# Patient Record
Sex: Male | Born: 1965 | Race: White | Hispanic: No | Marital: Single | Smoking: Never smoker
Health system: Southern US, Community
[De-identification: ages and names within clinical notes are randomized; demographics above are authoritative.]

## PROBLEM LIST (undated history)

## (undated) DIAGNOSIS — I4892 Unspecified atrial flutter: Secondary | ICD-10-CM

## (undated) DIAGNOSIS — I219 Acute myocardial infarction, unspecified: Secondary | ICD-10-CM

## (undated) DIAGNOSIS — M109 Gout, unspecified: Secondary | ICD-10-CM

## (undated) DIAGNOSIS — I10 Essential (primary) hypertension: Secondary | ICD-10-CM

## (undated) DIAGNOSIS — E139 Other specified diabetes mellitus without complications: Secondary | ICD-10-CM

## (undated) HISTORY — DX: Unspecified atrial flutter: I48.92

---

## 2018-12-29 ENCOUNTER — Emergency Department: Payer: BLUE CROSS/BLUE SHIELD

## 2018-12-29 ENCOUNTER — Inpatient Hospital Stay
Admission: EM | Admit: 2018-12-29 | Discharge: 2019-01-01 | DRG: 501 | Disposition: A | Discharge status: Home / Self Care | Payer: BLUE CROSS/BLUE SHIELD | Source: Ambulatory Visit | Attending: Internal Medicine | Admitting: Internal Medicine

## 2018-12-29 ENCOUNTER — Other Ambulatory Visit: Payer: Self-pay

## 2018-12-29 ENCOUNTER — Encounter: Payer: Self-pay | Admitting: Emergency Medicine

## 2018-12-29 ENCOUNTER — Inpatient Hospital Stay: Payer: BLUE CROSS/BLUE SHIELD

## 2018-12-29 DIAGNOSIS — M25422 Effusion, left elbow: Secondary | ICD-10-CM | POA: Diagnosis present

## 2018-12-29 DIAGNOSIS — Z833 Family history of diabetes mellitus: Secondary | ICD-10-CM | POA: Diagnosis not present

## 2018-12-29 DIAGNOSIS — Z888 Allergy status to other drugs, medicaments and biological substances status: Secondary | ICD-10-CM

## 2018-12-29 DIAGNOSIS — L0390 Cellulitis, unspecified: Secondary | ICD-10-CM | POA: Diagnosis present

## 2018-12-29 DIAGNOSIS — M7022 Olecranon bursitis, left elbow: Secondary | ICD-10-CM | POA: Diagnosis not present

## 2018-12-29 DIAGNOSIS — E119 Type 2 diabetes mellitus without complications: Secondary | ICD-10-CM | POA: Diagnosis not present

## 2018-12-29 DIAGNOSIS — M10022 Idiopathic gout, left elbow: Secondary | ICD-10-CM | POA: Diagnosis present

## 2018-12-29 DIAGNOSIS — I10 Essential (primary) hypertension: Secondary | ICD-10-CM | POA: Diagnosis present

## 2018-12-29 DIAGNOSIS — L02414 Cutaneous abscess of left upper limb: Secondary | ICD-10-CM | POA: Diagnosis present

## 2018-12-29 DIAGNOSIS — Z7984 Long term (current) use of oral hypoglycemic drugs: Secondary | ICD-10-CM | POA: Diagnosis not present

## 2018-12-29 DIAGNOSIS — L03114 Cellulitis of left upper limb: Secondary | ICD-10-CM | POA: Diagnosis not present

## 2018-12-29 HISTORY — DX: Other specified diabetes mellitus without complications: E13.9

## 2018-12-29 HISTORY — DX: Gout, unspecified: M10.9

## 2018-12-29 HISTORY — DX: Essential (primary) hypertension: I10

## 2018-12-29 LAB — CBC
HCT: 37.5 % — ABNORMAL LOW (ref 39.0–52.0)
Hemoglobin: 12.7 g/dL — ABNORMAL LOW (ref 13.0–17.0)
MCH: 29 pg (ref 26.0–34.0)
MCHC: 33.9 g/dL (ref 30.0–36.0)
MCV: 85.6 fL (ref 80.0–100.0)
Platelets: 266 10*3/uL (ref 150–400)
RBC: 4.38 MIL/uL (ref 4.22–5.81)
RDW: 12.6 % (ref 11.5–15.5)
WBC: 8.6 10*3/uL (ref 4.0–10.5)
nRBC: 0 % (ref 0.0–0.2)

## 2018-12-29 LAB — BASIC METABOLIC PANEL
Anion gap: 13 (ref 5–15)
BUN: 15 mg/dL (ref 6–20)
CO2: 24 mmol/L (ref 22–32)
Calcium: 8.7 mg/dL — ABNORMAL LOW (ref 8.9–10.3)
Chloride: 96 mmol/L — ABNORMAL LOW (ref 98–111)
Creatinine, Ser: 1.15 mg/dL (ref 0.61–1.24)
GFR calc Af Amer: >60 mL/min (ref >60)
GFR calc non Af Amer: >60 mL/min (ref >60)
Glucose, Bld: 202 mg/dL — ABNORMAL HIGH (ref 70–99)
Potassium: 4.1 mmol/L (ref 3.5–5.1)
Sodium: 133 mmol/L — ABNORMAL LOW (ref 135–145)

## 2018-12-29 LAB — SEDIMENTATION RATE: Sed Rate: 88 mm/hr — ABNORMAL HIGH (ref 0–20)

## 2018-12-29 LAB — PROTIME-INR
INR: 1 (ref 0.8–1.2)
Prothrombin Time: 13.3 seconds (ref 11.4–15.2)

## 2018-12-29 LAB — LACTIC ACID, PLASMA
Lactic Acid, Venous: 0.9 mmol/L (ref 0.5–1.9)
Lactic Acid, Venous: 1 mmol/L (ref 0.5–1.9)

## 2018-12-29 LAB — SYNOVIAL CELL COUNT + DIFF, W/ CRYSTALS
Eosinophils-Synovial: 0 %
Lymphocytes-Synovial Fld: 6 %
Monocyte-Macrophage-Synovial Fluid: 3 %
Neutrophil, Synovial: 91 %
WBC, Synovial: 28819 /mm3 — ABNORMAL HIGH (ref 0–200)

## 2018-12-29 LAB — URIC ACID: Uric Acid, Serum: 6.3 mg/dL (ref 3.7–8.6)

## 2018-12-29 MED ORDER — ALLOPURINOL 100 MG PO TABS
100.0000 mg | ORAL_TABLET | Freq: Every day | ORAL | Status: DC
  Administered 2018-12-29 – 2019-01-01 (×4): 100 mg via ORAL
  Filled 2018-12-29 (×4): qty 1

## 2018-12-29 MED ORDER — ONDANSETRON HCL 4 MG PO TABS: 4.0000 mg | ORAL_TABLET | Freq: Four times a day (QID) | ORAL | Status: DC | PRN

## 2018-12-29 MED ORDER — SODIUM CHLORIDE 0.9% FLUSH: 3.0000 mL | INTRAVENOUS | Status: DC | PRN

## 2018-12-29 MED ORDER — SODIUM CHLORIDE 0.9 % IV BOLUS
1000.0000 mL | Freq: Once | INTRAVENOUS | Status: AC
  Administered 2018-12-29: 1000 mL via INTRAVENOUS

## 2018-12-29 MED ORDER — MORPHINE SULFATE (PF) 4 MG/ML IV SOLN
4.0000 mg | Freq: Once | INTRAVENOUS | Status: AC
  Administered 2018-12-29: 4 mg via INTRAVENOUS
  Filled 2018-12-29: qty 1

## 2018-12-29 MED ORDER — LIDOCAINE HCL (PF) 1 % IJ SOLN
5.0000 mL | Freq: Once | INTRAMUSCULAR | Status: AC
  Administered 2018-12-29: 5 mL
  Filled 2018-12-29: qty 5

## 2018-12-29 MED ORDER — VANCOMYCIN HCL IN DEXTROSE 1-5 GM/200ML-% IV SOLN
1000.0000 mg | Freq: Once | INTRAVENOUS | Status: AC
  Administered 2018-12-29: 1000 mg via INTRAVENOUS
  Filled 2018-12-29: qty 200

## 2018-12-29 MED ORDER — METHYLPREDNISOLONE SODIUM SUCC 125 MG IJ SOLR
60.0000 mg | Freq: Every day | INTRAMUSCULAR | Status: DC
  Administered 2018-12-29 – 2018-12-30 (×2): 60 mg via INTRAVENOUS
  Filled 2018-12-29 (×2): qty 2

## 2018-12-29 MED ORDER — ACETAMINOPHEN 650 MG RE SUPP: 650.0000 mg | Freq: Four times a day (QID) | RECTAL | Status: DC | PRN

## 2018-12-29 MED ORDER — GADOBUTROL 1 MMOL/ML IV SOLN
10.0000 mL | Freq: Once | INTRAVENOUS | Status: AC | PRN
  Administered 2018-12-29: 10 mL via INTRAVENOUS
  Filled 2018-12-29: qty 10

## 2018-12-29 MED ORDER — SODIUM CHLORIDE 0.9 % IV SOLN
250.0000 mL | INTRAVENOUS | Status: DC | PRN
  Administered 2018-12-30: 05:00:00 250 mL via INTRAVENOUS
  Administered 2018-12-30: 13:00:00 via INTRAVENOUS

## 2018-12-29 MED ORDER — ACETAMINOPHEN 325 MG PO TABS: 650.0000 mg | ORAL_TABLET | Freq: Four times a day (QID) | ORAL | Status: DC | PRN

## 2018-12-29 MED ORDER — HYDROCODONE-ACETAMINOPHEN 5-325 MG PO TABS
1.0000 | ORAL_TABLET | ORAL | Status: DC | PRN
  Administered 2018-12-29: 1 via ORAL
  Administered 2018-12-30 – 2019-01-01 (×6): 2 via ORAL
  Filled 2018-12-29 (×4): qty 2
  Filled 2018-12-29: qty 1
  Filled 2018-12-29 (×2): qty 2

## 2018-12-29 MED ORDER — ONDANSETRON HCL 4 MG/2ML IJ SOLN: 4.0000 mg | Freq: Four times a day (QID) | INTRAMUSCULAR | Status: DC | PRN

## 2018-12-29 MED ORDER — ONDANSETRON HCL 4 MG/2ML IJ SOLN
4.0000 mg | Freq: Once | INTRAMUSCULAR | Status: AC
  Administered 2018-12-29: 4 mg via INTRAVENOUS
  Filled 2018-12-29: qty 2

## 2018-12-29 MED ORDER — SENNOSIDES-DOCUSATE SODIUM 8.6-50 MG PO TABS: 1.0000 | ORAL_TABLET | Freq: Every evening | ORAL | Status: DC | PRN

## 2018-12-29 MED ORDER — VANCOMYCIN HCL IN DEXTROSE 1-5 GM/200ML-% IV SOLN
1000.0000 mg | Freq: Two times a day (BID) | INTRAVENOUS | Status: DC
  Administered 2018-12-29: 1000 mg via INTRAVENOUS
  Filled 2018-12-29 (×2): qty 200

## 2018-12-29 MED ORDER — ENOXAPARIN SODIUM 40 MG/0.4ML ~~LOC~~ SOLN
40.0000 mg | SUBCUTANEOUS | Status: DC
  Administered 2018-12-29 – 2018-12-31 (×3): 40 mg via SUBCUTANEOUS
  Filled 2018-12-29 (×3): qty 0.4

## 2018-12-29 MED ORDER — SODIUM CHLORIDE 0.9% FLUSH
3.0000 mL | Freq: Two times a day (BID) | INTRAVENOUS | Status: DC
  Administered 2018-12-29 – 2018-12-31 (×3): 3 mL via INTRAVENOUS

## 2018-12-29 MED ORDER — VANCOMYCIN HCL IN DEXTROSE 1-5 GM/200ML-% IV SOLN: 1000.0000 mg | Freq: Two times a day (BID) | INTRAVENOUS | Status: DC

## 2018-12-29 MED ORDER — MORPHINE SULFATE (PF) 2 MG/ML IV SOLN
2.0000 mg | INTRAVENOUS | Status: DC | PRN
  Administered 2018-12-29: 2 mg via INTRAVENOUS
  Filled 2018-12-29: qty 1

## 2018-12-29 NOTE — Consult Note (Addendum)
Pharmacy Antibiotic Note  Joshua Holder is a 53 y.o. male admitted on 12/29/2018 with cellulitis (elbow infection)    Pharmacy has been consulted for Vancomycin dosing.  Patient will receive a total of 2g loading dose, followed by  Plan: Vancomycin 1000 mg IV Q 12 hrs. Goal AUC 400-550. Expected AUC: 456 SCr used: 1.15   Height: 5\' 11"  (180.3 cm) Weight: 270 lb (122.5 kg) IBW/kg (Calculated) : 75.3  Temp (24hrs), Avg:98.8 F (37.1 C), Min:98.8 F (37.1 C), Max:98.8 F (37.1 C)  Recent Labs  Lab 12/29/18 1648  WBC 8.6  CREATININE 1.15  LATICACIDVEN 0.9    Estimated Creatinine Clearance: 101.3 mL/min (by C-G formula based on SCr of 1.15 mg/dL).    Allergies  Allergen Reactions  . Lisinopril Swelling and Cough    Antimicrobials this admission: Vancomycin 4/20 >>  Dose adjustments this admission: None  Microbiology results: 4/20 BCx: pending  Thank you for allowing pharmacy to be a part of this patient's care.  Albina Billet, PharmD, BCPS Clinical Pharmacist 12/29/2018 6:08 PM

## 2018-12-29 NOTE — Progress Notes (Signed)
Admission from ED. Alert, oriented. Explained admission process. Oriented to room and controls. Understands NPO after 63mn.

## 2018-12-29 NOTE — Progress Notes (Signed)
Advanced care plan. Purpose of the Encounter: CODE STATUS Parties in Attendance: Patient Patient's Decision Capacity: Good Subjective/Patient's story:  Joshua Holder  is a 53 y.o. male with a known history of gout, hypertension presented to the emergency room with pain in the left elbow going on for couple of days.  Patient has some attacks of gout in the past Objective/Medical story Patient is red and swollen left elbow.  Needs evaluation for septic arthritis versus gout flare up.  Needs orthopedic surgery evaluation.  Needs IV antibiotics Goals of care determination:  Advance care directives goals of care and treatment plan discussed Patient wants everything done which includes CPR, intubation ventilator if the need arises CODE STATUS: Full code Time spent discussing advanced care planning: 16 minutes

## 2018-12-29 NOTE — H&P (Addendum)
[facility] [place] [facility]   PATIENT NAME: Joshua Holder    MR#:  [id number]  DATE OF BIRTH:  [birth year]  DATE OF ADMISSION:  12/29/2018  PRIMARY CARE PHYSICIAN: System, Provider Not In   REQUESTING/REFERRING PHYSICIAN:   CHIEF COMPLAINT:   Chief Complaint  Patient presents with  . Arm Pain    HISTORY OF PRESENT ILLNESS: Joshua Holder  is a 53 y.o. male with a known history of gout, hypertension presented to the emergency room with pain in the left elbow going on for couple of days.  Patient has some attacks of gout in the past.  He has swelling and redness and erythema in the left elbow.  Left elbow is aching in nature 8 out of 10 on a scale of 1-10.  He is currently on oral steroids and allopurinol for treatment of gout.  He was also started on oral Septra for possible olecranon bursitis.  He was referred to ER by orthopedic attending from the emergency room.  Patient had arthrocentesis of the left elbow joint by ER physician. No complains of any fever, shortness of breath, cough, sore throat.  No recent travel.  PAST MEDICAL HISTORY:   Past Medical History:  Diagnosis Date  . Gout   . Hypertension     PAST SURGICAL HISTORY: Leg surgery  SOCIAL HISTORY:  No history of smoking Drinks beer occasionally No history of illicit drug use  FAMILY HISTORY: Father deceased Mother has neuropathy and diabetes mellitus  DRUG ALLERGIES:  Allergies  Allergen Reactions  . Lisinopril Swelling and Cough    REVIEW OF SYSTEMS:   CONSTITUTIONAL: No fever, fatigue or weakness.  EYES: No blurred or double vision.  EARS, NOSE, AND THROAT: No tinnitus or ear pain.  RESPIRATORY: No cough, shortness of breath, wheezing or hemoptysis.  CARDIOVASCULAR: No chest pain, orthopnea, edema.  GASTROINTESTINAL: No nausea, vomiting, diarrhea or abdominal pain.  GENITOURINARY: No dysuria, hematuria.  ENDOCRINE: No polyuria, nocturia,  HEMATOLOGY: No anemia, easy  bruising or bleeding SKIN: No rash or lesion. MUSCULOSKELETAL: Left elbow pain noted NEUROLOGIC: No tingling, numbness, weakness.  PSYCHIATRY: No anxiety or depression.   MEDICATIONS AT HOME:  Prior to Admission medications   Not on File      PHYSICAL EXAMINATION:   VITAL SIGNS: Blood pressure (!) 135/91, pulse 86, temperature 98.8 F (37.1 C), temperature source Oral, resp. rate 20, height 5\' 11"  (1.803 m), weight 122.5 kg, SpO2 97 %.  GENERAL:  53 y.o.-year-old patient lying in the bed with no acute distress.  EYES: Pupils equal, round, reactive to light and accommodation. No scleral icterus. Extraocular muscles intact.  HEENT: Head atraumatic, normocephalic. Oropharynx and nasopharynx clear.  NECK:  Supple, no jugular venous distention. No thyroid enlargement, no tenderness.  LUNGS: Normal breath sounds bilaterally, no wheezing, rales,rhonchi or crepitation. No use of accessory muscles of respiration.  CARDIOVASCULAR: S1, S2 normal. No murmurs, rubs, or gallops.  ABDOMEN: Soft, nontender, nondistended. Bowel sounds present. No organomegaly or mass.  EXTREMITIES: No pedal edema, cyanosis, or clubbing.  Left elbow redness noted Swelling and tenderness left elbow noted NEUROLOGIC: Cranial nerves II through XII are intact. Muscle strength 5/5 in all extremities. Sensation intact. Gait not checked.  PSYCHIATRIC: The patient is alert and oriented x 3.  SKIN: redness of skin left elbow noted.     LABORATORY PANEL:   CBC Recent Labs  Lab 12/29/18 1648  WBC 8.6  HGB 12.7*  HCT 37.5*  PLT 266  MCV 85.6  MCH 29.0  MCHC 33.9  RDW 12.6   ------------------------------------------------------------------------------------------------------------------  Chemistries  Recent Labs  Lab 12/29/18 1648  NA 133*  K 4.1  CL 96*  CO2 24  GLUCOSE 202*  BUN 15  CREATININE 1.15  CALCIUM 8.7*    ------------------------------------------------------------------------------------------------------------------ estimated creatinine clearance is 101.3 mL/min (by C-G formula based on SCr of 1.15 mg/dL). ------------------------------------------------------------------------------------------------------------------ No results for input(s): TSH, T4TOTAL, T3FREE, THYROIDAB in the last 72 hours.  Invalid input(s): FREET3   Coagulation profile Recent Labs  Lab 12/29/18 1720  INR 1.0   ------------------------------------------------------------------------------------------------------------------- No results for input(s): DDIMER in the last 72 hours. -------------------------------------------------------------------------------------------------------------------  Cardiac Enzymes No results for input(s): CKMB, TROPONINI, MYOGLOBIN in the last 168 hours.  Invalid input(s): CK ------------------------------------------------------------------------------------------------------------------ Invalid input(s): POCBNP  ---------------------------------------------------------------------------------------------------------------  Urinalysis No results found for: COLORURINE, APPEARANCEUR, LABSPEC, PHURINE, GLUCOSEU, HGBUR, BILIRUBINUR, KETONESUR, PROTEINUR, UROBILINOGEN, NITRITE, LEUKOCYTESUR   RADIOLOGY: Dg Elbow Complete Left  Result Date: 12/29/2018 CLINICAL DATA:  Right arm pain for 5 days. Patient diagnosed with gout 5 days ago. EXAM: LEFT ELBOW - COMPLETE 3+ VIEW COMPARISON:  None. FINDINGS: Soft tissue edema identified in the elbow. A joint effusion is noted with displacement of the anterior posterior fat pads. No fractures are seen. IMPRESSION: Joint effusion in the elbow, likely secondary to the patient's reported gout. Soft tissue edema. No fractures or bony erosion noted. Electronically Signed   By: Gerome Sam III M.D   On: 12/29/2018 17:10    EKG: No orders found  for this or any previous visit.  IMPRESSION AND PLAN: 53 year old male patient with a known history of gout, hypertension presented to the emergency room with pain in the left elbow going on for couple of days.  Patient has history of gout.  -Left elbow cellulitis Versus gout flareup versus septic arthritis Patient had a tap of the left elbow joint in the emergency room Follow-up arthrocentesis fluid results Start patient on IV vancomycin antibiotic Orthopedic surgery evaluation Follow-up cultures Follow-up MRI of the elbow  -Gout Resume allopurinol IV Solu-Medrol 60 mg daily for possible flare up  -Left elbow pain Control pain with oral narcotics and PRN IV morphine  -DVT prophylaxis subcu Lovenox daily  All the records are reviewed and case discussed with ED provider. Management plans discussed with the patient, family and they are in agreement.  CODE STATUS:Full code  TOTAL TIME TAKING CARE OF THIS PATIENT:52 minutes.    [name] M.D on 12/29/2018 at 5:57 PM  Between 7am to 6pm - Pager - [phone]  After 6pm go to [email or web address] - password [email or web address]  [facility]  Office  [phone]  CC: Primary care physician; System, Provider Not In

## 2018-12-29 NOTE — ED Notes (Signed)
Patient transported to MRI 

## 2018-12-29 NOTE — ED Triage Notes (Signed)
R arm pain x 5 days. States diagnosed with gout 5 days ago, on prednisone but not improving.

## 2018-12-29 NOTE — ED Provider Notes (Signed)
[facility] Emergency Department Provider Note  ____________________________________________  Time seen: Approximately 4:19 PM  I have reviewed the triage vital signs and the nursing notes.   HISTORY  Chief Complaint Arm Pain    HPI Joshua Holder is a 53 y.o. male who presents the emergency department at referral of orthopedics for evaluation of left elbow pain, edema, erythema.  Patient reports that he has a longstanding history of gout, was seen by orthopedics approximately a week ago and placed on prednisone taper as well as antibiotics as there was some suspicion for a beginning septic joint.  Patient reports that the prednisone seems to have improved his range of motion slightly but the erythema, edema, pain has been increasing even with both medications.  Patient has been referred to the emergency department for evaluation of likely septic joint.  Conversation with orthopedic provider, Dr. Martha Clan with my colleague Dr. Lenard Lance and hospitalist Dr. Tobi Bastos occurred while I was in the room evaluating the patient.  Patient will be admitted for IV antibiotics with work-up initiated in the emergency department.  Patient denies any headache, neck pain or stiffness, chest pain, shortness of breath, fevers or chills, nausea or vomiting.  No other complaint other than worsening left elbow pain, erythema and edema.         Past Medical History:  Diagnosis Date  . Gout   . Hypertension     Patient Active Problem List   Diagnosis Date Noted  . Cellulitis 12/29/2018    History reviewed. No pertinent surgical history.  Prior to Admission medications   Not on File    Allergies Lisinopril  No family history on file.  Social History Social History   Tobacco Use  . Smoking status: Not on file  Substance Use Topics  . Alcohol use: Not on file  . Drug use: Not on file     Review of Systems  Constitutional: No fever/chills Eyes: No visual  changes. No discharge ENT: No upper respiratory complaints. Cardiovascular: no chest pain. Respiratory: no cough. No SOB. Gastrointestinal: No abdominal pain.  No nausea, no vomiting.   Musculoskeletal: Positive for left elbow pain, edema. Skin: Negative for rash, abrasions, lacerations, ecchymosis. Neurological: Negative for headaches, focal weakness or numbness. 10-point ROS otherwise negative.  ____________________________________________   PHYSICAL EXAM:  VITAL SIGNS: ED Triage Vitals  Enc Vitals Group     BP 12/29/18 1556 (!) 135/91     Pulse Rate 12/29/18 1554 86     Resp 12/29/18 1554 20     Temp 12/29/18 1554 98.8 F (37.1 C)     Temp Source 12/29/18 1554 Oral     SpO2 12/29/18 1554 97 %     Weight 12/29/18 1554 270 lb (122.5 kg)     Height 12/29/18 1554 5\' 11"  (1.803 m)     Head Circumference --      Peak Flow --      Pain Score 12/29/18 1554 8     Pain Loc --      Pain Edu? --      Excl. in GC? --      Constitutional: Alert and oriented. Well appearing and in no acute distress. Eyes: Conjunctivae are normal. PERRL. EOMI. Head: Atraumatic. Neck: No stridor.    Cardiovascular: Normal rate, regular rhythm. Normal S1 and S2.  Good peripheral circulation. Respiratory: Normal respiratory effort without tachypnea or retractions. Lungs CTAB. Good air entry to the bases with no decreased or absent breath sounds. Musculoskeletal: Full range  of motion to all extremities. No gross deformities appreciated.  Visualization of the left elbow reveals gross erythema, edema.  Patient has limited range of motion of the elbow at this time.  He has very minimal extension and flexion of the elbow.  Area is very tender to palpation, warm to palpation.  Positive for ballottement around the elbow joint.  Erythema extends from just proximal to the elbow joint to mid forearm.  Area is very tender to palpation.  Radial pulse intact distally.  Sensation intact distally. Neurologic:  Normal  speech and language. No gross focal neurologic deficits are appreciated.  Skin:  Skin is warm, dry and intact. No rash noted. Psychiatric: Mood and affect are normal. Speech and behavior are normal. Patient exhibits appropriate insight and judgement.   ____________________________________________   LABS (all labs ordered are listed, but only abnormal results are displayed)  Labs Reviewed  CBC - Abnormal; Notable for the following components:      Result Value   Hemoglobin 12.7 (*)    HCT 37.5 (*)    All other components within normal limits  BASIC METABOLIC PANEL - Abnormal; Notable for the following components:   Sodium 133 (*)    Chloride 96 (*)    Glucose, Bld 202 (*)    Calcium 8.7 (*)    All other components within normal limits  SEDIMENTATION RATE - Abnormal; Notable for the following components:   Sed Rate 88 (*)    All other components within normal limits  CULTURE, BLOOD (SINGLE)  BODY FLUID CULTURE  URIC ACID  LACTIC ACID, PLASMA  PROTIME-INR  SYNOVIAL CELL COUNT + DIFF, W/ CRYSTALS  LACTIC ACID, PLASMA  C-REACTIVE PROTEIN   ____________________________________________  EKG   ____________________________________________  RADIOLOGY I personally viewed and evaluated these images as part of my medical decision making, as well as reviewing the written report by the radiologist.  Dg Elbow Complete Left  Result Date: 12/29/2018 CLINICAL DATA:  Right arm pain for 5 days. Patient diagnosed with gout 5 days ago. EXAM: LEFT ELBOW - COMPLETE 3+ VIEW COMPARISON:  None. FINDINGS: Soft tissue edema identified in the elbow. A joint effusion is noted with displacement of the anterior posterior fat pads. No fractures are seen. IMPRESSION: Joint effusion in the elbow, likely secondary to the patient's reported gout. Soft tissue edema. No fractures or bony erosion noted. Electronically Signed   By: Gerome Sam III M.D   On: 12/29/2018 17:10     ____________________________________________    PROCEDURES  Procedure(s) performed:    .Joint Aspiration/Arthrocentesis Date/Time: 12/29/2018 5:34 PM Performed by: Racheal Patches, PA-C Authorized by: Racheal Patches, PA-C   Consent:    Consent obtained:  Verbal   Consent given by:  Patient   Risks discussed:  Incomplete drainage and pain Location:    Location:  Elbow   Elbow:  L elbow Anesthesia (see MAR for exact dosages):    Anesthesia method:  Local infiltration   Local anesthetic:  Lidocaine 1% w/o epi Procedure details:    Preparation: Patient was prepped and draped in usual sterile fashion     Needle gauge:  18 G   Ultrasound guidance: no     Approach:  Lateral   Aspirate amount:  5 ml   Aspirate characteristics:  Purulent and cloudy   Steroid injected: no     Specimen collected: yes   Post-procedure details:    Dressing:  Adhesive bandage   Patient tolerance of procedure:  Tolerated well, no  immediate complications      Medications  vancomycin (VANCOCIN) IVPB 1000 mg/200 mL premix (1,000 mg Intravenous New Bag/Given 12/29/18 1707)  lidocaine (PF) (XYLOCAINE) 1 % injection 5 mL (has no administration in time range)  allopurinol (ZYLOPRIM) tablet 100 mg (has no administration in time range)  methylPREDNISolone sodium succinate (SOLU-MEDROL) 125 mg/2 mL injection 60 mg (has no administration in time range)  morphine 2 MG/ML injection 2 mg (has no administration in time range)  sodium chloride 0.9 % bolus 1,000 mL (1,000 mLs Intravenous New Bag/Given 12/29/18 1706)  ondansetron (ZOFRAN) injection 4 mg (4 mg Intravenous Given 12/29/18 1659)  morphine 4 MG/ML injection 4 mg (4 mg Intravenous Given 12/29/18 1659)     ____________________________________________   INITIAL IMPRESSION / ASSESSMENT AND PLAN / ED COURSE  Pertinent labs & imaging results that were available during my care of the patient were reviewed by me and considered in my medical  decision making (see chart for details).  Review of the [place] CSRS was performed in accordance of the [facility] prior to dispensing any controlled drugs.  Clinical Course as of Dec 28 1804  Mon Dec 29, 2018  1641 Patient has been referred to the emergency department by orthopedics for likely septic joint.  Patient has been on prednisone as well as Bactrim for left elbow erythema and edema.  Elbow is grossly erythematous, edematous with limited range of motion.  Differential includes osteomyelitis, septic joint, gout.  Basic labs will be ordered at this time.  Given significant nature of erythema and edema, I will aspirate the joint for synovial fluid.  Conversations with orthopedics and hospitalist have been undertaken.  Patient will be admitted pending work-up started by emergency department.   [JC]    Clinical Course User Index [JC] Cuthriell, Delorise Royals, PA-C          Patient's diagnosis is consistent with left elbow effusion.  Patient presented to the emergency department with complaint of ongoing left elbow pain, erythema and edema.  Patient had originally seen orthopedics and was started on both steroid as well as antibiotic for gout versus developing septic joint.  His patient has not improved on these medications he was referred to the emergency department.  Patient has significant erythematous and edematous left elbow.  Orthopedics had already arranged to have patient admitted after initial work-up was began by the emergency department.  Labs, x-ray, MRI, joint aspiration will be performed.  Patient will be admitted to hospitalist service for further management.  At this time, differential includes gout, septic joint.  As there was a strong suspicion for septic joint, patient was empirically started on vancomycin and fluids.  During aspiration, no medications were injected.  Patient care will be transferred to hospitalist service for further management.        ____________________________________________  FINAL CLINICAL IMPRESSION(S) / ED DIAGNOSES  Final diagnoses:  Elbow effusion, left      NEW MEDICATIONS STARTED DURING THIS VISIT:  ED Discharge Orders    None          This chart was dictated using voice recognition software/Dragon. Despite best efforts to proofread, errors can occur which can change the meaning. Any change was purely unintentional.    Racheal Patches, PA-C 12/29/18 1806    Minna Antis, MD 12/29/18 2144

## 2018-12-29 NOTE — ED Notes (Signed)
ED TO INPATIENT HANDOFF REPORT  ED Nurse Name and Phone #: Lenord Fellers [phone]  S Name/Age/Gender Joshua Holder 53 y.o. male Room/Bed: ED54A/ED54A  Code Status   Code Status: Not on file  Home/SNF/Other Home } A/Ox4 Is this baseline? YES  Triage Complete: Triage complete  Chief Complaint Arm Infection  Triage Note R arm pain x 5 days. States diagnosed with gout 5 days ago, on prednisone but not improving.    Allergies Allergies  Allergen Reactions  . Lisinopril Swelling and Cough    Level of Care/Admitting Diagnosis ED Disposition    ED Disposition Condition Comment   Admit  Hospital Area: [facility] [[id number]]  Level of Care: Med-Surg [16]  Covid Evaluation: N/A  Diagnosis: Cellulitis [960454]  Admitting Physician: [name] [[id number]]  Attending Physician: [name] [[id number]]  Estimated length of stay: past midnight tomorrow  Certification:: I certify this patient will need inpatient services for at least 2 midnights  PT Class (Do Not Modify): Inpatient [101]  PT Acc Code (Do Not Modify): Private [1]       B Medical/Surgery History Past Medical History:  Diagnosis Date  . Gout   . Hypertension    History reviewed. No pertinent surgical history.   A IV Location/Drains/Wounds Patient Lines/Drains/Airways Status   Active Line/Drains/Airways    Name:   Placement date:   Placement time:   Site:   Days:   Peripheral IV 12/29/18 Right Antecubital   12/29/18    1653    Antecubital   less than 1          Intake/Output Last 24 hours No intake or output data in the 24 hours ending 12/29/18 1834  Labs/Imaging Results for orders placed or performed during the hospital encounter of 12/29/18 (from the past 48 hour(s))  CBC     Status: Abnormal   Collection Time: 12/29/18  4:48 PM  Result Value Ref Range   WBC 8.6 4.0 - 10.5 K/uL   RBC 4.38 4.22 - 5.81 MIL/uL   Hemoglobin 12.7 (L) 13.0 - 17.0 g/dL   HCT 56.2 (L) 13.0 - 86.5 %    MCV 85.6 80.0 - 100.0 fL   MCH 29.0 26.0 - 34.0 pg   MCHC 33.9 30.0 - 36.0 g/dL   RDW 78.4 69.6 - 29.5 %   Platelets 266 150 - 400 K/uL   nRBC 0.0 0.0 - 0.2 %    Comment: Performed at [facility], [address] [place], [place] [address]  Basic metabolic panel     Status: Abnormal   Collection Time: 12/29/18  4:48 PM  Result Value Ref Range   Sodium 133 (L) 135 - 145 mmol/L   Potassium 4.1 3.5 - 5.1 mmol/L   Chloride 96 (L) 98 - 111 mmol/L   CO2 24 22 - 32 mmol/L   Glucose, Bld 202 (H) 70 - 99 mg/dL   BUN 15 6 - 20 mg/dL   Creatinine, Ser 2.44 0.61 - 1.24 mg/dL   Calcium 8.7 (L) 8.9 - 10.3 mg/dL   GFR calc non Af Amer >60 >60 mL/min   GFR calc Af Amer >60 >60 mL/min   Anion gap 13 5 - 15    Comment: Performed at [facility], [address] [place], [place] [address]  Uric acid     Status: None   Collection Time: 12/29/18  4:48 PM  Result Value Ref Range   Uric Acid, Serum 6.3 3.7 - 8.6 mg/dL  Comment: Performed at [facility], [address] [place], [place] [address]  Lactic acid, plasma     Status: None   Collection Time: 12/29/18  4:48 PM  Result Value Ref Range   Lactic Acid, Venous 0.9 0.5 - 1.9 mmol/L    Comment: Performed at [facility], [address] [place], [place] [address]  Sedimentation rate     Status: Abnormal   Collection Time: 12/29/18  4:48 PM  Result Value Ref Range   Sed Rate 88 (H) 0 - 20 mm/hr    Comment: Performed at [facility], [address] [place], [place] [address]  Protime-INR     Status: None   Collection Time: 12/29/18  5:20 PM  Result Value Ref Range   Prothrombin Time 13.3 11.4 - 15.2 seconds   INR 1.0 0.8 - 1.2    Comment: (NOTE) INR goal varies based on device and disease states. Performed at [facility], [address] [place], [place] [address]    Dg Elbow Complete Left  Result Date: 12/29/2018 CLINICAL DATA:  Right arm pain for 5 days. Patient diagnosed  with gout 5 days ago. EXAM: LEFT ELBOW - COMPLETE 3+ VIEW COMPARISON:  None. FINDINGS: Soft tissue edema identified in the elbow. A joint effusion is noted with displacement of the anterior posterior fat pads. No fractures are seen. IMPRESSION: Joint effusion in the elbow, likely secondary to the patient's reported gout. Soft tissue edema. No fractures or bony erosion noted. Electronically Signed   By: Gerome Sam III M.D   On: 12/29/2018 17:10    Pending Labs Unresulted Labs (From admission, onward)    Start     Ordered   12/29/18 1742  Body fluid culture  Once,   STAT    Comments:  On bactrim   Question:  Patient immune status  Answer:  Normal   12/29/18 1742   12/29/18 1652  C-reactive protein  ONCE - STAT,   STAT     12/29/18 1651   12/29/18 1634  Lactic acid, plasma  Now then every 2 hours,   STAT     12/29/18 1633   12/29/18 1633  Blood culture (single)  ONCE - STAT,   STAT    Comments:  Patient with probable septic joint on opposing side.  Only one culture ordered 1 unaffected extremity at this time.    12/29/18 1633   12/29/18 1633  Synovial cell count + diff, w/ crystals  Once,   STAT     12/29/18 1633   Signed and Held  HIV antibody (Routine Testing)  Once,   R     Signed and Held   Signed and Held  CBC  (enoxaparin (LOVENOX)    CrCl >/= 30 ml/min)  Once,   R    Comments:  Baseline for enoxaparin therapy IF NOT ALREADY DRAWN.  Notify MD if PLT < 100 K.    Signed and Held   Signed and Held  Creatinine, serum  (enoxaparin (LOVENOX)    CrCl >/= 30 ml/min)  Once,   R    Comments:  Baseline for enoxaparin therapy IF NOT ALREADY DRAWN.    Signed and Held   Signed and Held  Creatinine, serum  (enoxaparin (LOVENOX)    CrCl >/= 30 ml/min)  Weekly,   R    Comments:  while on enoxaparin therapy    Signed and Held   Signed and Held  CBC  Tomorrow morning,  R     Signed and Held   Signed and Held  Basic metabolic panel  Tomorrow morning,   R     Signed and Held           Vitals/Pain Today's Vitals   12/29/18 1554 12/29/18 1556 12/29/18 1615  BP:  (!) 135/91   Pulse: 86    Resp: 20    Temp: 98.8 F (37.1 C)    TempSrc: Oral    SpO2: 97%    Weight: 122.5 kg    Height: 5\' 11"  (1.803 m)    PainSc: 8   8     Isolation Precautions No active isolations  Medications Medications  lidocaine (PF) (XYLOCAINE) 1 % injection 5 mL (has no administration in time range)  allopurinol (ZYLOPRIM) tablet 100 mg (has no administration in time range)  methylPREDNISolone sodium succinate (SOLU-MEDROL) 125 mg/2 mL injection 60 mg (has no administration in time range)  morphine 2 MG/ML injection 2 mg (has no administration in time range)  gadobutrol (GADAVIST) 1 MMOL/ML injection 10 mL (has no administration in time range)  vancomycin (VANCOCIN) IVPB 1000 mg/200 mL premix (has no administration in time range)  sodium chloride 0.9 % bolus 1,000 mL (1,000 mLs Intravenous New Bag/Given 12/29/18 1706)  vancomycin (VANCOCIN) IVPB 1000 mg/200 mL premix (1,000 mg Intravenous New Bag/Given 12/29/18 1707)  ondansetron (ZOFRAN) injection 4 mg (4 mg Intravenous Given 12/29/18 1659)  morphine 4 MG/ML injection 4 mg (4 mg Intravenous Given 12/29/18 1659)    Mobility walks Low fall risk   Focused Assessments    R Recommendations: See Admitting Provider Note  Report given to:   Additional Notes:

## 2018-12-29 NOTE — ED Notes (Signed)
Pt denies fevers at this time

## 2018-12-29 NOTE — Consult Note (Signed)
ORTHOPAEDIC CONSULTATION  REQUESTING PHYSICIAN: Minna Antis, MD  Chief Complaint: Left elbow and forearm erythema, swelling.  HPI: Joshua Holder is a 53 y.o. male was seen in the office last Wednesday with pain, erythema and swelling of the left elbow.  Patient denies recent injury.  Patient has a significant history of gout, and reports several flare-ups a year.  He stated in the office that only oral steroids help to successfully treat flare-ups of gout.   Patient had no significant pain with active motion of the elbow and no fluctuance.  Patient was prescribed an oral steroid taper with a medrol dose pack.   He was also started on Septra for possible olecranon bursitis.  Patient didn't show for his office appointment 2 days later but left a message with the front desk by phone that he was improving.     He presented to office today with increasing redness and swelling of his left elbow and forearm.  He denies fevers or chills.   He worked over the weekend and wonders whether the work causes the increased swelling.  His first work shift after being seen in the office was Friday night, which is when he states the swelling started.    Past Medical History:  Diagnosis Date  . Gout   . Hypertension    History reviewed. No pertinent surgical history. Social History   Socioeconomic History  . Marital status: Single    Spouse name: Not on file  . Number of children: Not on file  . Years of education: Not on file  . Highest education level: Not on file  Occupational History  . Not on file  Social Needs  . Financial resource strain: Not on file  . Food insecurity:    Worry: Not on file    Inability: Not on file  . Transportation needs:    Medical: Not on file    Non-medical: Not on file  Tobacco Use  . Smoking status: Not on file  Substance and Sexual Activity  . Alcohol use: Not on file  . Drug use: Not on file  . Sexual activity: Not on file  Lifestyle  . Physical  activity:    Days per week: Not on file    Minutes per session: Not on file  . Stress: Not on file  Relationships  . Social connections:    Talks on phone: Not on file    Gets together: Not on file    Attends religious service: Not on file    Active member of club or organization: Not on file    Attends meetings of clubs or organizations: Not on file    Relationship status: Not on file  Other Topics Concern  . Not on file  Social History Narrative  . Not on file   No family history on file. Allergies  Allergen Reactions  . Lisinopril Swelling   Prior to Admission medications   Not on File   No results found.  Positive ROS: All other systems have been reviewed and were otherwise negative with the exception of those mentioned in the HPI and as above.  Physical Exam: General: Alert, no acute distress  MUSCULOSKELETAL: Left upper extremity:  Patient has significant swelling and redness of the left elbow, extending into the forearm and swelling extends to the dorsal hand.  He can flex to 120 degrees and extend -45 degrees without significant pain.  He can pronate his forearm to 90 degrees and supinate to 60 degrees without  significant pain.  He has intact sensation to light touch throughout the left upper extremity.  His fingers are well perfused.  He has full digital and wrist ROM.  His forearm compartments are soft and compressible.     Assessment: Left elbow and forearm redness and swelling due to cellulitis  Plan: I sent the patient to the ER for admission for IV antibiotics.  I have discussed the case with Dr. Lenard Lance, the ER physician and Dr. Tobi Bastos, the hospitalist, who has agreed to admit the patient.   I have ordered lab work and  An MRI with and without contrast to assess for cellulitis vs. Abscess.  I will continue to follow the patient throughout his hospitalization.   Joshua Fairly, MD    12/29/2018 4:31 PM

## 2018-12-30 ENCOUNTER — Encounter
Admission: EM | Disposition: A | Discharge status: Home / Self Care | Payer: Self-pay | Source: Ambulatory Visit | Attending: Internal Medicine

## 2018-12-30 ENCOUNTER — Inpatient Hospital Stay: Payer: BLUE CROSS/BLUE SHIELD | Admitting: Anesthesiology

## 2018-12-30 ENCOUNTER — Encounter: Payer: Self-pay | Admitting: Anesthesiology

## 2018-12-30 HISTORY — PX: INCISION AND DRAINAGE: SHX5863

## 2018-12-30 LAB — GLUCOSE, CAPILLARY
Glucose-Capillary: 281 mg/dL — ABNORMAL HIGH (ref 70–99)
Glucose-Capillary: 312 mg/dL — ABNORMAL HIGH (ref 70–99)
Glucose-Capillary: 240 mg/dL — ABNORMAL HIGH (ref 70–99)
Glucose-Capillary: 209 mg/dL — ABNORMAL HIGH (ref 70–99)
Glucose-Capillary: 256 mg/dL — ABNORMAL HIGH (ref 70–99)
Glucose-Capillary: 262 mg/dL — ABNORMAL HIGH (ref 70–99)
Glucose-Capillary: 283 mg/dL — ABNORMAL HIGH (ref 70–99)
Glucose-Capillary: 277 mg/dL — ABNORMAL HIGH (ref 70–99)

## 2018-12-30 LAB — BASIC METABOLIC PANEL
Anion gap: 12 (ref 5–15)
BUN: 16 mg/dL (ref 6–20)
CO2: 23 mmol/L (ref 22–32)
Calcium: 8.7 mg/dL — ABNORMAL LOW (ref 8.9–10.3)
Chloride: 96 mmol/L — ABNORMAL LOW (ref 98–111)
Creatinine, Ser: 1.04 mg/dL (ref 0.61–1.24)
GFR calc Af Amer: >60 mL/min (ref >60)
GFR calc non Af Amer: >60 mL/min (ref >60)
Glucose, Bld: 455 mg/dL — ABNORMAL HIGH (ref 70–99)
Potassium: 4.8 mmol/L (ref 3.5–5.1)
Sodium: 131 mmol/L — ABNORMAL LOW (ref 135–145)

## 2018-12-30 LAB — CBC
HCT: 36.8 % — ABNORMAL LOW (ref 39.0–52.0)
Hemoglobin: 12.3 g/dL — ABNORMAL LOW (ref 13.0–17.0)
MCH: 29.1 pg (ref 26.0–34.0)
MCHC: 33.4 g/dL (ref 30.0–36.0)
MCV: 87.2 fL (ref 80.0–100.0)
Platelets: 265 10*3/uL (ref 150–400)
RBC: 4.22 MIL/uL (ref 4.22–5.81)
RDW: 12.4 % (ref 11.5–15.5)
WBC: 7.7 10*3/uL (ref 4.0–10.5)
nRBC: 0 % (ref 0.0–0.2)

## 2018-12-30 LAB — C-REACTIVE PROTEIN: CRP: 20.1 mg/dL — ABNORMAL HIGH (ref <1.0)

## 2018-12-30 SURGERY — INCISION AND DRAINAGE
Anesthesia: General | Site: Elbow | Laterality: Left

## 2018-12-30 MED ORDER — FENTANYL CITRATE (PF) 100 MCG/2ML IJ SOLN
INTRAMUSCULAR | Status: AC
  Filled 2018-12-30: qty 2

## 2018-12-30 MED ORDER — DEXAMETHASONE SODIUM PHOSPHATE 4 MG/ML IJ SOLN
INTRAMUSCULAR | Status: AC
  Filled 2018-12-30: qty 1

## 2018-12-30 MED ORDER — SODIUM CHLORIDE 0.9 % IV SOLN
INTRAVENOUS | Status: DC
  Administered 2018-12-30 – 2019-01-01 (×5): via INTRAVENOUS

## 2018-12-30 MED ORDER — ROCURONIUM BROMIDE 100 MG/10ML IV SOLN
INTRAVENOUS | Status: DC | PRN
  Administered 2018-12-30: 30 mg via INTRAVENOUS

## 2018-12-30 MED ORDER — GLYCOPYRROLATE 0.2 MG/ML IJ SOLN
INTRAMUSCULAR | Status: AC
  Filled 2018-12-30: qty 1

## 2018-12-30 MED ORDER — SUGAMMADEX SODIUM 200 MG/2ML IV SOLN
INTRAVENOUS | Status: DC | PRN
  Administered 2018-12-30: 250 mg via INTRAVENOUS

## 2018-12-30 MED ORDER — SUGAMMADEX SODIUM 500 MG/5ML IV SOLN
INTRAVENOUS | Status: AC
  Filled 2018-12-30: qty 5

## 2018-12-30 MED ORDER — DEXMEDETOMIDINE HCL IN NACL 200 MCG/50ML IV SOLN
INTRAVENOUS | Status: DC | PRN
  Administered 2018-12-30 (×2): 20 ug via INTRAVENOUS

## 2018-12-30 MED ORDER — CEFAZOLIN SODIUM-DEXTROSE 2-4 GM/100ML-% IV SOLN
2.0000 g | INTRAVENOUS | Status: AC
  Administered 2018-12-30: 2 g via INTRAVENOUS
  Filled 2018-12-30: qty 100

## 2018-12-30 MED ORDER — HYDRALAZINE HCL 20 MG/ML IJ SOLN: 10.0000 mg | Freq: Four times a day (QID) | INTRAMUSCULAR | Status: DC | PRN

## 2018-12-30 MED ORDER — PROPOFOL 10 MG/ML IV BOLUS
INTRAVENOUS | Status: AC
  Filled 2018-12-30: qty 20

## 2018-12-30 MED ORDER — ONDANSETRON HCL 4 MG/2ML IJ SOLN
INTRAMUSCULAR | Status: DC | PRN
  Administered 2018-12-30: 4 mg via INTRAVENOUS

## 2018-12-30 MED ORDER — SUCCINYLCHOLINE CHLORIDE 20 MG/ML IJ SOLN
INTRAMUSCULAR | Status: AC
  Filled 2018-12-30: qty 1

## 2018-12-30 MED ORDER — INSULIN ASPART 100 UNIT/ML ~~LOC~~ SOLN
4.0000 [IU] | Freq: Once | SUBCUTANEOUS | Status: AC
  Administered 2018-12-30: 4 [IU] via SUBCUTANEOUS

## 2018-12-30 MED ORDER — DEXAMETHASONE SODIUM PHOSPHATE 10 MG/ML IJ SOLN
INTRAMUSCULAR | Status: DC | PRN
  Administered 2018-12-30: 8 mg via INTRAVENOUS
  Administered 2018-12-30: 4 mg via INTRAVENOUS

## 2018-12-30 MED ORDER — GENTAMICIN SULFATE 40 MG/ML IJ SOLN
INTRAMUSCULAR | Status: AC
  Filled 2018-12-30: qty 2

## 2018-12-30 MED ORDER — ROCURONIUM BROMIDE 50 MG/5ML IV SOLN
INTRAVENOUS | Status: AC
  Filled 2018-12-30: qty 1

## 2018-12-30 MED ORDER — FENTANYL CITRATE (PF) 100 MCG/2ML IJ SOLN
INTRAMUSCULAR | Status: DC | PRN
  Administered 2018-12-30 (×3): 50 ug via INTRAVENOUS
  Administered 2018-12-30: 100 ug via INTRAVENOUS
  Administered 2018-12-30: 50 ug via INTRAVENOUS

## 2018-12-30 MED ORDER — OXYCODONE HCL 5 MG PO TABS
5.0000 mg | ORAL_TABLET | Freq: Once | ORAL | Status: AC
  Administered 2018-12-30: 5 mg via ORAL

## 2018-12-30 MED ORDER — GLYCOPYRROLATE 0.2 MG/ML IJ SOLN
INTRAMUSCULAR | Status: DC | PRN
  Administered 2018-12-30: 0.2 mg via INTRAVENOUS

## 2018-12-30 MED ORDER — INSULIN ASPART 100 UNIT/ML ~~LOC~~ SOLN
0.0000 [IU] | Freq: Every day | SUBCUTANEOUS | Status: DC
  Administered 2018-12-30: 3 [IU] via SUBCUTANEOUS
  Administered 2018-12-31: 22:00:00 2 [IU] via SUBCUTANEOUS
  Filled 2018-12-30 (×2): qty 1

## 2018-12-30 MED ORDER — INSULIN ASPART 100 UNIT/ML ~~LOC~~ SOLN
0.0000 [IU] | Freq: Three times a day (TID) | SUBCUTANEOUS | Status: DC
  Administered 2018-12-30: 11 [IU] via SUBCUTANEOUS
  Administered 2018-12-30: 15 [IU] via SUBCUTANEOUS
  Administered 2018-12-31: 11 [IU] via SUBCUTANEOUS
  Administered 2018-12-31: 15 [IU] via SUBCUTANEOUS
  Administered 2018-12-31: 4 [IU] via SUBCUTANEOUS
  Administered 2019-01-01: 08:00:00 3 [IU] via SUBCUTANEOUS
  Filled 2018-12-30 (×6): qty 1

## 2018-12-30 MED ORDER — OXYCODONE HCL 5 MG PO TABS
ORAL_TABLET | ORAL | Status: AC
  Filled 2018-12-30: qty 1

## 2018-12-30 MED ORDER — INSULIN REGULAR HUMAN 100 UNIT/ML IJ SOLN
10.0000 [IU] | Freq: Once | INTRAMUSCULAR | Status: AC
  Administered 2018-12-30: 10 [IU] via INTRAVENOUS
  Filled 2018-12-30: qty 10

## 2018-12-30 MED ORDER — MIDAZOLAM HCL 2 MG/2ML IJ SOLN
INTRAMUSCULAR | Status: DC | PRN
  Administered 2018-12-30: 2 mg via INTRAVENOUS

## 2018-12-30 MED ORDER — FENTANYL CITRATE (PF) 100 MCG/2ML IJ SOLN
INTRAMUSCULAR | Status: AC
  Administered 2018-12-30: 25 ug via INTRAVENOUS
  Filled 2018-12-30: qty 2

## 2018-12-30 MED ORDER — SUCCINYLCHOLINE CHLORIDE 20 MG/ML IJ SOLN
INTRAMUSCULAR | Status: DC | PRN
  Administered 2018-12-30: 100 mg via INTRAVENOUS

## 2018-12-30 MED ORDER — LIDOCAINE HCL (PF) 2 % IJ SOLN
INTRAMUSCULAR | Status: AC
  Filled 2018-12-30: qty 10

## 2018-12-30 MED ORDER — SODIUM CHLORIDE 0.9 % IV SOLN
INTRAVENOUS | Status: DC | PRN
  Administered 2018-12-30 (×3): 500 mL

## 2018-12-30 MED ORDER — FENTANYL CITRATE (PF) 100 MCG/2ML IJ SOLN
25.0000 ug | INTRAMUSCULAR | Status: AC | PRN
  Administered 2018-12-30 (×6): 25 ug via INTRAVENOUS

## 2018-12-30 MED ORDER — ONDANSETRON HCL 4 MG/2ML IJ SOLN
INTRAMUSCULAR | Status: AC
  Filled 2018-12-30: qty 2

## 2018-12-30 MED ORDER — LIDOCAINE HCL (CARDIAC) PF 100 MG/5ML IV SOSY
PREFILLED_SYRINGE | INTRAVENOUS | Status: DC | PRN
  Administered 2018-12-30: 50 mg via INTRAVENOUS

## 2018-12-30 MED ORDER — CEFAZOLIN SODIUM-DEXTROSE 2-4 GM/100ML-% IV SOLN
INTRAVENOUS | Status: AC
  Filled 2018-12-30: qty 100

## 2018-12-30 MED ORDER — DEXAMETHASONE SODIUM PHOSPHATE 4 MG/ML IJ SOLN
INTRAMUSCULAR | Status: AC
  Filled 2018-12-30: qty 2

## 2018-12-30 MED ORDER — VANCOMYCIN HCL 10 G IV SOLR
1250.0000 mg | Freq: Two times a day (BID) | INTRAVENOUS | Status: DC
  Administered 2018-12-30 – 2018-12-31 (×4): 1250 mg via INTRAVENOUS
  Filled 2018-12-30 (×6): qty 1250

## 2018-12-30 MED ORDER — MIDAZOLAM HCL 2 MG/2ML IJ SOLN
INTRAMUSCULAR | Status: AC
  Filled 2018-12-30: qty 2

## 2018-12-30 MED ORDER — OXYCODONE HCL 5 MG PO TABS
ORAL_TABLET | ORAL | Status: AC
  Administered 2018-12-30: 5 mg via ORAL
  Filled 2018-12-30: qty 1

## 2018-12-30 MED ORDER — PROPOFOL 10 MG/ML IV BOLUS
INTRAVENOUS | Status: DC | PRN
  Administered 2018-12-30: 200 mg via INTRAVENOUS

## 2018-12-30 MED ORDER — DEXMEDETOMIDINE HCL IN NACL 200 MCG/50ML IV SOLN
INTRAVENOUS | Status: AC
  Filled 2018-12-30: qty 50

## 2018-12-30 MED ORDER — OXYCODONE HCL 5 MG PO TABS
5.0000 mg | ORAL_TABLET | Freq: Once | ORAL | Status: AC
  Administered 2018-12-30: 16:00:00 5 mg via ORAL

## 2018-12-30 MED ORDER — INSULIN ASPART 100 UNIT/ML ~~LOC~~ SOLN
SUBCUTANEOUS | Status: AC
  Administered 2018-12-30: 4 [IU] via SUBCUTANEOUS
  Filled 2018-12-30: qty 1

## 2018-12-30 MED ORDER — ONDANSETRON HCL 4 MG/2ML IJ SOLN: 4.0000 mg | Freq: Once | INTRAMUSCULAR | Status: DC | PRN

## 2018-12-30 MED ORDER — INSULIN GLARGINE 100 UNIT/ML ~~LOC~~ SOLN
20.0000 [IU] | Freq: Once | SUBCUTANEOUS | Status: DC
  Filled 2018-12-30: qty 0.2

## 2018-12-30 SURGICAL SUPPLY — 55 items
BLADE SURG 15 STRL LF DISP TIS (BLADE) ×2 IMPLANT
BLADE SURG 15 STRL SS (BLADE) ×4
BNDG COHESIVE 6X5 TAN STRL LF (GAUZE/BANDAGES/DRESSINGS) ×3 IMPLANT
CANISTER SUCT 1200ML W/VALVE (MISCELLANEOUS) ×3 IMPLANT
CNTNR SPEC 2.5X3XGRAD LEK (MISCELLANEOUS) ×1
CONT SPEC 4OZ STER OR WHT (MISCELLANEOUS) ×2
CONTAINER SPEC 2.5X3XGRAD LEK (MISCELLANEOUS) ×1 IMPLANT
COVER WAND RF STERILE (DRAPES) ×3 IMPLANT
CUFF TOURN SGL QUICK 18X4 (TOURNIQUET CUFF) IMPLANT
CUFF TOURN SGL QUICK 24 (TOURNIQUET CUFF) ×2
CUFF TRNQT CYL 24X4X16.5-23 (TOURNIQUET CUFF) ×1 IMPLANT
DRAPE INCISE IOBAN 66X60 STRL (DRAPES) ×3 IMPLANT
DRAPE SHEET LG 3/4 BI-LAMINATE (DRAPES) ×3 IMPLANT
DRAPE SURG 17X11 SM STRL (DRAPES) ×3 IMPLANT
DRAPE U-SHAPE 47X51 STRL (DRAPES) ×3 IMPLANT
DURAPREP 26ML APPLICATOR (WOUND CARE) ×9 IMPLANT
ELECT CAUTERY BLADE 6.4 (BLADE) ×3 IMPLANT
ELECT REM PT RETURN 9FT ADLT (ELECTROSURGICAL) ×3
ELECTRODE REM PT RTRN 9FT ADLT (ELECTROSURGICAL) ×1 IMPLANT
GAUZE 4X4 16PLY RFD (DISPOSABLE) ×6 IMPLANT
GAUZE SPONGE 4X4 12PLY STRL (GAUZE/BANDAGES/DRESSINGS) ×3 IMPLANT
GAUZE XEROFORM 1X8 LF (GAUZE/BANDAGES/DRESSINGS) ×3 IMPLANT
GLOVE BIOGEL PI IND STRL 9 (GLOVE) ×3 IMPLANT
GLOVE BIOGEL PI INDICATOR 9 (GLOVE) ×6
GLOVE SURG 9.0 ORTHO LTXF (GLOVE) ×9 IMPLANT
GOWN STRL REUS TWL 2XL XL LVL4 (GOWN DISPOSABLE) ×3 IMPLANT
GOWN STRL REUS W/ TWL LRG LVL3 (GOWN DISPOSABLE) ×1 IMPLANT
GOWN STRL REUS W/TWL LRG LVL3 (GOWN DISPOSABLE) ×2
HANDLE YANKAUER SUCT BULB TIP (MISCELLANEOUS) ×3 IMPLANT
HEMOVAC 400ML (MISCELLANEOUS)
KIT DRAIN HEMOVAC JP 7FR 400ML (MISCELLANEOUS) IMPLANT
KIT TURNOVER KIT A (KITS) ×3 IMPLANT
NDL SAFETY ECLIPSE 18X1.5 (NEEDLE) ×2 IMPLANT
NEEDLE FILTER BLUNT 18X 1/2SAF (NEEDLE) ×2
NEEDLE FILTER BLUNT 18X1 1/2 (NEEDLE) ×1 IMPLANT
NEEDLE HYPO 18GX1.5 SHARP (NEEDLE) ×4
NS IRRIG 500ML POUR BTL (IV SOLUTION) ×3 IMPLANT
PACK EXTREMITY ARMC (MISCELLANEOUS) ×3 IMPLANT
PAD ABD DERMACEA PRESS 5X9 (GAUZE/BANDAGES/DRESSINGS) ×6 IMPLANT
PAD CAST CTTN 4X4 STRL (SOFTGOODS) ×2 IMPLANT
PADDING CAST COTTON 4X4 STRL (SOFTGOODS) ×4
SLING ARM LRG DEEP (SOFTGOODS) ×3 IMPLANT
SPONGE LAP 18X18 RF (DISPOSABLE) ×6 IMPLANT
STAPLER SKIN PROX 35W (STAPLE) ×3 IMPLANT
SUT ETHIBOND #5 BRAIDED 30INL (SUTURE) ×3 IMPLANT
SUT TICRON 2-0 30IN 311381 (SUTURE) ×3 IMPLANT
SUT VIC AB 0 CT1 27 (SUTURE) ×2
SUT VIC AB 0 CT1 27XCR 8 STRN (SUTURE) ×1 IMPLANT
SUT VIC AB 0 CT1 36 (SUTURE) ×3 IMPLANT
SUT VIC AB 1 CTX 27 (SUTURE) ×3 IMPLANT
SUT VICRYL+ 3-0 36IN CT-1 (SUTURE) ×3 IMPLANT
SWAB DUAL CULTURE TRANS RED ST (MISCELLANEOUS) ×6 IMPLANT
SYR 10ML LL (SYRINGE) ×3 IMPLANT
SYR 3ML LL SCALE MARK (SYRINGE) ×6 IMPLANT
TAPE MICROFOAM 4IN (TAPE) ×3 IMPLANT

## 2018-12-30 NOTE — Progress Notes (Signed)
Subjective:  POST OP CHECK: Patient reports left elbow pain as marked.  Patient's RN is in his room and gave him oxycodone.    Objective:   VITALS:   Vitals:   12/30/18 1603 12/30/18 1607 12/30/18 1615 12/30/18 1631  BP:  127/89  (!) 140/103  Pulse: 77 74 (!) 128 78  Resp: 16 15 19 20   Temp:  98.1 F (36.7 C)  98.1 F (36.7 C)  TempSrc:    Oral  SpO2: 98% 97% 97% 96%  Weight:      Height:        PHYSICAL EXAM: Left upper extremity: Dressing/splint is clean and dry.  Patient can flex and extend all digits of the left hand.  His fingers are well perfused and he has a palpable radial pulse.  Patient has paresthesias in all fingers, but most evident in his thumb, index and middle fingers.  Small and ring fingers have better sensation to light touch.   LABS  Results for orders placed or performed during the hospital encounter of 12/29/18 (from the past 24 hour(s))  Protime-INR     Status: None   Collection Time: 12/29/18  5:20 PM  Result Value Ref Range   Prothrombin Time 13.3 11.4 - 15.2 seconds   INR 1.0 0.8 - 1.2  Body fluid culture     Status: None (Preliminary result)   Collection Time: 12/29/18  5:42 PM  Result Value Ref Range   Specimen Description SYNOVIAL    Special Requests LEFT ELBOW    Gram Stain      ABUNDANT WBC PRESENT,BOTH PMN AND MONONUCLEAR NO ORGANISMS SEEN Performed at [facility], [address], [place], [place] [address]    Culture PENDING    Report Status PENDING   Lactic acid, plasma     Status: None   Collection Time: 12/29/18  9:39 PM  Result Value Ref Range   Lactic Acid, Venous 1.0 0.5 - 1.9 mmol/L  C-reactive protein     Status: Abnormal   Collection Time: 12/29/18  9:39 PM  Result Value Ref Range   CRP 20.1 (H) <1.0 mg/dL  CBC     Status: Abnormal   Collection Time: 12/30/18  2:53 AM  Result Value Ref Range   WBC 7.7 4.0 - 10.5 K/uL   RBC 4.22 4.22 - 5.81 MIL/uL   Hemoglobin 12.3 (L) 13.0 - 17.0 g/dL   HCT 35.3 (L) 29.9 -  52.0 %   MCV 87.2 80.0 - 100.0 fL   MCH 29.1 26.0 - 34.0 pg   MCHC 33.4 30.0 - 36.0 g/dL   RDW 24.2 68.3 - 41.9 %   Platelets 265 150 - 400 K/uL   nRBC 0.0 0.0 - 0.2 %  Basic metabolic panel     Status: Abnormal   Collection Time: 12/30/18  2:53 AM  Result Value Ref Range   Sodium 131 (L) 135 - 145 mmol/L   Potassium 4.8 3.5 - 5.1 mmol/L   Chloride 96 (L) 98 - 111 mmol/L   CO2 23 22 - 32 mmol/L   Glucose, Bld 455 (H) 70 - 99 mg/dL   BUN 16 6 - 20 mg/dL   Creatinine, Ser 6.22 0.61 - 1.24 mg/dL   Calcium 8.7 (L) 8.9 - 10.3 mg/dL   GFR calc non Af Amer >60 >60 mL/min   GFR calc Af Amer >60 >60 mL/min   Anion gap 12 5 - 15  Glucose, capillary     Status: Abnormal  Collection Time: 12/30/18  6:18 AM  Result Value Ref Range   Glucose-Capillary 281 (H) 70 - 99 mg/dL  Glucose, capillary     Status: Abnormal   Collection Time: 12/30/18  8:12 AM  Result Value Ref Range   Glucose-Capillary 312 (H) 70 - 99 mg/dL  Glucose, capillary     Status: Abnormal   Collection Time: 12/30/18  9:51 AM  Result Value Ref Range   Glucose-Capillary 240 (H) 70 - 99 mg/dL  Glucose, capillary     Status: Abnormal   Collection Time: 12/30/18 12:36 PM  Result Value Ref Range   Glucose-Capillary 209 (H) 70 - 99 mg/dL  Glucose, capillary     Status: Abnormal   Collection Time: 12/30/18  3:08 PM  Result Value Ref Range   Glucose-Capillary 256 (H) 70 - 99 mg/dL  Glucose, capillary     Status: Abnormal   Collection Time: 12/30/18  4:08 PM  Result Value Ref Range   Glucose-Capillary 262 (H) 70 - 99 mg/dL  Glucose, capillary     Status: Abnormal   Collection Time: 12/30/18  4:28 PM  Result Value Ref Range   Glucose-Capillary 283 (H) 70 - 99 mg/dL    Dg Elbow Complete Left  Result Date: 12/29/2018 CLINICAL DATA:  Right arm pain for 5 days. Patient diagnosed with gout 5 days ago. EXAM: LEFT ELBOW - COMPLETE 3+ VIEW COMPARISON:  None. FINDINGS: Soft tissue edema identified in the elbow. A joint effusion is  noted with displacement of the anterior posterior fat pads. No fractures are seen. IMPRESSION: Joint effusion in the elbow, likely secondary to the patient's reported gout. Soft tissue edema. No fractures or bony erosion noted. Electronically Signed   By: Gerome Sam III M.D   On: 12/29/2018 17:10   Mr Elbow Left W Wo Contrast  Result Date: 12/29/2018 CLINICAL DATA:  Worsening elbow pain, redness, and swelling. History of gout. EXAM: MRI OF THE LEFT ELBOW WITHOUT AND WITH CONTRAST TECHNIQUE: Multiplanar, multisequence MR imaging of the elbow was performed before and after the administration of intravenous contrast. CONTRAST:  10 mL Gadavist intravenous contrast. COMPARISON:  Left elbow x-rays from same day. FINDINGS: TENDONS Common forearm flexor origin: Intact with normal signal. Common forearm extensor origin: Small partial tear. Biceps: Intact. Triceps: Intact with normal signal. LIGAMENTS Medial stabilizers: Grossly intact, although evaluation is limited due to patient's inability to extend the elbow. Lateral stabilizers: Grossly intact, although evaluation is limited due to patient's inability to extend the elbow. Cartilage: Preserved.  No focal chondral defect demonstrated. Joint: Large joint effusion with thick, enhancing synovitis. No intra-articular body. Cubital tunnel: Unremarkable.  The ulnar nerve appears normal. Bones: No acute or significant extra-articular osseous findings. Other: Diffuse soft tissue edema and enhancement about the elbow. Moderate amount of fluid in the olecranon bursa. Ill-defined 2.2 x 1.3 x 1.1 cm rim enhancing fluid collection in the proximal posterior forearm. Additional ill-defined 2.0 x 2.5 x 3.1 cm rim enhancing fluid collection in the posterior elbow soft tissues just inferior to the medial epicondyle. IMPRESSION: 1. Extensive cellulitis about the left elbow with small soft tissue abscesses in the proximal posterior forearm and posteromedial elbow. 2. Large elbow  joint effusion with thick, enhancing synovitis. Moderate fluid in the olecranon bursa. While these findings could be related to gout, they are more concerning for septic arthritis and olecranon bursitis until proven otherwise given the extensive soft tissue infection surrounding the elbow. 3. Small partial tear of the common forearm extensor tendon  origin. Electronically Signed   By: Obie Dredge M.D.   On: 12/29/2018 19:52    Assessment/Plan: Day of Surgery   Active Problems:   Cellulitis  Patient is stable post-op.   Paresthesias in his hand may be related to tourniquet time of 96 minutes.  Will continue to monitor.  Continue IV vancomycin.  Awaiting cultures from OR and pathology results on olecranon bursa.    Juanell Fairly , MD 12/30/2018, 4:54 PM

## 2018-12-30 NOTE — Consult Note (Signed)
Pharmacy Antibiotic Note  Joshua Holder is a 53 y.o. male admitted on 12/29/2018 with cellulitis (elbow infection). He does have a h/o gout and synovial fluid has been aspirated.  He failed outpatient Septra. Pharmacy has been consulted for Vancomycin dosing. He received a total of 2g loading dose in the evening of 12/30/18  Plan: Vancomycin 1250 mg IV Q 12 hrs. Goal AUC 400-550. Expected AUC: 525.2, T1/2 8.8h, BMI 37.2 SCr used: 1.04  Height: 5\' 11"  (180.3 cm) Weight: 267 lb (121.1 kg) IBW/kg (Calculated) : 75.3  Temp (24hrs), Avg:98.4 F (36.9 C), Min:98.2 F (36.8 C), Max:98.8 F (37.1 C)  Recent Labs  Lab 12/29/18 1648 12/29/18 2139 12/30/18 0253  WBC 8.6  --  7.7  CREATININE 1.15  --  1.04  LATICACIDVEN 0.9 1.0  --     Estimated Creatinine Clearance: 111.3 mL/min (by C-G formula based on SCr of 1.04 mg/dL).    Antimicrobials this admission: Vancomycin 4/20 >>  Microbiology results: 4/20 BCx: pending 4/20 WCx pending  Thank you for allowing pharmacy to be a part of this patient's care.  Lowella Bandy, PharmD Clinical Pharmacist 12/30/2018 7:31 AM

## 2018-12-30 NOTE — Progress Notes (Signed)
Dr. Sheryle Hail notified of 455 blood sugar. Patient said he failed to mention diabetes to the admitting MD.

## 2018-12-30 NOTE — Progress Notes (Signed)
Inpatient Diabetes Program Recommendations  AACE/ADA: New Consensus Statement on Inpatient Glycemic Control  Target Ranges:  Prepandial:   less than 140 mg/dL      Peak postprandial:   less than 180 mg/dL (1-2 hours)      Critically ill patients:  140 - 180 mg/dL  Results for KWESI, SLICER (MRN [id number]) as of 12/30/2018 08:47  Ref. Range 12/30/2018 06:18 12/30/2018 08:12  Glucose-Capillary Latest Ref Range: 70 - 99 mg/dL 110 (H) 315 (H)   Results for DENYM, MIDDAUGH (MRN [id number]) as of 12/30/2018 08:47  Ref. Range 12/29/2018 16:48 12/30/2018 02:53  Glucose Latest Ref Range: 70 - 99 mg/dL 446 (H) 286 (H)   Review of Glycemic Control  Diabetes history:  DM2 Outpatient Diabetes medications: Metformin XR 1000 mg daily Current orders for Inpatient glycemic control: Novolog 0-20 units TID with meals, Novolog 0-5 units QHS; Solumedrol 60 mg daily  Inpatient Diabetes Program Recommendations:    Insulin-Basal: If steroids are continued, please consider ordering Lantus 10 units Q24H.  HbgA1C: Please consider ordering an A1C to evaluate glycemic control over the past 2-3 months. Patient reports glucose is usually in 200's mg/dl at home (prior to steroids). Patient likely needs additional DM medications for outpatient DM control.  NOTE: In reviewing chart, noted in Care Everywhere that patient had ED visit with [facility] on 10/24/18 it was noted patient has DM2 and prescribed Metformin XR 500 mg QAM. Noted patient did not have any DM medications on home medication list but does have Prednisone 20 mg (no noted frequency) for recent gout flare.  Called patient over the phone and patient confirms that he has DM2 hx and he takes Metformin XR 1000 mg daily (added to home medication list in chart) for DM control. Patient notes that glucose is usually in the 200's mg/dl (without steroids) and higher since starting on Prednisone.  Discussed importance of checking CBGs and maintaining good CBG control to  prevent long-term and short-term complications. Explained how hyperglycemia leads to damage within blood vessels which lead to the common complications seen with uncontrolled diabetes. Stressed to the patient the importance of improving glycemic control to prevent further complications from uncontrolled diabetes. Discussed impact of nutrition, exercise, stress, sickness, and medications on diabetes control. Patient states that he does not eat sweets but notes he eats bread and rice which increase his glucose.  Encouraged patient to talk with his PCP about improving DM control as he likely needs additional DM medications to get DM controlled.Discussed hyperglycemia noted while inpatient with steroids and explained that insulin will be used while inpatient for DM control.  Patient verbalized understanding of information discussed and he states that he has no further questions at this time related to diabetes.  Thanks, Orlando Penner, RN, MSN, CDE Diabetes Coordinator Inpatient Diabetes Program [phone] (Team Pager)

## 2018-12-30 NOTE — Progress Notes (Signed)
MD notified: BP 140/103, would you like to order PRN BP med.

## 2018-12-30 NOTE — Transfer of Care (Signed)
Immediate Anesthesia Transfer of Care Note  Patient: Joshua Holder  Procedure(s) Performed: Procedure(s): INCISION AND DRAINAGE LEFT ELBOW (Left)  Patient Location: PACU  Anesthesia Type:General  Level of Consciousness: sedated  Airway & Oxygen Therapy: Patient Spontanous Breathing and Patient connected to face mask oxygen  Post-op Assessment: Report given to RN and Post -op Vital signs reviewed and stable  Post vital signs: Reviewed and stable  Last Vitals:  Vitals:   12/30/18 0945 12/30/18 1507  BP: (!) 136/99 110/90  Pulse: 68 89  Resp: 16 14  Temp: (!) 36.2 C (!) 35.8 C  SpO2: 99% 97%    Complications: No apparent anesthesia complications

## 2018-12-30 NOTE — Progress Notes (Signed)
[facility] - [place] [facility]   PATIENT NAME: Joshua Holder    MR#:  [id number]  DATE OF BIRTH:  20-Nov-1965  SUBJECTIVE: Status post left elbow debridement, intraoperative findings concerning for still septic versus inflammatory arthritis like gout.  Patient feels hisleft hand fingers are numb and also has significant pain in the left elbow.  CHIEF COMPLAINT:   Chief Complaint  Patient presents with  . Arm Pain    REVIEW OF SYSTEMS:   ROS CONSTITUTIONAL: No fever, fatigue or weakness.  EYES: No blurred or double vision.  EARS, NOSE, AND THROAT: No tinnitus or ear pain.  RESPIRATORY: No cough, shortness of breath, wheezing or hemoptysis.  CARDIOVASCULAR: No chest pain, orthopnea, edema.  GASTROINTESTINAL: No nausea, vomiting, diarrhea or abdominal pain.  GENITOURINARY: No dysuria, hematuria.  ENDOCRINE: No polyuria, nocturia,  HEMATOLOGY: No anemia, easy bruising or bleeding SKIN: No rash or lesion. MUSCULOSKELETAL feels very uncomfortable because of left elbow pain. NEUROLOGIC: No tingling, numbness, weakness.  PSYCHIATRY: No anxiety or depression.   DRUG ALLERGIES:   Allergies  Allergen Reactions  . Lisinopril Swelling and Cough    VITALS:  Blood pressure (!) 134/99, pulse 77, temperature 97.6 F (36.4 C), temperature source Oral, resp. rate 16, height 5\' 11"  (1.803 m), weight 121.1 kg, SpO2 96 %.  PHYSICAL EXAMINATION:  GENERAL:  53 y.o.-year-old patient lying in the bed with no acute distress.  EYES: Pupils equal, round, reactive to light and accommodation. No scleral icterus. Extraocular muscles intact.  HEENT: Head atraumatic, normocephalic. Oropharynx and nasopharynx clear.  NECK:  Supple, no jugular venous distention. No thyroid enlargement, no tenderness.  LUNGS: Normal breath sounds bilaterally, no wheezing, rales,rhonchi or crepitation. No use of accessory muscles of respiration.  CARDIOVASCULAR: S1, S2 normal. No murmurs,  rubs, or gallops.  ABDOMEN: Soft, nontender, nondistended. Bowel sounds present. No organomegaly or mass.  EXTREMITIES: No pedal edema, cyanosis, or clubbing.  NEUROLOGIC: Cranial nerves II through XII are intact. Muscle strength 5/5 in all extremities. Sensation intact. Gait not checked.  PSYCHIATRIC: The patient is alert and oriented x 3.  SKIN: No obvious rash, lesion, or ulcer.    LABORATORY PANEL:   CBC Recent Labs  Lab 12/30/18 0253  WBC 7.7  HGB 12.3*  HCT 36.8*  PLT 265   ------------------------------------------------------------------------------------------------------------------  Chemistries  Recent Labs  Lab 12/30/18 0253  NA 131*  K 4.8  CL 96*  CO2 23  GLUCOSE 455*  BUN 16  CREATININE 1.04  CALCIUM 8.7*   ------------------------------------------------------------------------------------------------------------------  Cardiac Enzymes No results for input(s): TROPONINI in the last 168 hours. ------------------------------------------------------------------------------------------------------------------  RADIOLOGY:  Dg Elbow Complete Left  Result Date: 12/29/2018 CLINICAL DATA:  Right arm pain for 5 days. Patient diagnosed with gout 5 days ago. EXAM: LEFT ELBOW - COMPLETE 3+ VIEW COMPARISON:  None. FINDINGS: Soft tissue edema identified in the elbow. A joint effusion is noted with displacement of the anterior posterior fat pads. No fractures are seen. IMPRESSION: Joint effusion in the elbow, likely secondary to the patient's reported gout. Soft tissue edema. No fractures or bony erosion noted. Electronically Signed   By: Gerome Sam III M.D   On: 12/29/2018 17:10   Mr Elbow Left W Wo Contrast  Result Date: 12/29/2018 CLINICAL DATA:  Worsening elbow pain, redness, and swelling. History of gout. EXAM: MRI OF THE LEFT ELBOW WITHOUT AND WITH CONTRAST TECHNIQUE: Multiplanar, multisequence MR imaging of the elbow was performed before and after the  administration of intravenous contrast.  CONTRAST:  10 mL Gadavist intravenous contrast. COMPARISON:  Left elbow x-rays from same day. FINDINGS: TENDONS Common forearm flexor origin: Intact with normal signal. Common forearm extensor origin: Small partial tear. Biceps: Intact. Triceps: Intact with normal signal. LIGAMENTS Medial stabilizers: Grossly intact, although evaluation is limited due to patient's inability to extend the elbow. Lateral stabilizers: Grossly intact, although evaluation is limited due to patient's inability to extend the elbow. Cartilage: Preserved.  No focal chondral defect demonstrated. Joint: Large joint effusion with thick, enhancing synovitis. No intra-articular body. Cubital tunnel: Unremarkable.  The ulnar nerve appears normal. Bones: No acute or significant extra-articular osseous findings. Other: Diffuse soft tissue edema and enhancement about the elbow. Moderate amount of fluid in the olecranon bursa. Ill-defined 2.2 x 1.3 x 1.1 cm rim enhancing fluid collection in the proximal posterior forearm. Additional ill-defined 2.0 x 2.5 x 3.1 cm rim enhancing fluid collection in the posterior elbow soft tissues just inferior to the medial epicondyle. IMPRESSION: 1. Extensive cellulitis about the left elbow with small soft tissue abscesses in the proximal posterior forearm and posteromedial elbow. 2. Large elbow joint effusion with thick, enhancing synovitis. Moderate fluid in the olecranon bursa. While these findings could be related to gout, they are more concerning for septic arthritis and olecranon bursitis until proven otherwise given the extensive soft tissue infection surrounding the elbow. 3. Small partial tear of the common forearm extensor tendon origin. Electronically Signed   By: Obie Dredge M.D.   On: 12/29/2018 19:52    EKG:  No orders found for this or any previous visit.  ASSESSMENT AND PLAN:   53 year old male patient with a one-week history of left left elbow pain,  swelling, erythema #1 acute septic joint versus left elbow gouty arthritis, status post incision and drainage by orthopedic this afternoon, continue IV antibiotics, patient still has paresthesias of the left hand may be related to the tourniquet that was applied as per orthopedic note, continue vancomycin, follow intraoperative cultures Left elbow arthritis. Diabetes mellitus type 2; uncontrolled secondary to sepsis, continue Lantus 20 units, insulin sliding scale coverage. 3.  Chronic gout affects his wrist, great toe usually, continue allopurinol 100 mg daily  #4 essential hypertension, uncontrolled secondary to left elbow pain also.  Continue pain control with morphine, oxycodone, use PRN hydralazine for now for BP. Wean off oxygen to room air.  All the records are reviewed and case discussed with Care Management/Social Workerr. Management plans discussed with the patient, family and they are in agreement.  CODE STATUS: Full code  TOTAL TIME TAKING CARE OF THIS PATIENT: 35 minutes.   POSSIBLE D/C IN 2-3DAYS, DEPENDING ON CLINICAL CONDITION.   Katha Hamming M.D on 12/30/2018 at 5:26 PM  Between 7am to 6pm - Pager - [phone]  After 6pm go to [email or web address] - password [email or web address]  [facility]  Office  [phone]  CC: Primary care physician; System, Provider Not In   Note: This dictation was prepared with Dragon dictation along with smaller phrase technology. Any transcriptional errors that result from this process are unintentional.

## 2018-12-30 NOTE — Anesthesia Preprocedure Evaluation (Signed)
Anesthesia Evaluation  Patient identified by MRN, date of birth, ID band Patient awake    Reviewed: Allergy & Precautions, NPO status , Patient's Chart, lab work & pertinent test results, reviewed documented beta blocker date and time   Airway Mallampati: III  TM Distance: >3 FB     Dental  (+) Chipped   Pulmonary           Cardiovascular hypertension, Pt. on medications      Neuro/Psych    GI/Hepatic   Endo/Other  diabetes, Type 2  Renal/GU      Musculoskeletal   Abdominal   Peds  Hematology   Anesthesia Other Findings Obese. Gout.  Reproductive/Obstetrics                             Anesthesia Physical Anesthesia Plan  ASA: III  Anesthesia Plan: General   Post-op Pain Management:    Induction: Intravenous  PONV Risk Score and Plan:   Airway Management Planned: Oral ETT and LMA  Additional Equipment:   Intra-op Plan:   Post-operative Plan:   Informed Consent: I have reviewed the patients History and Physical, chart, labs and discussed the procedure including the risks, benefits and alternatives for the proposed anesthesia with the patient or authorized representative who has indicated his/her understanding and acceptance.       Plan Discussed with: CRNA  Anesthesia Plan Comments:         Anesthesia Quick Evaluation

## 2018-12-30 NOTE — Progress Notes (Signed)
Subjective:  Patient reports 2-day that his left elbow pain as mild to moderate.  Patient denies any fevers or chills.  He feels that the swelling and redness in the forearm have improved overnight on IV antibiotics.  Objective:   VITALS:   Vitals:   12/29/18 1954 12/30/18 0541 12/30/18 0945 12/30/18 0950  BP: 120/82 129/81 (!) 136/99   Pulse: 75 89 68   Resp: 18 18 16    Temp: 98.3 F (36.8 C) 98.2 F (36.8 C) (!) 97.2 F (36.2 C)   TempSrc: Oral Oral Tympanic   SpO2: 99% 99% 99%   Weight:    121.1 kg  Height:    5\' 11"  (1.803 m)    PHYSICAL EXAM: Left upper extremity: Patient still demonstrates swelling in the left elbow.  Forearm swelling and erythema have improved.  Patient has full digital range of motion, intact sensation to touch and a palpable radial pulse.  His range of motion is from -40 to approximate 120 degrees of flexion.   LABS  Results for orders placed or performed during the hospital encounter of 12/29/18 (from the past 24 hour(s))  CBC     Status: Abnormal   Collection Time: 12/29/18  4:48 PM  Result Value Ref Range   WBC 8.6 4.0 - 10.5 K/uL   RBC 4.38 4.22 - 5.81 MIL/uL   Hemoglobin 12.7 (L) 13.0 - 17.0 g/dL   HCT 96.2 (L) 22.9 - 79.8 %   MCV 85.6 80.0 - 100.0 fL   MCH 29.0 26.0 - 34.0 pg   MCHC 33.9 30.0 - 36.0 g/dL   RDW 92.1 19.4 - 17.4 %   Platelets 266 150 - 400 K/uL   nRBC 0.0 0.0 - 0.2 %  Basic metabolic panel     Status: Abnormal   Collection Time: 12/29/18  4:48 PM  Result Value Ref Range   Sodium 133 (L) 135 - 145 mmol/L   Potassium 4.1 3.5 - 5.1 mmol/L   Chloride 96 (L) 98 - 111 mmol/L   CO2 24 22 - 32 mmol/L   Glucose, Bld 202 (H) 70 - 99 mg/dL   BUN 15 6 - 20 mg/dL   Creatinine, Ser 0.81 0.61 - 1.24 mg/dL   Calcium 8.7 (L) 8.9 - 10.3 mg/dL   GFR calc non Af Amer >60 >60 mL/min   GFR calc Af Amer >60 >60 mL/min   Anion gap 13 5 - 15  Uric acid     Status: None   Collection Time: 12/29/18  4:48 PM  Result Value Ref Range    Uric Acid, Serum 6.3 3.7 - 8.6 mg/dL  Synovial cell count + diff, w/ crystals     Status: Abnormal   Collection Time: 12/29/18  4:48 PM  Result Value Ref Range   Color, Synovial YELLOW (A) YELLOW   Appearance-Synovial TURBID (A) CLEAR   Crystals, Fluid EXTRACELLULAR MONOSODIUM URATE CRYSTALS    WBC, Synovial 28,819 (H) 0 - 200 /cu mm   Neutrophil, Synovial 91 %   Lymphocytes-Synovial Fld 6 %   Monocyte-Macrophage-Synovial Fluid 3 %   Eosinophils-Synovial 0 %  Lactic acid, plasma     Status: None   Collection Time: 12/29/18  4:48 PM  Result Value Ref Range   Lactic Acid, Venous 0.9 0.5 - 1.9 mmol/L  Sedimentation rate     Status: Abnormal   Collection Time: 12/29/18  4:48 PM  Result Value Ref Range   Sed Rate 88 (H) 0 - 20 mm/hr  Blood  culture (single)     Status: None (Preliminary result)   Collection Time: 12/29/18  4:50 PM  Result Value Ref Range   Specimen Description BLOOD RIGHT ANTECUBITAL    Special Requests      BOTTLES DRAWN AEROBIC AND ANAEROBIC Blood Culture adequate volume   Culture      NO GROWTH < 12 HOURS Performed at [facility], [address], [place], [place] [address]    Report Status PENDING   Protime-INR     Status: None   Collection Time: 12/29/18  5:20 PM  Result Value Ref Range   Prothrombin Time 13.3 11.4 - 15.2 seconds   INR 1.0 0.8 - 1.2  Body fluid culture     Status: None (Preliminary result)   Collection Time: 12/29/18  5:42 PM  Result Value Ref Range   Specimen Description SYNOVIAL    Special Requests LEFT ELBOW    Gram Stain      ABUNDANT WBC PRESENT,BOTH PMN AND MONONUCLEAR NO ORGANISMS SEEN Performed at [facility], [address] [place], [place] [address]    Culture PENDING    Report Status PENDING   Lactic acid, plasma     Status: None   Collection Time: 12/29/18  9:39 PM  Result Value Ref Range   Lactic Acid, Venous 1.0 0.5 - 1.9 mmol/L  C-reactive protein     Status: Abnormal   Collection Time: 12/29/18   9:39 PM  Result Value Ref Range   CRP 20.1 (H) <1.0 mg/dL  CBC     Status: Abnormal   Collection Time: 12/30/18  2:53 AM  Result Value Ref Range   WBC 7.7 4.0 - 10.5 K/uL   RBC 4.22 4.22 - 5.81 MIL/uL   Hemoglobin 12.3 (L) 13.0 - 17.0 g/dL   HCT 75.1 (L) 70.0 - 17.4 %   MCV 87.2 80.0 - 100.0 fL   MCH 29.1 26.0 - 34.0 pg   MCHC 33.4 30.0 - 36.0 g/dL   RDW 94.4 96.7 - 59.1 %   Platelets 265 150 - 400 K/uL   nRBC 0.0 0.0 - 0.2 %  Basic metabolic panel     Status: Abnormal   Collection Time: 12/30/18  2:53 AM  Result Value Ref Range   Sodium 131 (L) 135 - 145 mmol/L   Potassium 4.8 3.5 - 5.1 mmol/L   Chloride 96 (L) 98 - 111 mmol/L   CO2 23 22 - 32 mmol/L   Glucose, Bld 455 (H) 70 - 99 mg/dL   BUN 16 6 - 20 mg/dL   Creatinine, Ser 6.38 0.61 - 1.24 mg/dL   Calcium 8.7 (L) 8.9 - 10.3 mg/dL   GFR calc non Af Amer >60 >60 mL/min   GFR calc Af Amer >60 >60 mL/min   Anion gap 12 5 - 15  Glucose, capillary     Status: Abnormal   Collection Time: 12/30/18  6:18 AM  Result Value Ref Range   Glucose-Capillary 281 (H) 70 - 99 mg/dL  Glucose, capillary     Status: Abnormal   Collection Time: 12/30/18  8:12 AM  Result Value Ref Range   Glucose-Capillary 312 (H) 70 - 99 mg/dL  Glucose, capillary     Status: Abnormal   Collection Time: 12/30/18  9:51 AM  Result Value Ref Range   Glucose-Capillary 240 (H) 70 - 99 mg/dL  Glucose, capillary     Status: Abnormal   Collection Time: 12/30/18 12:36 PM  Result Value Ref Range  Glucose-Capillary 209 (H) 70 - 99 mg/dL    Dg Elbow Complete Left  Result Date: 12/29/2018 CLINICAL DATA:  Right arm pain for 5 days. Patient diagnosed with gout 5 days ago. EXAM: LEFT ELBOW - COMPLETE 3+ VIEW COMPARISON:  None. FINDINGS: Soft tissue edema identified in the elbow. A joint effusion is noted with displacement of the anterior posterior fat pads. No fractures are seen. IMPRESSION: Joint effusion in the elbow, likely secondary to the patient's reported gout.  Soft tissue edema. No fractures or bony erosion noted. Electronically Signed   By: Gerome Sam III M.D   On: 12/29/2018 17:10   Mr Elbow Left W Wo Contrast  Result Date: 12/29/2018 CLINICAL DATA:  Worsening elbow pain, redness, and swelling. History of gout. EXAM: MRI OF THE LEFT ELBOW WITHOUT AND WITH CONTRAST TECHNIQUE: Multiplanar, multisequence MR imaging of the elbow was performed before and after the administration of intravenous contrast. CONTRAST:  10 mL Gadavist intravenous contrast. COMPARISON:  Left elbow x-rays from same day. FINDINGS: TENDONS Common forearm flexor origin: Intact with normal signal. Common forearm extensor origin: Small partial tear. Biceps: Intact. Triceps: Intact with normal signal. LIGAMENTS Medial stabilizers: Grossly intact, although evaluation is limited due to patient's inability to extend the elbow. Lateral stabilizers: Grossly intact, although evaluation is limited due to patient's inability to extend the elbow. Cartilage: Preserved.  No focal chondral defect demonstrated. Joint: Large joint effusion with thick, enhancing synovitis. No intra-articular body. Cubital tunnel: Unremarkable.  The ulnar nerve appears normal. Bones: No acute or significant extra-articular osseous findings. Other: Diffuse soft tissue edema and enhancement about the elbow. Moderate amount of fluid in the olecranon bursa. Ill-defined 2.2 x 1.3 x 1.1 cm rim enhancing fluid collection in the proximal posterior forearm. Additional ill-defined 2.0 x 2.5 x 3.1 cm rim enhancing fluid collection in the posterior elbow soft tissues just inferior to the medial epicondyle. IMPRESSION: 1. Extensive cellulitis about the left elbow with small soft tissue abscesses in the proximal posterior forearm and posteromedial elbow. 2. Large elbow joint effusion with thick, enhancing synovitis. Moderate fluid in the olecranon bursa. While these findings could be related to gout, they are more concerning for septic  arthritis and olecranon bursitis until proven otherwise given the extensive soft tissue infection surrounding the elbow. 3. Small partial tear of the common forearm extensor tendon origin. Electronically Signed   By: Obie Dredge M.D.   On: 12/29/2018 19:52    Assessment/Plan: Day of Surgery   Active Problems:   Cellulitis  Reviewed the patient's MRI of the left elbow showing abscesses in the olecranon bursa and in the medial elbow.  There is question of possible septic joint.  I am recommending I&D of the left elbow.  I explained the details of the operation as well as the postoperative course.  I also explained the risks and benefits of the procedure to the patient.  Patient was in agreement with the surgical plan.    Juanell Fairly , MD 12/30/2018, 1:02 PM

## 2018-12-30 NOTE — Op Note (Signed)
12/30/2018  3:11 PM  PATIENT:  Joshua Holder    PRE-OPERATIVE DIAGNOSIS:  POSTERIOR LEFT ELBOW ABSCESSES  POST-OPERATIVE DIAGNOSIS:  Left elbow extensive crystalline deposits throughout the bursa.  Focal collections of caseous white material x 3.  Possible septic joint vs. Inflammatory arthritis, likely gout.  PROCEDURE:  EXTENSIVE EXCISION OF LEFT ELBOW BURSA, DRAINAGE OF MULTIPLE COLLECTIONS OF CASEOUS MATERIAL, LIKELY GOUT CRYSTALS,  DRAINAGE AND IRRIGATION OF LEFT ELBOW JOINT  SURGEON:  Juanell Fairly, MD  ANESTHESIA:   General  PATHOLOGY: Samples of olecranon bursa sent to the lab for evaluation for gout.  3 swabs were sent for culture, 1 from the posterior olecranon bursa, 1 from a medial caseous collection and one from the left elbow joint.  All were sent for culture.  PREOPERATIVE INDICATIONS:  Joshua Holder is a  53 y.o. male with 1 week of left elbow pain, swelling and erythema.  Patient has a history of numerous previous general flareups involving numerous joints.  He was seen in the office last week.  Patient was presumed to have a gout attack in the left elbow.  Patient had erythema over the tip of the olecranon as well as focal swelling of the left posterior elbow without fluctuance.  Patient was started on Septra and given a Medrol dose pack as the patient states this is the only medication that helps acute flares of his gout.  Patient did not respond to outpatient treatment and return to the office yesterday.  Patient had increasing redness and swelling which extended into the proximal forearm.  Patient was admitted for IV antibiotics.  He was placed on vancomycin IV overnight.  An MRI was performed of the left elbow which demonstrated focal fluid collections in the olecranon bursa and medial aspect of the left elbow.  These were presumed to be abscesses.  Therefore the decision was made to perform an incision and drainage of the left olecranon bursa and medial abscesses.  The  MRI also brought into question a septic left elbow joint and the decision was made to irrigate the joint as well.  I discussed the risks and benefits of surgery. The risks include but are not limited to persistent infection, bleeding, nerve or blood vessel injury, joint stiffness or loss of motion, persistent pain, weakness or instability, and the need for further surgery including repeat I&D. Medical risks include but are not limited to DVT and pulmonary embolism, myocardial infarction, stroke, pneumonia, respiratory failure and death. Patient understood these risks and wished to proceed.   OPERATIVE FINDINGS: Patient had extensive sloughing deposits throughout the olecranon bursa with severe thickening of the bursa.  Patient had 3 medial and one posterior collections of caseous white material presumed to be gout.  There was cloudy fluid drained from the elbow joint with crystals in the fluid.  OPERATIVE PROCEDURE: Patient had the left elbow signed with initials and the word yes according the hospitals correct site of surgery protocol.  He was brought to the operating room and underwent general anesthesia.  He was placed in a soft lateral position using a beanbag.  All bony prominences were adequately padded.  A timeout was performed to verify the patient's name, date of birth, medical record number, correct site of surgery and correct procedure to be performed.  Timeout was also used to verify patient received antibiotics and all appropriate instruments, implants and radiographic studies were available in the room.  Patient received 2 g of Ancef.  Patient had already received IV vancomycin over  night.  Once all limit attendance were in agreement the case began.  A tourniquet was applied to the left upper extremity.  The tourniquet was inflated without.  An Esmarch was not used to prevent spread of infection.  Tourniquet was inflated to 250 mmHg.  A curvilinear incision was made over the left elbow.  The  incision was curved to the lateral side of the tip of the olecranon.  All bleeding was cauterized with electrocautery.  The bursa was found to be significantly thickened with extensive of crystalline deposition.  Extensive debridement of the bursa was performed.  The ulnar nerve was identified and care was taken to avoid injury to the ulnar nerve during the case.  A posterior collection of fluid was encountered and a culture swab sent of this fluid for culture.  3 medial focal collections of white caseous material was encountered.  1 of these fluid pockets was also swabbed and sent for culture.  Following debridement the elbow was copiously irrigated.  The elbow joint was entered posteriorly through a longitudinal 1 cm incision in the center of the triceps tendon.  Cloudy fluid was evacuated and cultured with a swab.  This was copiously irrigated.  Anterior portion of the elbow joint was then entered via a limited Kocher approach.  Again, cloudy fluid with crystals was observed.  The joint was copiously irrigated with gentamicin infused saline.  After copious irrigation of the left elbow joint and bursa, the stab incision of the triceps was closed with a 0 Vicryl.  The limited Kocher approach was also closed with a 0 Vicryl.  The subcutaneous tissue was closed with 2-0 Vicryl.  The skin was approximated with staples.  A dry sterile dressing was applied.  Patient was placed in a left elbow sling.  Patient was awoken and brought to the PACU in stable condition.  I scrubbed and present for the entire case.  All sharp instruments and sponge counts were correct at conclusion the case.

## 2018-12-30 NOTE — Care Management (Signed)
No CM consult- low risk for readmission. No potential discharge needs identified by members of the care team.  There is no anticipated need for home IV antibiotic therapy at present. I/D of left elbow today

## 2018-12-30 NOTE — Anesthesia Procedure Notes (Signed)
Procedure Name: Intubation Performed by: Ednah Hammock, CRNA Pre-anesthesia Checklist: Patient identified, Patient being monitored, Timeout performed, Emergency Drugs available and Suction available Patient Re-evaluated:Patient Re-evaluated prior to induction Oxygen Delivery Method: Circle system utilized Preoxygenation: Pre-oxygenation with 100% oxygen Induction Type: IV induction Ventilation: Mask ventilation without difficulty Laryngoscope Size: 4 and McGraph Grade View: Grade I Tube type: Oral Tube size: 7.5 mm Number of attempts: 1 Airway Equipment and Method: Stylet and Video-laryngoscopy Placement Confirmation: ETT inserted through vocal cords under direct vision,  positive ETCO2 and breath sounds checked- equal and bilateral Secured at: 22 cm Tube secured with: Tape Dental Injury: Teeth and Oropharynx as per pre-operative assessment        

## 2018-12-30 NOTE — Anesthesia Post-op Follow-up Note (Signed)
Anesthesia QCDR form completed.        

## 2018-12-31 ENCOUNTER — Encounter: Payer: Self-pay | Admitting: Orthopedic Surgery

## 2018-12-31 ENCOUNTER — Other Ambulatory Visit: Payer: Self-pay | Admitting: Infectious Diseases

## 2018-12-31 DIAGNOSIS — E119 Type 2 diabetes mellitus without complications: Secondary | ICD-10-CM

## 2018-12-31 DIAGNOSIS — L03114 Cellulitis of left upper limb: Secondary | ICD-10-CM

## 2018-12-31 DIAGNOSIS — L02414 Cutaneous abscess of left upper limb: Secondary | ICD-10-CM

## 2018-12-31 DIAGNOSIS — Z7984 Long term (current) use of oral hypoglycemic drugs: Secondary | ICD-10-CM

## 2018-12-31 DIAGNOSIS — M25422 Effusion, left elbow: Secondary | ICD-10-CM

## 2018-12-31 DIAGNOSIS — M7022 Olecranon bursitis, left elbow: Secondary | ICD-10-CM

## 2018-12-31 DIAGNOSIS — M10022 Idiopathic gout, left elbow: Principal | ICD-10-CM

## 2018-12-31 DIAGNOSIS — I10 Essential (primary) hypertension: Secondary | ICD-10-CM

## 2018-12-31 DIAGNOSIS — Z888 Allergy status to other drugs, medicaments and biological substances status: Secondary | ICD-10-CM

## 2018-12-31 LAB — GLUCOSE, CAPILLARY
Glucose-Capillary: 268 mg/dL — ABNORMAL HIGH (ref 70–99)
Glucose-Capillary: 309 mg/dL — ABNORMAL HIGH (ref 70–99)
Glucose-Capillary: 188 mg/dL — ABNORMAL HIGH (ref 70–99)
Glucose-Capillary: 237 mg/dL — ABNORMAL HIGH (ref 70–99)

## 2018-12-31 LAB — SURGICAL PATHOLOGY

## 2018-12-31 LAB — HEMOGLOBIN A1C
Hgb A1c MFr Bld: 7.7 % — ABNORMAL HIGH (ref 4.8–5.6)
Mean Plasma Glucose: 174 mg/dL

## 2018-12-31 LAB — HIV ANTIBODY (ROUTINE TESTING W REFLEX): HIV Screen 4th Generation wRfx: NONREACTIVE

## 2018-12-31 MED ORDER — LINEZOLID 600 MG PO TABS: 600.0000 mg | ORAL_TABLET | Freq: Two times a day (BID) | ORAL | 0 refills | Status: DC

## 2018-12-31 MED ORDER — INSULIN GLARGINE 100 UNIT/ML ~~LOC~~ SOLN
10.0000 [IU] | Freq: Every day | SUBCUTANEOUS | Status: DC
  Administered 2018-12-31 – 2019-01-01 (×2): 10 [IU] via SUBCUTANEOUS
  Filled 2018-12-31 (×2): qty 0.1

## 2018-12-31 NOTE — Progress Notes (Signed)
[facility] - [place] at [facility]   PATIENT NAME: Joshua Holder    MR#:  [id number]  DATE OF BIRTH:  07/30/1966    CHIEF COMPLAINT:   Chief Complaint  Patient presents with  . Arm Pain  Patient seen today With decreased pain in the left elbow No fever Status post left elbow swelling drained yesterday intraoperatively debridement  REVIEW OF SYSTEMS:   ROS CONSTITUTIONAL: No fever, fatigue or weakness.  EYES: No blurred or double vision.  EARS, NOSE, AND THROAT: No tinnitus or ear pain.  RESPIRATORY: No cough, shortness of breath, wheezing or hemoptysis.  CARDIOVASCULAR: No chest pain, orthopnea, edema.  GASTROINTESTINAL: No nausea, vomiting, diarrhea or abdominal pain.  GENITOURINARY: No dysuria, hematuria.  ENDOCRINE: No polyuria, nocturia,  HEMATOLOGY: No anemia, easy bruising or bleeding SKIN: No rash or lesion. MUSCULOSKELETAL feels very uncomfortable because of left elbow pain. NEUROLOGIC: No tingling, numbness, weakness.  PSYCHIATRY: No anxiety or depression.   DRUG ALLERGIES:   Allergies  Allergen Reactions  . Lisinopril Swelling and Cough    VITALS:  Blood pressure (!) 118/92, pulse 68, temperature 98.2 F (36.8 C), temperature source Oral, resp. rate 18, height 5\' 11"  (1.803 m), weight 121.1 kg, SpO2 96 %.  PHYSICAL EXAMINATION:  GENERAL:  53 y.o.-year-old patient lying in the bed with no acute distress.  EYES: Pupils equal, round, reactive to light and accommodation. No scleral icterus. Extraocular muscles intact.  HEENT: Head atraumatic, normocephalic. Oropharynx and nasopharynx clear.  NECK:  Supple, no jugular venous distention. No thyroid enlargement, no tenderness.  LUNGS: Normal breath sounds bilaterally, no wheezing, rales,rhonchi or crepitation. No use of accessory muscles of respiration.  CARDIOVASCULAR: S1, S2 normal. No murmurs, rubs, or gallops.  ABDOMEN: Soft, nontender, nondistended. Bowel sounds present. No  organomegaly or mass.  EXTREMITIES: No pedal edema, cyanosis, or clubbing.  NEUROLOGIC: Cranial nerves II through XII are intact. Muscle strength 5/5 in all extremities. Sensation intact. Gait not checked.  PSYCHIATRIC: The patient is alert and oriented x 3.  SKIN: No obvious rash, lesion, or ulcer.    LABORATORY PANEL:   CBC Recent Labs  Lab 12/30/18 0253  WBC 7.7  HGB 12.3*  HCT 36.8*  PLT 265   ------------------------------------------------------------------------------------------------------------------  Chemistries  Recent Labs  Lab 12/30/18 0253  NA 131*  K 4.8  CL 96*  CO2 23  GLUCOSE 455*  BUN 16  CREATININE 1.04  CALCIUM 8.7*   ------------------------------------------------------------------------------------------------------------------  Cardiac Enzymes No results for input(s): TROPONINI in the last 168 hours. ------------------------------------------------------------------------------------------------------------------  RADIOLOGY:  Dg Elbow Complete Left  Result Date: 12/29/2018 CLINICAL DATA:  Right arm pain for 5 days. Patient diagnosed with gout 5 days ago. EXAM: LEFT ELBOW - COMPLETE 3+ VIEW COMPARISON:  None. FINDINGS: Soft tissue edema identified in the elbow. A joint effusion is noted with displacement of the anterior posterior fat pads. No fractures are seen. IMPRESSION: Joint effusion in the elbow, likely secondary to the patient's reported gout. Soft tissue edema. No fractures or bony erosion noted. Electronically Signed   By: Gerome Sam III M.D   On: 12/29/2018 17:10   Mr Elbow Left W Wo Contrast  Result Date: 12/29/2018 CLINICAL DATA:  Worsening elbow pain, redness, and swelling. History of gout. EXAM: MRI OF THE LEFT ELBOW WITHOUT AND WITH CONTRAST TECHNIQUE: Multiplanar, multisequence MR imaging of the elbow was performed before and after the administration of intravenous contrast. CONTRAST:  10 mL Gadavist intravenous contrast.  COMPARISON:  Left elbow x-rays from  same day. FINDINGS: TENDONS Common forearm flexor origin: Intact with normal signal. Common forearm extensor origin: Small partial tear. Biceps: Intact. Triceps: Intact with normal signal. LIGAMENTS Medial stabilizers: Grossly intact, although evaluation is limited due to patient's inability to extend the elbow. Lateral stabilizers: Grossly intact, although evaluation is limited due to patient's inability to extend the elbow. Cartilage: Preserved.  No focal chondral defect demonstrated. Joint: Large joint effusion with thick, enhancing synovitis. No intra-articular body. Cubital tunnel: Unremarkable.  The ulnar nerve appears normal. Bones: No acute or significant extra-articular osseous findings. Other: Diffuse soft tissue edema and enhancement about the elbow. Moderate amount of fluid in the olecranon bursa. Ill-defined 2.2 x 1.3 x 1.1 cm rim enhancing fluid collection in the proximal posterior forearm. Additional ill-defined 2.0 x 2.5 x 3.1 cm rim enhancing fluid collection in the posterior elbow soft tissues just inferior to the medial epicondyle. IMPRESSION: 1. Extensive cellulitis about the left elbow with small soft tissue abscesses in the proximal posterior forearm and posteromedial elbow. 2. Large elbow joint effusion with thick, enhancing synovitis. Moderate fluid in the olecranon bursa. While these findings could be related to gout, they are more concerning for septic arthritis and olecranon bursitis until proven otherwise given the extensive soft tissue infection surrounding the elbow. 3. Small partial tear of the common forearm extensor tendon origin. Electronically Signed   By: Obie Dredge M.D.   On: 12/29/2018 19:52    EKG:  No orders found for this or any previous visit.  ASSESSMENT AND PLAN:   53 year old male patient with a one-week history of left left elbow pain, swelling, erythema  #1. acute septic joint versus left elbow gouty arthritis, status  post incision and drainage by orthopedic yesterday, continue IV antibiotics, follow-up cultures.  Orthopedic follow-up.  Infectious disease consult for guidance on antibiotics.  #2 .Diabetes mellitus type 2; uncontrolled secondary to sepsis, continue Lantus 20 units, insulin sliding scale coverage.   #3.  Chronic gout affects his wrist, great toe usually, continue allopurinol 100 mg daily  #4. essential hypertension, uncontrolled secondary to left elbow pain also.  Continue pain control with morphine, oxycodone, use PRN   All the records are reviewed and case discussed with Care Management/Social Workerr. Management plans discussed with the patient, family and they are in agreement.  CODE STATUS: Full code  TOTAL TIME TAKING CARE OF THIS PATIENT: 35 minutes.   POSSIBLE D/C IN 2-3DAYS, DEPENDING ON CLINICAL CONDITION.   [name] M.D on 12/31/2018 at 11:37 AM  Between 7am to 6pm - Pager - [phone]  After 6pm go to [email or web address] - password [email or web address]  [facility]  Office  [phone]  CC: Primary care physician; System, Provider Not In   Note: This dictation was prepared with Dragon dictation along with smaller phrase technology. Any transcriptional errors that result from this process are unintentional.

## 2018-12-31 NOTE — Progress Notes (Signed)
Subjective:  POD #1 s/p I&D of left elbow joint and olecranon bursa.   Patient reports left elbow pain as mild.  Patient states that his left elbow pain has improved.  He has been on Vancomycin IV.  Objective:   VITALS:   Vitals:   12/30/18 1811 12/30/18 2026 12/31/18 0512 12/31/18 1257  BP: 120/78 (!) 132/92 (!) 118/92 117/89  Pulse: (!) 101 82 68 80  Resp: 20 20 18 18   Temp: 98.4 F (36.9 C) 97.6 F (36.4 C) 98.2 F (36.8 C) (!) 97.5 F (36.4 C)  TempSrc: Oral Oral Oral Oral  SpO2: 93% 97% 96% 97%  Weight:      Height:        PHYSICAL EXAM: Left upper extremity:  I personally changed the patient's dressing today.  Erythema and swelling over left elbow and proximal forearm has dramatically improved.   Dorsal hand swelling has resolved.  Incision is C/D/I.  He can actively flex and extend left elbow with minimal pain.   ROM is still limited due to swelling.  ROM -30 to 120.  Patient has intact sensation to light touch in all digits of the left hand.   Patient can flex and extend all digits.   LABS  Results for orders placed or performed during the hospital encounter of 12/29/18 (from the past 24 hour(s))  Aerobic/Anaerobic Culture (surgical/deep wound)     Status: None (Preliminary result)   Collection Time: 12/30/18  2:01 PM  Result Value Ref Range   Specimen Description      ABSCESS Performed at [facility], [address] [place], [place] [address]    Special Requests      LEFT Virgil BenedictLECRANON BURSA Performed at [facility], [address] [place], [place] [address]    Gram Stain NO WBC SEEN NO ORGANISMS SEEN     Culture      NO GROWTH < 24 HOURS Performed at [facility], [address] [place], [place] [address]    Report Status PENDING   Aerobic/Anaerobic Culture (surgical/deep wound)     Status: None (Preliminary result)   Collection Time: 12/30/18  2:01 PM  Result Value Ref Range   Specimen Description      ABSCESS Performed at  [facility], [address] [place], [place] [address]    Special Requests      LEFT ELBOW Performed at [facility], [address] [place], [place] [address]    Gram Stain NO WBC SEEN NO ORGANISMS SEEN     Culture      NO GROWTH < 24 HOURS Performed at [facility], [address] [place], [place] [address]    Report Status PENDING   Aerobic/Anaerobic Culture (surgical/deep wound)     Status: None (Preliminary result)   Collection Time: 12/30/18  2:18 PM  Result Value Ref Range   Specimen Description SYNOVIAL    Special Requests SWAB    Gram Stain      FEW WBC PRESENT, PREDOMINANTLY PMN NO ORGANISMS SEEN    Culture      NO GROWTH < 12 HOURS Performed at [facility], [address] [place], [place] [address]    Report Status PENDING   Surgical pathology     Status: None   Collection Time: 12/30/18  2:31 PM  Result Value Ref Range   SURGICAL PATHOLOGY      Surgical Pathology CASE: [id number] PATIENT: Joshua Holder Surgical Pathology Report  SPECIMEN SUBMITTED: A. Elbow abscess, left  CLINICAL HISTORY: None provided  PRE-OPERATIVE DIAGNOSIS: Left elbow infection  POST-OPERATIVE DIAGNOSIS: Same as pre-op     DIAGNOSIS: A. SOFT TISSUE, LEFT OLECRANON BURSA; DEBRIDEMENT: - TOPHACEOUS GOUT WITH ACUTE INFLAMMATION.  Comment: The tissue contains abundant deposits accompanied by both acute inflammation and foreign body granulomatous reaction. Scrape preparation slides from the fresh tissue display abundant needle-shaped crystals with negative birefringence observed under polarized light microscopy using the red compensator. The features indicate chronic tophaceous gout with features resembling an acute flare. However, correlation with pending bacterial cultures is also advised.  GROSS DESCRIPTION: A. Labeled: Left elbow abscess Received: Fresh for gout evaluation Tissue fragment(s): Multiple Size:   Aggregate, 4.3 x 3.5 x 0.7 cm Description: Received are irregular fragments of tan-pink, rubbery soft tissue.  Sectioning displays multifocal areas of pale, friable material (suspicious for gout).  Two unstained scrape touch preps of the pale, friable material is performed on fresh specimen to be evaluated for gout crystals.  No additional abnormalities are grossly identified. Representative sections are submitted for routine histology in cassettes 1-2.   Final Diagnosis performed by Ronald Lobo, MD.   Electronically signed 12/31/2018 11:50:43AM The electronic signature indicates that the named Attending Pathologist has evaluated the specimen  Technical component performed at [facility], [address], [place], [place] [address] Lab: [phone] Dir: Jolene Schimke, MD, MMM  Professional component performed at [facility], [address] [place], [place], [place] [address] Lab: [phone] Dir: Georgiann Cocker. Rubinas, MD   Glucose, capillary     Status: Abnormal   Collection Time: 12/30/18  3:08 PM  Result Value Ref Range   Glucose-Capillary 256 (H) 70 - 99 mg/dL  Glucose, capillary     Status: Abnormal   Collection Time: 12/30/18  4:08 PM  Result Value Ref Range   Glucose-Capillary 262 (H) 70 - 99 mg/dL  Glucose, capillary     Status: Abnormal   Collection Time: 12/30/18  4:28 PM  Result Value Ref Range   Glucose-Capillary 283 (H) 70 - 99 mg/dL  Glucose, capillary     Status: Abnormal   Collection Time: 12/30/18  8:47 PM  Result Value Ref Range   Glucose-Capillary 277 (H) 70 - 99 mg/dL   Comment 1 Notify RN   Glucose, capillary     Status: Abnormal   Collection Time: 12/31/18  7:37 AM  Result Value Ref Range   Glucose-Capillary 268 (H) 70 - 99 mg/dL   Comment 1 Notify RN   Glucose, capillary     Status: Abnormal   Collection Time: 12/31/18 11:32 AM  Result Value Ref Range   Glucose-Capillary 309 (H) 70 - 99 mg/dL   Comment 1 Notify RN     Dg Elbow Complete  Left  Result Date: 12/29/2018 CLINICAL DATA:  Right arm pain for 5 days. Patient diagnosed with gout 5 days ago. EXAM: LEFT ELBOW - COMPLETE 3+ VIEW COMPARISON:  None. FINDINGS: Soft tissue edema identified in the elbow. A joint effusion is noted with displacement of the anterior posterior fat pads. No fractures are seen. IMPRESSION: Joint effusion in the elbow, likely secondary to the patient's reported gout. Soft tissue edema. No fractures or bony erosion noted. Electronically Signed   By: Gerome Sam III M.D   On: 12/29/2018 17:10   Mr Elbow Left W Wo Contrast  Result Date: 12/29/2018 CLINICAL DATA:  Worsening elbow pain, redness, and swelling. History of gout. EXAM: MRI OF THE LEFT ELBOW WITHOUT AND WITH  CONTRAST TECHNIQUE: Multiplanar, multisequence MR imaging of the elbow was performed before and after the administration of intravenous contrast. CONTRAST:  10 mL Gadavist intravenous contrast. COMPARISON:  Left elbow x-rays from same day. FINDINGS: TENDONS Common forearm flexor origin: Intact with normal signal. Common forearm extensor origin: Small partial tear. Biceps: Intact. Triceps: Intact with normal signal. LIGAMENTS Medial stabilizers: Grossly intact, although evaluation is limited due to patient's inability to extend the elbow. Lateral stabilizers: Grossly intact, although evaluation is limited due to patient's inability to extend the elbow. Cartilage: Preserved.  No focal chondral defect demonstrated. Joint: Large joint effusion with thick, enhancing synovitis. No intra-articular body. Cubital tunnel: Unremarkable.  The ulnar nerve appears normal. Bones: No acute or significant extra-articular osseous findings. Other: Diffuse soft tissue edema and enhancement about the elbow. Moderate amount of fluid in the olecranon bursa. Ill-defined 2.2 x 1.3 x 1.1 cm rim enhancing fluid collection in the proximal posterior forearm. Additional ill-defined 2.0 x 2.5 x 3.1 cm rim enhancing fluid collection  in the posterior elbow soft tissues just inferior to the medial epicondyle. IMPRESSION: 1. Extensive cellulitis about the left elbow with small soft tissue abscesses in the proximal posterior forearm and posteromedial elbow. 2. Large elbow joint effusion with thick, enhancing synovitis. Moderate fluid in the olecranon bursa. While these findings could be related to gout, they are more concerning for septic arthritis and olecranon bursitis until proven otherwise given the extensive soft tissue infection surrounding the elbow. 3. Small partial tear of the common forearm extensor tendon origin. Electronically Signed   By: Obie Dredge M.D.   On: 12/29/2018 19:52    Assessment/Plan: 1 Day Post-Op   Active Problems:   Cellulitis  Patient has improved clinically.  He is afebrile with VSS. Patient's gram stain and cultures from OR show no organisms to date.  Continue IV vancomycin.  ID consult pending to determine length of time required for IV antibiotics and recommendations for oral antibiotics when patient is ready for discharge.     Juanell Fairly , MD 12/31/2018, 1:44 PM

## 2018-12-31 NOTE — Consult Note (Signed)
NAME: Joshua Holder  DOB: 05-22-1966  MRN: [id number]  Date/Time: 12/31/2018 1:01 PM  REQUESTING PROVIDER pyreddy Subjective:  REASON FOR CONSULT: cellulitis ? Joshua Holder is a 53 y.o. male with a history of Gout, DM, HTN presents with left elbow pain and swelling and redness of 1 week duration. Pt says he has had on /off left elbow pain which he attributed it to gout for the past many years. In Jan 2020 it got worse and he also moved from [place] to [facility] and went [facility]. 2 weeks ago he had a follow up and as the elbow was swollen he was placed on prednisone for gout. He went back a week ago and was started on bactrim for possible olecranon bursitis. On Wednesday he went to see his ortho and was sent to the ED for worsening swelling and redness. He did not have any fever or chills. He works for [employer]. He has no trauma On admission his vitals were 98.8, BP 135/91, pulse 86. MRI of the elbow showed Extensive cellulitis about the left elbow with small soft tissue abscesses in the proximal posterior forearm and posteromedial elbow.2. Large elbow joint effusion with thick, enhancing synovitis.Moderate fluid in the olecranon bursa.  He had aspiration of the left elbow from ED and fluid showed Monosodium urate crystals, WBC of 28K ( 91% N) neg gram stain . He was started on vanco- Culture was negative- He was taken for surgery on 4/21 by Dr.Krasinski and underwent excision of left elbow bursa, drainage of multiple collection of caseous material and drainage and irrigation of the left elbow joint PMH DM HTN GOUT   Past Surgical History:  Procedure Laterality Date  . INCISION AND DRAINAGE Left 12/30/2018   Procedure: INCISION AND DRAINAGE LEFT ELBOW;  Surgeon: Juanell Fairly, MD;  Location: [facility] ORS;  Service: Orthopedics;  Laterality: Left;    Social History   Socioeconomic History  . Marital status: Single    Spouse name: Not on file  . Number  of children: Not on file  . Years of education: Not on file  . Highest education level: Not on file  Occupational History  . Not on file  Social Needs  . Financial resource strain: Not on file  . Food insecurity:    Worry: Never true    Inability: Never true  . Transportation needs:    Medical: No    Non-medical: No  Tobacco Use  . Smoking status: Never Smoker  . Smokeless tobacco: Never Used  Substance and Sexual Activity  . Alcohol use: Yes    Comment: social  . Drug use: Not Currently  . Sexual activity: Yes  Lifestyle  . Physical activity:    Days per week: 3 days    Minutes per session: Not on file  . Stress: To some extent  Relationships  . Social connections:    Talks on phone: More than three times a week    Gets together: Three times a week    Attends religious service: Not on file    Active member of club or organization: Not on file    Attends meetings of clubs or organizations: Not on file    Relationship status: Not on file  . Intimate partner violence:    Fear of current or ex partner: Not on file    Emotionally abused: Not on file    Physically abused: Not on file    Forced sexual activity: Not on file  Other Topics Concern  . Not on file  Social History Narrative  . Not on file    History reviewed. No pertinent family history. Allergies  Allergen Reactions  . Lisinopril Swelling and Cough   ? Current Facility-Administered Medications  Medication Dose Route Frequency Provider Last Rate Last Dose  . 0.9 %  sodium chloride infusion  250 mL Intravenous PRN Juanell Fairly, MD 0 mL/hr at 12/30/18 0506    . 0.9 %  sodium chloride infusion   Intravenous Continuous Juanell Fairly, MD 75 mL/hr at 12/31/18 0557    . acetaminophen (TYLENOL) tablet 650 mg  650 mg Oral Q6H PRN Juanell Fairly, MD       Or  . acetaminophen (TYLENOL) suppository 650 mg  650 mg Rectal Q6H PRN Juanell Fairly, MD      . allopurinol (ZYLOPRIM) tablet 100 mg  100 mg Oral  Daily Juanell Fairly, MD   100 mg at 12/31/18 0858  . enoxaparin (LOVENOX) injection 40 mg  40 mg Subcutaneous Q24H Juanell Fairly, MD   40 mg at 12/30/18 2123  . hydrALAZINE (APRESOLINE) injection 10 mg  10 mg Intravenous Q6H PRN Milagros Loll, MD      . HYDROcodone-acetaminophen (NORCO/VICODIN) 5-325 MG per tablet 1-2 tablet  1-2 tablet Oral Q4H PRN Juanell Fairly, MD   2 tablet at 12/31/18 [phone]  . insulin aspart (novoLOG) injection 0-20 Units  0-20 Units Subcutaneous TID WC Juanell Fairly, MD   15 Units at 12/31/18 1235  . insulin aspart (novoLOG) injection 0-5 Units  0-5 Units Subcutaneous QHS Juanell Fairly, MD   3 Units at 12/30/18 2124  . insulin glargine (LANTUS) injection 10 Units  10 Units Subcutaneous Daily [name], MD   10 Units at 12/31/18 1235  . morphine 2 MG/ML injection 2 mg  2 mg Intravenous Q4H PRN Juanell Fairly, MD   2 mg at 12/29/18 1933  . ondansetron (ZOFRAN) tablet 4 mg  4 mg Oral Q6H PRN Juanell Fairly, MD       Or  . ondansetron [facility]) injection 4 mg  4 mg Intravenous Q6H PRN Juanell Fairly, MD      . senna-docusate (Senokot-S) tablet 1 tablet  1 tablet Oral QHS PRN Juanell Fairly, MD      . sodium chloride flush (NS) 0.9 % injection 3 mL  3 mL Intravenous Q12H Juanell Fairly, MD   3 mL at 12/31/18 0900  . sodium chloride flush (NS) 0.9 % injection 3 mL  3 mL Intravenous PRN Juanell Fairly, MD      . vancomycin (VANCOCIN) 1,250 mg in sodium chloride 0.9 % 250 mL IVPB  1,250 mg Intravenous Q12H Juanell Fairly, MD 166.7 mL/hr at 12/31/18 0916 1,250 mg at 12/31/18 0916     Abtx:  Anti-infectives (From admission, onward)   Start     Dose/Rate Route Frequency Ordered Stop   12/30/18 1342  gentamicin (GARAMYCIN) 80 mg in sodium chloride 0.9 % 500 mL irrigation  Status:  Discontinued       As needed 12/30/18 1342 12/30/18 1458   12/30/18 1300  ceFAZolin (ANCEF) IVPB 2g/100 mL premix     2 g 200 mL/hr over 30 Minutes Intravenous 30 min  pre-op 12/30/18 1254 12/30/18 1343   12/30/18 1239  ceFAZolin (ANCEF) 2-4 GM/100ML-% IVPB    Note to Pharmacy:  Dellis Filbert   : cabinet override      12/30/18 1239 12/30/18 1313   12/30/18 1000  vancomycin (VANCOCIN) 1,250 mg in sodium chloride  0.9 % 250 mL IVPB     1,250 mg 166.7 mL/hr over 90 Minutes Intravenous Every 12 hours 12/30/18 0739     12/29/18 2200  vancomycin (VANCOCIN) IVPB 1000 mg/200 mL premix  Status:  Discontinued     1,000 mg 200 mL/hr over 60 Minutes Intravenous Every 12 hours 12/29/18 1812 12/29/18 1813   12/29/18 2000  vancomycin (VANCOCIN) IVPB 1000 mg/200 mL premix  Status:  Discontinued     1,000 mg 200 mL/hr over 60 Minutes Intravenous Every 12 hours 12/29/18 1813 12/30/18 0739   12/29/18 1645  vancomycin (VANCOCIN) IVPB 1000 mg/200 mL premix     1,000 mg 200 mL/hr over 60 Minutes Intravenous  Once 12/29/18 1633 12/29/18 1810      REVIEW OF SYSTEMS:  Const: negative fever, negative chills, negative weight loss Eyes: negative diplopia or visual changes, negative eye pain ENT: negative coryza, negative sore throat Resp: negative cough, hemoptysis, dyspnea Cards: negative for chest pain, palpitations, lower extremity edema GU: negative for frequency, dysuria and hematuria GI: Negative for abdominal pain, diarrhea, bleeding, constipation Skin: negative for rash and pruritus Heme: negative for easy bruising and gum/nose bleeding MS: left el;bow swelling and pain Also has intermittent pain and swelling hands Neurolo:negative for headaches, dizziness, vertigo, memory problems  Psych: negative for feelings of anxiety, depression  Endocrine: negative for thyroid, diabetes Allergy/Immunology- negative for any medication or food allergies  Objective:  VITALS:  BP 117/89 (BP Location: Right Arm)   Pulse 80   Temp (!) 97.5 F (36.4 C) (Oral)   Resp 18   Ht 5\' 11"  (1.803 m)   Wt 121.1 kg   SpO2 97%   BMI 37.24 kg/m  PHYSICAL EXAM:  General: Alert,  cooperative, no distress, appears stated age.  Head: Normocephalic, without obvious abnormality, atraumatic. Eyes: Conjunctivae clear, anicteric sclerae. Pupils are equal ENT Nares normal. No drainage or sinus tenderness. Lips, mucosa, and tongue normal. No Thrush Neck: Supple, symmetrical, no adenopathy, thyroid: non tender no carotid bruit and no JVD. Back: No CVA tenderness. Lungs: Clear to auscultation bilaterally. No Wheezing or Rhonchi. No rales. Heart: Regular rate and rhythm, no murmur, rub or gallop. Abdomen: Soft, non-tender,not distended. Bowel sounds normal. No masses Extremities:left elbow- surgical incision intact , no erythema or discharge, some swelling around, able to extend the elbow but limited- edema of the hand  12/29/18   12/31/18     Skin: No rashes or lesions. Or bruising Lymph: Cervical, supraclavicular normal. Neurologic: Grossly non-focal Pertinent Labs Lab Results CBC    Component Value Date/Time   WBC 7.7 12/30/2018 0253   RBC 4.22 12/30/2018 0253   HGB 12.3 (L) 12/30/2018 0253   HCT 36.8 (L) 12/30/2018 0253   PLT 265 12/30/2018 0253   MCV 87.2 12/30/2018 0253   MCH 29.1 12/30/2018 0253   MCHC 33.4 12/30/2018 0253   RDW 12.4 12/30/2018 0253    CMP Latest Ref Rng & Units 12/30/2018 12/29/2018  Glucose 70 - 99 mg/dL 329(J) 188(C)  BUN 6 - 20 mg/dL 16 15  Creatinine 1.66 - 1.24 mg/dL 0.63 0.16  Sodium 010 - 145 mmol/L 131(L) 133(L)  Potassium 3.5 - 5.1 mmol/L 4.8 4.1  Chloride 98 - 111 mmol/L 96(L) 96(L)  CO2 22 - 32 mmol/L 23 24  Calcium 8.9 - 10.3 mg/dL 9.3(A) 3.5(T)      Microbiology: Recent Results (from the past 240 hour(s))  Blood culture (single)     Status: None (Preliminary result)   Collection Time: 12/29/18  4:50 PM  Result Value Ref Range Status   Specimen Description BLOOD RIGHT ANTECUBITAL  Final   Special Requests   Final    BOTTLES DRAWN AEROBIC AND ANAEROBIC Blood Culture adequate volume   Culture   Final    NO  GROWTH 2 DAYS Performed at [facility], [address], [place], [place] [address]    Report Status PENDING  Incomplete  Body fluid culture     Status: None (Preliminary result)   Collection Time: 12/29/18  5:42 PM  Result Value Ref Range Status   Specimen Description SYNOVIAL  Final   Special Requests LEFT ELBOW  Final   Gram Stain   Final    ABUNDANT WBC PRESENT,BOTH PMN AND MONONUCLEAR NO ORGANISMS SEEN    Culture   Final    NO GROWTH 2 DAYS Performed at [facility], [address] [place], [place] [address]    Report Status PENDING  Incomplete  Aerobic/Anaerobic Culture (surgical/deep wound)     Status: None (Preliminary result)   Collection Time: 12/30/18  2:01 PM  Result Value Ref Range Status   Specimen Description   Final    ABSCESS Performed at [facility], [address], [place], [place] [address]    Special Requests   Final    LEFT OLECRANON BURSA Performed at [facility], [address] [place], [place] [address]    Gram Stain NO WBC SEEN NO ORGANISMS SEEN   Final   Culture   Final    NO GROWTH < 24 HOURS Performed at [facility], [address], [place], [place] [address]    Report Status PENDING  Incomplete  Aerobic/Anaerobic Culture (surgical/deep wound)     Status: None (Preliminary result)   Collection Time: 12/30/18  2:01 PM  Result Value Ref Range Status   Specimen Description   Final    ABSCESS Performed at [facility], [address], [place], [place] [address]    Special Requests   Final    LEFT ELBOW Performed at [facility], [address] [place], [place] [address]    Gram Stain NO WBC SEEN NO ORGANISMS SEEN   Final   Culture   Final    NO GROWTH < 24 HOURS Performed at [facility], [address], [place], [place] [address]    Report Status PENDING  Incomplete  Aerobic/Anaerobic Culture (surgical/deep wound)     Status: None (Preliminary result)    Collection Time: 12/30/18  2:18 PM  Result Value Ref Range Status   Specimen Description SYNOVIAL  Final   Special Requests SWAB  Final   Gram Stain   Final    FEW WBC PRESENT, PREDOMINANTLY PMN NO ORGANISMS SEEN    Culture   Final    NO GROWTH < 12 HOURS Performed at [facility], [address] [place], [place] [address]    Report Status PENDING  Incomplete    IMAGING RESULTS: MRI  I have personally reviewed the films ? Impression/Recommendation ?53 y.o. male with a history of Gout, DM, HTN presents with left elbow pain and swelling and redness of 1 week duration.  ?Olecranon bursitis  With cellulitis and  acute gouty tophi  of left elbow Cultures are negative but he was on bactrim for 5 days before the aspiration was done on 12/29/18. Afetr that he was started on vanco-I doubt he has septic arthritis but will treat the cellulitis and olecranon  bursitis with antibiotic- linezolid is a good option as it will cover MSSA, MRSA , strep. Gram neg infection is unlikely. The other antibiotic option is bactrim+ augmentin- Pt prefers linezolid and I discussed side effects including serotonin syndrome and foods to avoid- gave him material on linezold Pharmacist will arrange for him to get linezolid thru [facility] coupon. Will give for 2 weeks  Dm on metformin- watch for increased lactate with linezolid  He will need labs in 1 week- when he goes to see Dr.krasinski (CBC with diff, CMP, lactate level)  ___________________________________________________ Discussed with patient, and care team

## 2019-01-01 LAB — GLUCOSE, CAPILLARY: Glucose-Capillary: 146 mg/dL — ABNORMAL HIGH (ref 70–99)

## 2019-01-01 MED ORDER — LINEZOLID 600 MG PO TABS: 600.0000 mg | ORAL_TABLET | Freq: Two times a day (BID) | ORAL | 0 refills | Status: AC

## 2019-01-01 MED ORDER — LINEZOLID 600 MG PO TABS
600.0000 mg | ORAL_TABLET | Freq: Two times a day (BID) | ORAL | Status: DC
  Administered 2019-01-01: 600 mg via ORAL
  Filled 2019-01-01 (×2): qty 1

## 2019-01-01 MED ORDER — HYDROCODONE-ACETAMINOPHEN 5-325 MG PO TABS: 1.0000 | ORAL_TABLET | Freq: Four times a day (QID) | ORAL | 0 refills | Status: AC | PRN

## 2019-01-01 NOTE — Progress Notes (Signed)
  Subjective:  POD #2 s/p I&D left elbow.   Patient reports left elbow pain as mild.  No fevers or chills.  Objective:   VITALS:   Vitals:   12/31/18 0512 12/31/18 1257 12/31/18 1943 01/01/19 0453  BP: (!) 118/92 117/89 116/83 116/86  Pulse: 68 80 68 65  Resp: 18 18 20 20   Temp: 98.2 F (36.8 C) (!) 97.5 F (36.4 C) 98.1 F (36.7 C) 97.8 F (36.6 C)  TempSrc: Oral Oral Oral Oral  SpO2: 96% 97% 97% 97%  Weight:      Height:        PHYSICAL EXAM: Left extremity: I personally change the patient's dressing today.  I put a honeycomb dressing over the incision and wrapped ABDs and web roll around the elbow with an overlying Ace wrap.  Patient can flex and extend all 5 digits of the left hand fully.  He has full wrist range of motion.  Patient can flex and extend his elbow without significant pain.  Range of motion is still limited due to postsurgical pain. Neurovascular intact Sensation intact distally Intact pulses distally No cellulitis present Compartment soft  LABS  Results for orders placed or performed during the hospital encounter of 12/29/18 (from the past 24 hour(s))  Glucose, capillary     Status: Abnormal   Collection Time: 12/31/18 11:32 AM  Result Value Ref Range   Glucose-Capillary 309 (H) 70 - 99 mg/dL   Comment 1 Notify RN   Glucose, capillary     Status: Abnormal   Collection Time: 12/31/18  4:38 PM  Result Value Ref Range   Glucose-Capillary 188 (H) 70 - 99 mg/dL   Comment 1 Notify RN   Glucose, capillary     Status: Abnormal   Collection Time: 12/31/18  9:49 PM  Result Value Ref Range   Glucose-Capillary 237 (H) 70 - 99 mg/dL  Glucose, capillary     Status: Abnormal   Collection Time: 01/01/19  7:40 AM  Result Value Ref Range   Glucose-Capillary 146 (H) 70 - 99 mg/dL   Comment 1 Notify RN     No results found.  Assessment/Plan: 2 Days Post-Op   Active Problems:   Cellulitis  Is doing well postop.  He will be discharged on Linezolid per  infectious disease.  Patient will follow up with me next Monday and will have labs drawn at [facility] on Monday as well.  Case has been discussed with Dr. Tobi Bastos and Dr. Rivka Safer.    Juanell Fairly , MD 01/01/2019, 10:41 AM

## 2019-01-01 NOTE — Progress Notes (Addendum)
Pharmacy - Antimicrobial Stewardship  Asked to investigate linezolid for discharge.  14-day script sent to [facility] and received denial (prior auth required) per pharmacist.  Thus, investigated [facility] - found coupon for $86.70 for 14 days at [facility].  Patient was agreeable to this plan as has FSA card to pay for it.    Coupon placed and chart and discharging physician aware to print linezolid script.  RN aware to give coupon and scrip to patient at discharge.   I also reviewed dosing, possible side effects and monitoring with patient while in room.  Juliette Alcide, PharmD, BCPS.   Work Cell: [phone] 01/01/2019 9:27 AM

## 2019-01-01 NOTE — Progress Notes (Signed)
[employer] [facility] [place] at [facility] Joshua Holder was admitted to the Hospital on 12/29/2018 and Discharged  01/01/2019 and should be excused from work/school for  days starting 12/29/2018 , may return to work/school without any restrictions from 01/08/2019.  Call [name] MD with questions.  [name] M.D on 01/01/2019,at 12:09 PM  [employer] [facility] [place] at [facility]  [phone]

## 2019-01-01 NOTE — Discharge Summary (Signed)
[facility] [place] [facility]   PATIENT NAME: Joshua Holder    MR#:  [id number]  DATE OF BIRTH:  [birth year]  DATE OF ADMISSION:  12/29/2018 ADMITTING PHYSICIAN: Ihor AustinPavan Shauna Bodkins, MD  DATE OF DISCHARGE: 01/01/2019  PRIMARY CARE PHYSICIAN: System, Provider Not In   ADMISSION DIAGNOSIS:  Elbow effusion, left [M25.422]  DISCHARGE DIAGNOSIS:  Left elbow cellulitis Olecranon bursitis with cellulitis and acute gouty tophi left elbow Gout Type 2 diabetes mellitus Hypertension  SECONDARY DIAGNOSIS:   Past Medical History:  Diagnosis Date  . 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 Gout   . Hypertension      ADMITTING HISTORY  Joshua Holder  is a 53 y.o. male with a known history of gout, hypertension presented to the emergency room with pain in the left elbow going on for couple of days.  Patient has some attacks of gout in the past.  He has swelling and redness and erythema in the left elbow.  Left elbow is aching in nature 8 out of 10 on a scale of 1-10.  He is currently on oral steroids and allopurinol for treatment of gout.  He was also started on oral Septra for possible olecranon bursitis.  He was referred to ER by orthopedic attending from the emergency room.  Patient had arthrocentesis of the left elbow joint by ER physician. No complains of any fever, shortness of breath, cough, sore throat.  No recent travel  HOSPITAL COURSE:  Patient was admitted to medical floor and started on broad-spectrum antibiotics that is vancomycin.  He had elbow joint arthrocentesis in the ER for evaluation of septic arthritis.  Cultures did not reveal any growth.  Patient was evaluated by orthopedic surgeon during the hospitalization.  Infectious disease consultation was also done for guidance on antibiotics.  Patient tolerated IV antibiotics well swelling in the elbow improved pain also improved.  Plan was to discharge patient on oral linezolid for 14 days.  Antibiotics were  procured with the help of pharmacy.  Patient will be discharged with outpatient follow-up with orthopedics and infectious disease.  Patient will continue oral allopurinol and oral steroids for gout and follow-up with primary care physician.  Elbow cellulitis improved.Body fluid cultures and blood cultures did not reveal any growth. CONSULTS OBTAINED:    DRUG ALLERGIES:   Allergies  Allergen Reactions  . Lisinopril Swelling and Cough    DISCHARGE MEDICATIONS:   Allergies as of 01/01/2019      Reactions   Lisinopril Swelling, Cough      Medication List    TAKE these medications   allopurinol 100 MG tablet Commonly known as:  ZYLOPRIM Take 100 mg by mouth daily.   HYDROcodone-acetaminophen 5-325 MG tablet Commonly known as:  NORCO/VICODIN Take 1 tablet by mouth every 6 (six) hours as needed for up to 5 days for moderate pain.   linezolid 600 MG tablet Commonly known as:  ZYVOX Take 1 tablet (600 mg total) by mouth every 12 (twelve) hours for 14 days.   metFORMIN 500 MG 24 hr tablet Commonly known as:  GLUCOPHAGE-XR Take 1,000 mg by mouth daily with breakfast.   predniSONE 20 MG tablet Commonly known as:  DELTASONE prednisone 20 mg tablet       Today  Patient seen today No fever No shortness of breath Pain in the left elbow decreased Swelling left elbow improved Hemodynamically stable  VITAL SIGNS:  Blood pressure 116/86, pulse 65, temperature 97.8 F (36.6 C), temperature  source Oral, resp. rate 20, height 5\' 11"  (1.803 m), weight 121.1 kg, SpO2 97 %.  I/O:    Intake/Output Summary (Last 24 hours) at 01/01/2019 1046 Last data filed at 01/01/2019 1040 Gross per 24 hour  Intake 2156.79 ml  Output 3950 ml  Net -1793.21 ml    PHYSICAL EXAMINATION:  Physical Exam  GENERAL:  53 y.o.-year-old patient lying in the bed with no acute distress.  LUNGS: Normal breath sounds bilaterally, no wheezing, rales,rhonchi or crepitation. No use of accessory muscles of  respiration.  CARDIOVASCULAR: S1, S2 normal. No murmurs, rubs, or gallops.  ABDOMEN: Soft, non-tender, non-distended. Bowel sounds present. No organomegaly or mass.  NEUROLOGIC: Moves all 4 extremities. PSYCHIATRIC: The patient is alert and oriented x 3.  SKIN: No obvious rash, lesion, or ulcer.   DATA REVIEW:   CBC Recent Labs  Lab 12/30/18 0253  WBC 7.7  HGB 12.3*  HCT 36.8*  PLT 265    Chemistries  Recent Labs  Lab 12/30/18 0253  NA 131*  K 4.8  CL 96*  CO2 23  GLUCOSE 455*  BUN 16  CREATININE 1.04  CALCIUM 8.7*    Cardiac Enzymes No results for input(s): TROPONINI in the last 168 hours.  Microbiology Results  Results for orders placed or performed during the hospital encounter of 12/29/18  Blood culture (single)     Status: None (Preliminary result)   Collection Time: 12/29/18  4:50 PM  Result Value Ref Range Status   Specimen Description BLOOD RIGHT ANTECUBITAL  Final   Special Requests   Final    BOTTLES DRAWN AEROBIC AND ANAEROBIC Blood Culture adequate volume   Culture   Final    NO GROWTH 3 DAYS Performed at [facility], [address], [place], [place] [address]    Report Status PENDING  Incomplete  Body fluid culture     Status: None (Preliminary result)   Collection Time: 12/29/18  5:42 PM  Result Value Ref Range Status   Specimen Description SYNOVIAL  Final   Special Requests LEFT ELBOW  Final   Gram Stain   Final    ABUNDANT WBC PRESENT,BOTH PMN AND MONONUCLEAR NO ORGANISMS SEEN    Culture   Final    NO GROWTH 3 DAYS Performed at [facility], [address] [place], [place] [address]    Report Status PENDING  Incomplete  Aerobic/Anaerobic Culture (surgical/deep wound)     Status: None (Preliminary result)   Collection Time: 12/30/18  2:01 PM  Result Value Ref Range Status   Specimen Description   Final    ABSCESS Performed at [facility], [address] [place], [place] [address]    Special Requests    Final    LEFT OLECRANON BURSA Performed at [facility], [address] [place], [place] [address]    Gram Stain NO WBC SEEN NO ORGANISMS SEEN   Final   Culture   Final    NO GROWTH < 24 HOURS Performed at [facility], [address] [place], [place] [address]    Report Status PENDING  Incomplete  Aerobic/Anaerobic Culture (surgical/deep wound)     Status: None (Preliminary result)   Collection Time: 12/30/18  2:01 PM  Result Value Ref Range Status   Specimen Description   Final    ABSCESS Performed at [facility], [address] [place], [place] [address]    Special Requests   Final    LEFT ELBOW Performed  at [facility], [address] [place], [place] [address]    Gram Stain NO WBC SEEN NO ORGANISMS SEEN   Final   Culture   Final    NO GROWTH < 24 HOURS Performed at [facility], [address], [place], [place] [address]    Report Status PENDING  Incomplete  Aerobic/Anaerobic Culture (surgical/deep wound)     Status: None (Preliminary result)   Collection Time: 12/30/18  2:18 PM  Result Value Ref Range Status   Specimen Description SYNOVIAL  Final   Special Requests SWAB  Final   Gram Stain   Final    FEW WBC PRESENT, PREDOMINANTLY PMN NO ORGANISMS SEEN    Culture   Final    NO GROWTH 2 DAYS NO ANAEROBES ISOLATED; CULTURE IN PROGRESS FOR 5 DAYS Performed at [facility], [address], [place], [place] [address]    Report Status PENDING  Incomplete    RADIOLOGY:  No results found.  Follow up with PCP in 1 week.  Management plans discussed with the patient, family and they are in agreement.  CODE STATUS: Full code    Code Status Orders  (From admission, onward)         Start     Ordered   12/29/18 2008  Full code  Continuous     12/29/18 2007        Code Status History    This patient has a current code status but no historical code status.      TOTAL TIME TAKING CARE OF THIS PATIENT ON  DAY OF DISCHARGE: more than 35 minutes.   [name] M.D on 01/01/2019 at 10:46 AM  Between 7am to 6pm - Pager - [phone]  After 6pm go to [email or web address] - password [email or web address]  [facility] [place] [facility]  Office  [phone]  CC: Primary care physician; System, Provider Not In  Note: This dictation was prepared with Dragon dictation along with smaller phrase technology. Any transcriptional errors that result from this process are unintentional.

## 2019-01-01 NOTE — Progress Notes (Signed)
Discharge teaching done with patient, AVS reviewed, including Rx pickup.  Patient expressed understanding.  L elbow dressing changed by Dr. Dario Ave, f/u appt scheduled, pt aware.  PIV removed, no issues.  Pt d/c'ed by [facility] by nursing to family at hospital entrance.  No complaints.

## 2019-01-02 LAB — BODY FLUID CULTURE: Culture: NO GROWTH

## 2019-01-03 LAB — CULTURE, BLOOD (SINGLE)
Culture: NO GROWTH
Special Requests: ADEQUATE

## 2019-01-03 NOTE — Anesthesia Postprocedure Evaluation (Signed)
Anesthesia Post Note  Patient: Joshua Holder  Procedure(s) Performed: INCISION AND DRAINAGE LEFT ELBOW (Left Elbow)  Patient location during evaluation: PACU Anesthesia Type: General Level of consciousness: awake and alert and oriented Pain management: pain level controlled Vital Signs Assessment: post-procedure vital signs reviewed and stable Respiratory status: spontaneous breathing Cardiovascular status: blood pressure returned to baseline Anesthetic complications: no     Last Vitals:  Vitals:   12/31/18 1943 01/01/19 0453  BP: 116/83 116/86  Pulse: 68 65  Resp: 20 20  Temp: 36.7 C 36.6 C  SpO2: 97% 97%    Last Pain:  Vitals:   01/01/19 0755  TempSrc:   PainSc: 5                  Agata Lucente

## 2019-01-04 LAB — AEROBIC/ANAEROBIC CULTURE W GRAM STAIN (SURGICAL/DEEP WOUND)
Culture: NO GROWTH
Culture: NO GROWTH
Culture: NO GROWTH
Gram Stain: NONE SEEN
Gram Stain: NONE SEEN

## 2019-01-04 LAB — AEROBIC/ANAEROBIC CULTURE (SURGICAL/DEEP WOUND)

## 2019-01-05 ENCOUNTER — Other Ambulatory Visit
Admission: RE | Admit: 2019-01-05 | Discharge: 2019-01-05 | Disposition: A | Discharge status: Home / Self Care | Payer: BLUE CROSS/BLUE SHIELD | Source: Ambulatory Visit | Attending: Orthopedic Surgery | Admitting: Orthopedic Surgery

## 2019-01-05 DIAGNOSIS — M71122 Other infective bursitis, left elbow: Secondary | ICD-10-CM | POA: Diagnosis not present

## 2019-01-05 LAB — CBC WITH DIFFERENTIAL/PLATELET
Abs Immature Granulocytes: 0.04 10*3/uL (ref 0.00–0.07)
Basophils Absolute: 0.1 10*3/uL (ref 0.0–0.1)
Basophils Relative: 1 %
Eosinophils Absolute: 0.3 10*3/uL (ref 0.0–0.5)
Eosinophils Relative: 4 %
HCT: 36.5 % — ABNORMAL LOW (ref 39.0–52.0)
Hemoglobin: 12.4 g/dL — ABNORMAL LOW (ref 13.0–17.0)
Immature Granulocytes: 1 %
Lymphocytes Relative: 31 %
Lymphs Abs: 1.9 10*3/uL (ref 0.7–4.0)
MCH: 28.8 pg (ref 26.0–34.0)
MCHC: 34 g/dL (ref 30.0–36.0)
MCV: 84.9 fL (ref 80.0–100.0)
Monocytes Absolute: 0.4 10*3/uL (ref 0.1–1.0)
Monocytes Relative: 6 %
Neutro Abs: 3.6 10*3/uL (ref 1.7–7.7)
Neutrophils Relative %: 57 %
Platelets: 318 10*3/uL (ref 150–400)
RBC: 4.3 MIL/uL (ref 4.22–5.81)
RDW: 12.2 % (ref 11.5–15.5)
WBC: 6.3 10*3/uL (ref 4.0–10.5)
nRBC: 0 % (ref 0.0–0.2)

## 2019-01-05 LAB — BASIC METABOLIC PANEL
Anion gap: 9 (ref 5–15)
BUN: 15 mg/dL (ref 6–20)
CO2: 24 mmol/L (ref 22–32)
Calcium: 9.5 mg/dL (ref 8.9–10.3)
Chloride: 101 mmol/L (ref 98–111)
Creatinine, Ser: 1.04 mg/dL (ref 0.61–1.24)
GFR calc Af Amer: >60 mL/min (ref >60)
GFR calc non Af Amer: >60 mL/min (ref >60)
Glucose, Bld: 164 mg/dL — ABNORMAL HIGH (ref 70–99)
Potassium: 4.2 mmol/L (ref 3.5–5.1)
Sodium: 134 mmol/L — ABNORMAL LOW (ref 135–145)

## 2019-01-05 LAB — LACTIC ACID, PLASMA: Lactic Acid, Venous: 1.5 mmol/L (ref 0.5–1.9)

## 2019-01-13 ENCOUNTER — Ambulatory Visit: Payer: BLUE CROSS/BLUE SHIELD | Attending: Infectious Diseases | Admitting: Infectious Diseases

## 2019-01-13 ENCOUNTER — Other Ambulatory Visit: Payer: Self-pay

## 2019-01-13 DIAGNOSIS — Z79899 Other long term (current) drug therapy: Secondary | ICD-10-CM

## 2019-01-13 DIAGNOSIS — M109 Gout, unspecified: Secondary | ICD-10-CM | POA: Insufficient documentation

## 2019-01-13 DIAGNOSIS — M7022 Olecranon bursitis, left elbow: Secondary | ICD-10-CM | POA: Diagnosis not present

## 2019-01-13 DIAGNOSIS — Z888 Allergy status to other drugs, medicaments and biological substances status: Secondary | ICD-10-CM

## 2019-01-13 DIAGNOSIS — L03114 Cellulitis of left upper limb: Secondary | ICD-10-CM

## 2019-01-13 DIAGNOSIS — M10022 Idiopathic gout, left elbow: Secondary | ICD-10-CM | POA: Diagnosis not present

## 2019-01-13 DIAGNOSIS — E119 Type 2 diabetes mellitus without complications: Secondary | ICD-10-CM | POA: Insufficient documentation

## 2019-01-13 DIAGNOSIS — I10 Essential (primary) hypertension: Secondary | ICD-10-CM

## 2019-01-13 DIAGNOSIS — Z792 Long term (current) use of antibiotics: Secondary | ICD-10-CM

## 2019-01-13 DIAGNOSIS — Z7984 Long term (current) use of oral hypoglycemic drugs: Secondary | ICD-10-CM

## 2019-01-13 NOTE — Progress Notes (Signed)
NAME: Joshua Holder  DOB: 07/13/1966  MRN: [id number]  Date/Time: 01/13/2019 12:01 PM    The purpose of this virtual visit is to provide medical care while limiting exposure to the novel coronavirus (COVID19) for both patient and office staff.   Consent was obtained for Video visit:  Yes.   Answered questions that patient had about telehealth interaction:  Yes.   I discussed the limitations, risks, security and privacy concerns of performing an evaluation and management service by telephone. I also discussed with the patient that there may be a patient responsible charge related to this service. The patient expressed understanding and agreed to proceed.   Patient Location: Home Provider Location: office  Follow up after recent hospitalization ? Joshua Holder is a 52 y.o. male with a history of DM , GOUT, HTN was recently in [facility] for left olecranon bursitis and cellulitis and underwent I/D and the fluid was chalky and positive for Gout crystals. Culture was negative. HE was discharged home on 4/23 on linezolid for 2 weeks. HE is doing well, Left elbow swelling has improved- some bleeding from the surgical site. still has pain at night time, no fever, saw Ortho last week and has follow up on Friday for possible staples removal Past Medical History:  Diagnosis Date  . 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 Gout   . Hypertension     Past Surgical History:  Procedure Laterality Date  . INCISION AND DRAINAGE Left 12/30/2018   Procedure: INCISION AND DRAINAGE LEFT ELBOW;  Surgeon: Juanell Fairly, MD;  Location: [facility] ORS;  Service: Orthopedics;  Laterality: Left;    SH Non smoker   No family history on file. Allergies  Allergen Reactions  . Lisinopril Swelling and Cough    Current Outpatient Medications  Medication Sig Dispense Refill  . allopurinol (ZYLOPRIM) 100 MG tablet Take 100 mg by mouth daily.    Marland Kitchen linezolid (ZYVOX) 600 MG tablet Take 1 tablet (600 mg total) by mouth  every 12 (twelve) hours for 14 days. 28 tablet 0  . metFORMIN (GLUCOPHAGE-XR) 500 MG 24 hr tablet Take 1,000 mg by mouth daily with breakfast.    . predniSONE (DELTASONE) 20 MG tablet prednisone 20 mg tablet     No current facility-administered medications for this visit.       REVIEW OF SYSTEMS:  Const: negative fever, negative chills, negative weight loss Eyes: negative diplopia or visual changes, negative eye pain ENT: negative coryza, negative sore throat Resp: negative cough, hemoptysis, dyspnea Cards: negative for chest pain, palpitations, lower extremity edema GU: negative for frequency, dysuria and hematuria GI: Negative for abdominal pain, diarrhea, bleeding, constipation Skin: negative for rash and pruritus Heme: negative for easy bruising and gum/nose bleeding MS: negative for myalgias, arthralgias, back pain and muscle weakness Neurolo:negative for headaches, dizziness, vertigo, memory problems  Psych: negative for feelings of anxiety, depression  Endocrine: negative for thyroid, diabetes Allergy/Immunology- lisinopril?  Objective:  VITALS:  There were no vitals taken for this visit. PHYSICAL EXAM:  General: Alert, cooperative, no distress, appears stated age.  Head: Normocephalic, without obvious abnormality, atraumatic. Eyes: ENT Neck: Back: Lungs: Heart: Abdomen: Cannot be examined Extremities:   01/13/19-some swelling      . Neurologic: Grossly non-focal Pertinent Labs Lab Results CBC    Component Value Date/Time   WBC 6.3 01/05/2019 1540   RBC 4.30 01/05/2019 1540   HGB 12.4 (L) 01/05/2019 1540   HCT 36.5 (L) 01/05/2019 1540   PLT 318 01/05/2019  1540   MCV 84.9 01/05/2019 1540   MCH 28.8 01/05/2019 1540   MCHC 34.0 01/05/2019 1540   RDW 12.2 01/05/2019 1540   LYMPHSABS 1.9 01/05/2019 1540   MONOABS 0.4 01/05/2019 1540   EOSABS 0.3 01/05/2019 1540   BASOSABS 0.1 01/05/2019 1540    CMP Latest Ref Rng & Units 01/05/2019 12/30/2018 12/29/2018   Glucose 70 - 99 mg/dL 983(J) 825(K) 539(J)  BUN 6 - 20 mg/dL 15 16 15   Creatinine 0.61 - 1.24 mg/dL 6.73 4.19 3.79  Sodium 135 - 145 mmol/L 134(L) 131(L) 133(L)  Potassium 3.5 - 5.1 mmol/L 4.2 4.8 4.1  Chloride 98 - 111 mmol/L 101 96(L) 96(L)  CO2 22 - 32 mmol/L 24 23 24   Calcium 8.9 - 10.3 mg/dL 9.5 0.2(I) 0.9(B)   ? Impression/Recommendation ?53 y.o. male with a history of Gout, DM, HTN presents with left elbow pain and swelling and redness of 1 week duration.  ?Olecranon bursitis  With cellulitis and  acute gouty tophi  of left elbow S/p I?D -culture negative- currently on linezolid and will finish 2 weeks on Friday 01/16/19 Has an appt that day wih ortho Dr.Krasnowski. Will discuss  with him regarding the need for more antibiotics after he assess him on Friday. If more antibiotic is needed can do Bactrim DS 1 BID  and augmentin 875 mg PO BID after completion of Linezolid  Dm on metformin- lactate N( as he is on linezolid)? ? Gout on allopurinol Recent flare up- took prednisone ___________________________________________________ Discussed with patient

## 2020-02-29 ENCOUNTER — Other Ambulatory Visit: Payer: Self-pay

## 2020-02-29 ENCOUNTER — Encounter: Payer: Self-pay | Admitting: *Deleted

## 2020-02-29 ENCOUNTER — Inpatient Hospital Stay
Admission: EM | Admit: 2020-02-29 | Discharge: 2020-03-04 | DRG: 250 | Disposition: A | Discharge status: Home / Self Care | Payer: Commercial Managed Care - PPO | Attending: Internal Medicine | Admitting: Internal Medicine

## 2020-02-29 ENCOUNTER — Emergency Department: Payer: Commercial Managed Care - PPO

## 2020-02-29 DIAGNOSIS — Z20822 Contact with and (suspected) exposure to covid-19: Secondary | ICD-10-CM | POA: Diagnosis present

## 2020-02-29 DIAGNOSIS — Z7984 Long term (current) use of oral hypoglycemic drugs: Secondary | ICD-10-CM

## 2020-02-29 DIAGNOSIS — I5040 Unspecified combined systolic (congestive) and diastolic (congestive) heart failure: Secondary | ICD-10-CM

## 2020-02-29 DIAGNOSIS — I255 Ischemic cardiomyopathy: Secondary | ICD-10-CM

## 2020-02-29 DIAGNOSIS — Z7952 Long term (current) use of systemic steroids: Secondary | ICD-10-CM | POA: Diagnosis not present

## 2020-02-29 DIAGNOSIS — E876 Hypokalemia: Secondary | ICD-10-CM | POA: Diagnosis not present

## 2020-02-29 DIAGNOSIS — I214 Non-ST elevation (NSTEMI) myocardial infarction: Secondary | ICD-10-CM | POA: Diagnosis present

## 2020-02-29 DIAGNOSIS — M109 Gout, unspecified: Secondary | ICD-10-CM | POA: Diagnosis present

## 2020-02-29 DIAGNOSIS — N400 Enlarged prostate without lower urinary tract symptoms: Secondary | ICD-10-CM | POA: Diagnosis present

## 2020-02-29 DIAGNOSIS — Z79899 Other long term (current) drug therapy: Secondary | ICD-10-CM

## 2020-02-29 DIAGNOSIS — T783XXA Angioneurotic edema, initial encounter: Secondary | ICD-10-CM | POA: Diagnosis present

## 2020-02-29 DIAGNOSIS — G4730 Sleep apnea, unspecified: Secondary | ICD-10-CM | POA: Diagnosis present

## 2020-02-29 DIAGNOSIS — K5900 Constipation, unspecified: Secondary | ICD-10-CM | POA: Diagnosis present

## 2020-02-29 DIAGNOSIS — R197 Diarrhea, unspecified: Secondary | ICD-10-CM | POA: Diagnosis not present

## 2020-02-29 DIAGNOSIS — I219 Acute myocardial infarction, unspecified: Secondary | ICD-10-CM | POA: Diagnosis not present

## 2020-02-29 DIAGNOSIS — I10 Essential (primary) hypertension: Secondary | ICD-10-CM | POA: Diagnosis not present

## 2020-02-29 DIAGNOSIS — I472 Ventricular tachycardia: Secondary | ICD-10-CM | POA: Diagnosis not present

## 2020-02-29 DIAGNOSIS — T462X5A Adverse effect of other antidysrhythmic drugs, initial encounter: Secondary | ICD-10-CM | POA: Diagnosis present

## 2020-02-29 DIAGNOSIS — I4891 Unspecified atrial fibrillation: Secondary | ICD-10-CM

## 2020-02-29 DIAGNOSIS — E119 Type 2 diabetes mellitus without complications: Secondary | ICD-10-CM

## 2020-02-29 DIAGNOSIS — I2119 ST elevation (STEMI) myocardial infarction involving other coronary artery of inferior wall: Secondary | ICD-10-CM | POA: Diagnosis present

## 2020-02-29 DIAGNOSIS — E785 Hyperlipidemia, unspecified: Secondary | ICD-10-CM | POA: Diagnosis present

## 2020-02-29 DIAGNOSIS — I110 Hypertensive heart disease with heart failure: Secondary | ICD-10-CM | POA: Diagnosis present

## 2020-02-29 DIAGNOSIS — Z888 Allergy status to other drugs, medicaments and biological substances status: Secondary | ICD-10-CM | POA: Diagnosis not present

## 2020-02-29 DIAGNOSIS — M1A00X0 Idiopathic chronic gout, unspecified site, without tophus (tophi): Secondary | ICD-10-CM | POA: Diagnosis not present

## 2020-02-29 DIAGNOSIS — I5021 Acute systolic (congestive) heart failure: Secondary | ICD-10-CM | POA: Diagnosis not present

## 2020-02-29 DIAGNOSIS — I2510 Atherosclerotic heart disease of native coronary artery without angina pectoris: Secondary | ICD-10-CM | POA: Diagnosis present

## 2020-02-29 DIAGNOSIS — Y929 Unspecified place or not applicable: Secondary | ICD-10-CM

## 2020-02-29 DIAGNOSIS — I252 Old myocardial infarction: Secondary | ICD-10-CM | POA: Diagnosis not present

## 2020-02-29 DIAGNOSIS — Z833 Family history of diabetes mellitus: Secondary | ICD-10-CM | POA: Diagnosis not present

## 2020-02-29 DIAGNOSIS — I480 Paroxysmal atrial fibrillation: Secondary | ICD-10-CM | POA: Diagnosis present

## 2020-02-29 DIAGNOSIS — I5023 Acute on chronic systolic (congestive) heart failure: Secondary | ICD-10-CM | POA: Diagnosis present

## 2020-02-29 DIAGNOSIS — I2111 ST elevation (STEMI) myocardial infarction involving right coronary artery: Secondary | ICD-10-CM | POA: Diagnosis not present

## 2020-02-29 DIAGNOSIS — E1169 Type 2 diabetes mellitus with other specified complication: Secondary | ICD-10-CM | POA: Diagnosis not present

## 2020-02-29 DIAGNOSIS — Z8249 Family history of ischemic heart disease and other diseases of the circulatory system: Secondary | ICD-10-CM

## 2020-02-29 HISTORY — DX: Hyperlipidemia, unspecified: E78.5

## 2020-02-29 HISTORY — DX: Unspecified atrial fibrillation: I48.91

## 2020-02-29 HISTORY — DX: Ischemic cardiomyopathy: I25.5

## 2020-02-29 HISTORY — DX: Unspecified combined systolic (congestive) and diastolic (congestive) heart failure: I50.40

## 2020-02-29 HISTORY — DX: Atherosclerotic heart disease of native coronary artery without angina pectoris: I25.10

## 2020-02-29 LAB — BASIC METABOLIC PANEL
Anion gap: 9 (ref 5–15)
BUN: 14 mg/dL (ref 6–20)
CO2: 26 mmol/L (ref 22–32)
Calcium: 8.6 mg/dL — ABNORMAL LOW (ref 8.9–10.3)
Chloride: 99 mmol/L (ref 98–111)
Creatinine, Ser: 1.07 mg/dL (ref 0.61–1.24)
GFR calc Af Amer: >60 mL/min (ref >60)
GFR calc non Af Amer: >60 mL/min (ref >60)
Glucose, Bld: 166 mg/dL — ABNORMAL HIGH (ref 70–99)
Potassium: 3.4 mmol/L — ABNORMAL LOW (ref 3.5–5.1)
Sodium: 134 mmol/L — ABNORMAL LOW (ref 135–145)

## 2020-02-29 LAB — CBC
HCT: 40.1 % (ref 39.0–52.0)
Hemoglobin: 13.7 g/dL (ref 13.0–17.0)
MCH: 28.8 pg (ref 26.0–34.0)
MCHC: 34.2 g/dL (ref 30.0–36.0)
MCV: 84.4 fL (ref 80.0–100.0)
Platelets: 153 10*3/uL (ref 150–400)
RBC: 4.75 MIL/uL (ref 4.22–5.81)
RDW: 13.4 % (ref 11.5–15.5)
WBC: 8.7 10*3/uL (ref 4.0–10.5)
nRBC: 0 % (ref 0.0–0.2)

## 2020-02-29 LAB — PROTIME-INR
INR: 1.1 (ref 0.8–1.2)
Prothrombin Time: 13.9 seconds (ref 11.4–15.2)

## 2020-02-29 LAB — TROPONIN I (HIGH SENSITIVITY): Troponin I (High Sensitivity): 20077 ng/L (ref <18)

## 2020-02-29 MED ORDER — ALLOPURINOL 100 MG PO TABS
100.0000 mg | ORAL_TABLET | Freq: Every day | ORAL | Status: DC
  Administered 2020-03-01 – 2020-03-04 (×4): 100 mg via ORAL
  Filled 2020-02-29 (×4): qty 1

## 2020-02-29 MED ORDER — ASPIRIN 81 MG PO CHEW
324.0000 mg | CHEWABLE_TABLET | Freq: Once | ORAL | Status: AC
  Administered 2020-02-29: 324 mg via ORAL

## 2020-02-29 MED ORDER — MORPHINE SULFATE (PF) 4 MG/ML IV SOLN: 4.0000 mg | Freq: Once | INTRAVENOUS | Status: AC

## 2020-02-29 MED ORDER — ONDANSETRON HCL 4 MG/2ML IJ SOLN: 4.0000 mg | Freq: Once | INTRAMUSCULAR | Status: AC

## 2020-02-29 MED ORDER — MORPHINE SULFATE (PF) 4 MG/ML IV SOLN
INTRAVENOUS | Status: AC
  Administered 2020-02-29: 4 mg via INTRAVENOUS
  Filled 2020-02-29: qty 1

## 2020-02-29 MED ORDER — ONDANSETRON HCL 4 MG/2ML IJ SOLN
INTRAMUSCULAR | Status: AC
  Administered 2020-02-29: 4 mg via INTRAVENOUS
  Filled 2020-02-29: qty 2

## 2020-02-29 MED ORDER — HEPARIN SODIUM (PORCINE) 5000 UNIT/ML IJ SOLN
4000.0000 [IU] | Freq: Once | INTRAMUSCULAR | Status: AC
  Administered 2020-02-29: 4000 [IU] via INTRAVENOUS
  Filled 2020-02-29: qty 1

## 2020-02-29 MED ORDER — HEPARIN SODIUM (PORCINE) 5000 UNIT/ML IJ SOLN: 5000.0000 [IU] | Freq: Once | INTRAMUSCULAR | Status: DC

## 2020-02-29 MED ORDER — TAMSULOSIN HCL 0.4 MG PO CAPS
0.4000 mg | ORAL_CAPSULE | Freq: Every day | ORAL | Status: DC
  Administered 2020-03-01 – 2020-03-04 (×4): 0.4 mg via ORAL
  Filled 2020-02-29 (×4): qty 1

## 2020-02-29 MED ORDER — HEPARIN (PORCINE) 25000 UT/250ML-% IV SOLN
1450.0000 [IU]/h | INTRAVENOUS | Status: DC
  Administered 2020-02-29: 1450 [IU]/h via INTRAVENOUS
  Filled 2020-02-29: qty 250

## 2020-02-29 NOTE — Progress Notes (Signed)
Chest pain started more than 24 hours ago. EKG with inferior Q waves and minor 1 mm inferior ST elevation . First TnI is > 20,000. This is consistent with late presenting inferior STEMI. He has minimal symptoms at this time. Recommend medical therapy with Aspirin , Heparin drip, statin and beta blocker if tolerated . Plan cath tomorrow AM or earlier if he becomes unstable .

## 2020-02-29 NOTE — H&P (Signed)
[place] at [facility]   PATIENT NAME: Joshua Holder    MR#:  [id number]  DATE OF BIRTH:  [birth year]  DATE OF ADMISSION:  02/29/2020  PRIMARY CARE PHYSICIAN: System, Provider Not In   REQUESTING/REFERRING PHYSICIAN: Dionne Bucy, MD  CHIEF COMPLAINT:   Chief Complaint  Patient presents with  . Chest Pain    HISTORY OF PRESENT ILLNESS:  Joshua Holder  is a 54 y.o. male with a known history of type diabetes mellitus, gout and hypertension, who presented to the emergency room with acute onset of recurrent chest pain which started yesterday and was graded 6/10 in severity and felt as tightness across the chest wall more on the right than the left with associated dyspnea and palpitations as well as diaphoresis without nausea or vomiting.  He stated that it got significantly worse today to 8/10 in severity.  He denied any recent leg pain or edema or travels or surgeries.  No cough or wheezing or hemoptysis.  His pain radiated to his right shoulder today.  Upon presentation to the emergency room, respiratory rate was 24, blood pressure was 126/95 with a heart rate of 81 with otherwise normal vital signs.  Labs revealed potassium of 3.4 and high-sensitivity troponin I of 20,077 with unremarkable CBC.  INR is 1.1 with PT 13.9.Marland Kitchen  Chest x-ray showed no acute cardiopulmonary disease.EKG showed normal sinus rhythm with rate of 81 with minimal voltage criteria for LVH, T wave inversion laterally and inferiorly with ST segment depression in lead I and aVL and residual 1 mm ST segment elevation inferiorly in lead III and aVF. Repeat EKG showed normal sinus rhythm with rate of 80 with similar findings.  The patient was given 40 aspirin and ordered IV heparin bolus and drip as well as 4 mg of IV morphine sulfate and 4 mg of IV Zofran.  His chest pain recurred during the interview and he was diaphoretic.  Dr. Kirke Corin was initially contacted about the patient and was going to manage  conservatively overnight but given recurrence of the patient chest pain he will taken to the Cath Lab tonight.  The patient will then be admitted to the ICU.  PAST MEDICAL HISTORY:   Past Medical History:  Diagnosis Date  . 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 Gout   . Hypertension     PAST SURGICAL HISTORY:   Past Surgical History:  Procedure Laterality Date  . INCISION AND DRAINAGE Left 12/30/2018   Procedure: INCISION AND DRAINAGE LEFT ELBOW;  Surgeon: Juanell Fairly, MD;  Location: [facility] ORS;  Service: Orthopedics;  Laterality: Left;    SOCIAL HISTORY:   Social History   Tobacco Use  . Smoking status: Never Smoker  . Smokeless tobacco: Never Used  Substance Use Topics  . Alcohol use: Yes    Comment: social    FAMILY HISTORY:  History reviewed. No pertinent family history.  DRUG ALLERGIES:   Allergies  Allergen Reactions  . Lisinopril Swelling and Cough    REVIEW OF SYSTEMS:   ROS As per history of present illness. All pertinent systems were reviewed above. Constitutional,  HEENT, cardiovascular, respiratory, GI, GU, musculoskeletal, neuro, psychiatric, endocrine,  integumentary and hematologic systems were reviewed and are otherwise  negative/unremarkable except for positive findings mentioned above in the HPI.   MEDICATIONS AT HOME:   Prior to Admission medications   Medication Sig Start Date End Date Taking? Authorizing Provider  Tadalafil 2.5 MG TABS Take 1  tablet by mouth daily. 02/19/20  Yes [provider]  tamsulosin (FLOMAX) 0.4 MG CAPS capsule Take 0.4 mg by mouth daily. 11/07/19  Yes [provider]  allopurinol (ZYLOPRIM) 100 MG tablet Take 100 mg by mouth daily. 05/26/15   [provider]  metFORMIN (GLUCOPHAGE-XR) 500 MG 24 hr tablet Take 1,000 mg by mouth daily with breakfast.    [provider]  predniSONE (DELTASONE) 20 MG tablet prednisone 20 mg tablet Patient not taking: Reported on 02/29/2020     [provider]      VITAL SIGNS:  Blood pressure (!) 126/95, pulse 80, temperature 98.3 F (36.8 C), temperature source Oral, resp. rate 15, height 5\' 11"  (1.803 m), weight 127 kg, SpO2 100 %.  PHYSICAL EXAMINATION:  Physical Exam  GENERAL:  54 y.o.-year-old ill-looking obese Caucasian male patient lying in the bed with mild distress from chest pain with mild diaphoresis. EYES: Pupils equal, round, reactive to light and accommodation. No scleral icterus. Extraocular muscles intact.  HEENT: Head atraumatic, normocephalic. Oropharynx and nasopharynx clear.  NECK:  Supple, no jugular venous distention. No thyroid enlargement, no tenderness.  LUNGS: Normal breath sounds bilaterally, no wheezing, rales,rhonchi or crepitation. No use of accessory muscles of respiration.  CARDIOVASCULAR: Regular rate and rhythm, S1, S2 normal. No murmurs, rubs, or gallops.  ABDOMEN: Soft, nondistended, nontender. Bowel sounds present. No organomegaly or mass.  EXTREMITIES: No pedal edema, cyanosis, or clubbing.  NEUROLOGIC: Cranial nerves II through XII are intact. Muscle strength 5/5 in all extremities. Sensation intact. Gait not checked.  PSYCHIATRIC: The patient is alert and oriented x 3.  Normal affect and good eye contact. SKIN: No obvious rash, lesion, or ulcer.   LABORATORY PANEL:   CBC Recent Labs  Lab 02/29/20 2215  WBC 8.7  HGB 13.7  HCT 40.1  PLT 153   ------------------------------------------------------------------------------------------------------------------  Chemistries  Recent Labs  Lab 02/29/20 2215  NA 134*  K 3.4*  CL 99  CO2 26  GLUCOSE 166*  BUN 14  CREATININE 1.07  CALCIUM 8.6*   ------------------------------------------------------------------------------------------------------------------  Cardiac Enzymes No results for input(s): TROPONINI in the last 168  hours. ------------------------------------------------------------------------------------------------------------------  RADIOLOGY:  DG Chest 2 View  Result Date: 02/29/2020 CLINICAL DATA:  Worsening fatigue with intermittent cough. EXAM: CHEST - 2 VIEW COMPARISON:  None. FINDINGS: There is no evidence of acute infiltrate, pleural effusion or pneumothorax. The heart size and mediastinal contours are within normal limits. The visualized skeletal structures are unremarkable. IMPRESSION: No active cardiopulmonary disease. Electronically Signed   By: Virgina Norfolk M.D.   On: 02/29/2020 22:30      IMPRESSION AND PLAN:   1.  Recent acute myocardial infarction with recurrent chest pain. -The patient will be admitted to an ICU bed. -He will be taken to the Cath Lab prior to that. -We will place on as needed IV morphine sulfate for now, continue IV heparin and given high-dose statin therapy as well as 1 dose of IV Lopressor. -Dr. Fletcher Anon was notified about the patient will take him as mentioned above to the Cath Lab.  2.  Type 2 diabetes mellitus. -The patient will be placed on supplement coverage with NovoLog. -Metformin will be held off.  3.  Hypertension. -His ARB and diuretic therapy will be continued.   4.  Gout. -Allopurinol and colchicine will be continued.  5.  DVT prophylaxis. -The patient will be for now on IV heparin.     All the records are reviewed and case discussed with ED  provider. The plan of care was discussed in details with the patient (and family). I answered all questions. The patient agreed to proceed with the above mentioned plan. Further management will depend upon hospital course.   CODE STATUS: Full code  Status is: Inpatient  Remains inpatient appropriate because:Ongoing active pain requiring inpatient pain management, Ongoing diagnostic testing needed not appropriate for outpatient work up, Unsafe d/c plan, IV treatments appropriate due to intensity  of illness or inability to take PO and Inpatient level of care appropriate due to severity of illness   Dispo: The patient is from: Home              Anticipated d/c is to: Home              Anticipated d/c date is: 2 days              Patient currently is not medically stable to d/c.   TOTAL TIME TAKING CARE OF THIS PATIENT: 55 minutes.    Hannah Beat M.D on 02/29/2020 at 11:56 PM  [facility]   From 7 PM-7 AM, contact night-coverage [email or web address]  CC: Primary care physician; System, Provider Not In   Note: This dictation was prepared with Dragon dictation along with smaller phrase technology. Any transcriptional typo errors that result from this process are unintentional.

## 2020-02-29 NOTE — ED Provider Notes (Signed)
[facility] Emergency Department Provider Note ____________________________________________   First MD Initiated Contact with Patient 02/29/20 2243     (approximate)  I have reviewed the triage vital signs and the nursing notes.   HISTORY  Chief Complaint Chest Pain    HPI Joshua Holder is a 54 y.o. male with history of hypertension diabetes who presents with chest pain, gradual onset yesterday, described as tightness across his chest, and associated with some shortness of breath.  He denies any nausea or lightheadedness.  He has had no prior history of this pain.  Past Medical History:  Diagnosis Date   Diabetes 1.5, managed as type 2 (HCC)    Gout    Hypertension     Patient Active Problem List   Diagnosis Date Noted   Olecranon bursitis of left elbow 01/13/2019   DM (diabetes mellitus) (HCC) 01/13/2019   HTN (hypertension) 01/13/2019   Gout 01/13/2019   Cellulitis 12/29/2018    Past Surgical History:  Procedure Laterality Date   INCISION AND DRAINAGE Left 12/30/2018   Procedure: INCISION AND DRAINAGE LEFT ELBOW;  Surgeon: Juanell Fairly, MD;  Location: [facility] ORS;  Service: Orthopedics;  Laterality: Left;    Prior to Admission medications   Medication Sig Start Date End Date Taking? Authorizing Provider  allopurinol (ZYLOPRIM) 100 MG tablet Take 100 mg by mouth daily. 05/26/15   [provider]  metFORMIN (GLUCOPHAGE-XR) 500 MG 24 hr tablet Take 1,000 mg by mouth daily with breakfast.    [provider]  predniSONE (DELTASONE) 20 MG tablet prednisone 20 mg tablet    [provider]    Allergies Lisinopril  History reviewed. No pertinent family history.  Social History Social History   Tobacco Use   Smoking status: Never Smoker   Smokeless tobacco: Never Used  Substance Use Topics   Alcohol use: Yes    Comment: social   Drug use: Not Currently    Review of Systems  Constitutional: No  fever/chills. Eyes: No visual changes. ENT: No sore throat. Cardiovascular: Positive for chest pain. Respiratory: Positive for shortness of breath. Gastrointestinal: No vomiting or diarrhea.  Genitourinary: Negative for flank pain. Musculoskeletal: Negative for back pain. Skin: Negative for rash. Neurological: Negative for headache.   ____________________________________________   PHYSICAL EXAM:  VITAL SIGNS: ED Triage Vitals  Enc Vitals Group     BP 02/29/20 2202 118/89     Pulse Rate 02/29/20 2202 82     Resp 02/29/20 2202 17     Temp 02/29/20 2202 98.3 F (36.8 C)     Temp Source 02/29/20 2202 Oral     SpO2 02/29/20 2202 98 %     Weight 02/29/20 2203 280 lb (127 kg)     Height 02/29/20 2203 5\' 11"  (1.803 m)     Head Circumference --      Peak Flow --      Pain Score 02/29/20 2214 6     Pain Loc --      Pain Edu? --      Excl. in GC? --     Constitutional: Alert and oriented. Well appearing and in no acute distress. Eyes: Conjunctivae are normal.  Head: Atraumatic. Nose: No congestion/rhinnorhea. Mouth/Throat: Mucous membranes are moist.   Neck: Normal range of motion.  Cardiovascular: Normal rate, regular rhythm. Grossly normal heart sounds.  Good peripheral circulation. Respiratory: Normal respiratory effort.  No retractions. Lungs CTAB. Gastrointestinal:  No distention.  Musculoskeletal: No lower extremity edema.  Extremities warm  and well perfused.  Neurologic:  Normal speech and language. No gross focal neurologic deficits are appreciated.  Skin:  Skin is warm and dry. No rash noted. Psychiatric: Mood and affect are normal. Speech and behavior are normal.  ____________________________________________   LABS (all labs ordered are listed, but only abnormal results are displayed)  Labs Reviewed  BASIC METABOLIC PANEL - Abnormal; Notable for the following components:      Result Value   Sodium 134 (*)    Potassium 3.4 (*)    Glucose, Bld 166 (*)     Calcium 8.6 (*)    All other components within normal limits  TROPONIN I (HIGH SENSITIVITY) - Abnormal; Notable for the following components:   Troponin I (High Sensitivity) 20,077 (*)    All other components within normal limits  SARS CORONAVIRUS 2 BY RT PCR (HOSPITAL ORDER, [place] [facility])  CBC  PROTIME-INR   ____________________________________________  EKG  ED ECG REPORT I, Arta Silence, the attending physician, personally viewed and interpreted this ECG.  Date: 02/29/2020 EKG Time: 2202 Rate: 78 Rhythm: normal sinus rhythm QRS Axis: normal Intervals: normal ST/T Wave abnormalities: Nonspecific possible minimal ST elevation in inferior leads Narrative Interpretation: Nonspecific inferior ST abnormalities with no old EKG available for comparison   ED ECG REPORT I, Arta Silence, the attending physician, personally viewed and interpreted this ECG.  Date: 02/29/2020 EKG Time: 2238 Rate: 81 Rhythm: normal sinus rhythm QRS Axis: normal Intervals: normal ST/T Wave abnormalities: Minimal ST elevation in inferior leads Narrative Interpretation: No dynamic change when compared to EKG of 2202 today  ____________________________________________  RADIOLOGY    ____________________________________________   PROCEDURES  Procedure(s) performed: No  Procedures  Critical Care performed: Yes  CRITICAL CARE Performed by: Arta Silence   Total critical care time: 30 minutes  Critical care time was exclusive of separately billable procedures and treating other patients.  Critical care was necessary to treat or prevent imminent or life-threatening deterioration.  Critical care was time spent personally by me on the following activities: development of treatment plan with patient and/or surrogate as well as nursing, discussions with consultants, evaluation of patient's response to treatment, examination of patient, obtaining history  from patient or surrogate, ordering and performing treatments and interventions, ordering and review of laboratory studies, ordering and review of radiographic studies, pulse oximetry and re-evaluation of patient's condition. ____________________________________________   INITIAL IMPRESSION / ASSESSMENT AND PLAN / ED COURSE  Pertinent labs & imaging results that were available during my care of the patient were reviewed by me and considered in my medical decision making (see chart for details).  54 year old male with a history of diabetes and hypertension presents with chest pain since yesterday.  He has some associated shortness of breath but no nausea or lightheadedness.  He denies any previous cardiac history.  I reviewed the past medical records in [place].  The patient was most recently admitted in April of last year for olecranon bursitis and cellulitis.  He has no prior documented cardiac evaluation at the no prior EKGs available.  On exam he is overall well-appearing.  His vital signs are normal.  The physical exam is unremarkable.  I was handed and initial EKG from triage done at 2202.  It showed questionable diffuse ST elevation inferiorly, approximately 1 mm, not meeting STEMI criteria.  I then had a repeat EKG done shortly after 2230 which showed similar findings, and no dynamic changes.  He still does not meet STEMI activation criteria.  At that time I asked the patient to be brought back to a room.  After speaking with him I consulted Dr. Kirke Corin from cardiology who is on for STEMI tonight.  He is reviewing the EKG and will get back to me.  ----------------------------------------- 11:24 PM on 02/29/2020 -----------------------------------------  Dr. Kirke Corin advises that the patient does not meet STEMI criteria.  While I was talking to him, the troponin came back at over 20,000.  The patient continues to be clinically stable with normal vital signs, and he appears well.  Per Dr. Kirke Corin,  the patient's presentation is consistent with an MI that began yesterday.  He recommends that we put the patient on a heparin infusion and he will plan for cardiac catheterization tomorrow morning.  I discussed the results of work-up and the plan of care with the patient.  ----------------------------------------- 11:44 PM on 02/29/2020 -----------------------------------------  I discussed the case with the hospitalist Dr. Arville Care for admission.   ____________________________________________   FINAL CLINICAL IMPRESSION(S) / ED DIAGNOSES  Final diagnoses:  NSTEMI (non-ST elevated myocardial infarction) (HCC)      NEW MEDICATIONS STARTED DURING THIS VISIT:  New Prescriptions   No medications on file     Note:  This document was prepared using Dragon voice recognition software and may include unintentional dictation errors.    Dionne Bucy, MD 02/29/20 [phone]

## 2020-02-29 NOTE — ED Triage Notes (Signed)
Pt to ED reporting increased fatigue since yesterday with chest pain. Intermittent cough. No dizziness or lightheadedness. No fevers. Pain is not radiating.

## 2020-02-29 NOTE — Progress Notes (Signed)
ANTICOAGULATION CONSULT NOTE - Initial Consult  Pharmacy Consult for Heparin  Indication: chest pain/ACS  Allergies  Allergen Reactions  . Lisinopril Swelling and Cough    Patient Measurements: Height: 5\' 11"  (180.3 cm) Weight: 127 kg (280 lb) IBW/kg (Calculated) : 75.3 Heparin Dosing Weight: 104 kg   Vital Signs: Temp: 98.3 F (36.8 C) (06/21 2202) Temp Source: Oral (06/21 2202) BP: 121/92 (06/21 2251) Pulse Rate: 80 (06/21 2251)  Labs: Recent Labs    02/29/20 2215 02/29/20 2251  HGB 13.7  --   HCT 40.1  --   PLT 153  --   LABPROT  --  13.9  INR  --  1.1  CREATININE 1.07  --   TROPONINIHS 20,077*  --     Estimated Creatinine Clearance: 109.7 mL/min (by C-G formula based on SCr of 1.07 mg/dL).   Medical History: Past Medical History:  Diagnosis Date  . 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 Gout   . Hypertension     Medications:  (Not in a hospital admission)   Assessment: Pharmacy consulted to dose heparin in this 54 year old male with NSTEMI.  No prior anticoag noted.  CrCl = 109.7 ml/min  Goal of Therapy:  Heparin level 0.3-0.7 units/ml Monitor platelets by anticoagulation protocol: Yes   Plan:  Will order Heparin 4000 units IV X 1 bolus and start drip rate at 1450 units/hr.  Will check HL 6 hrs after start of drip. Will monitor HL and CBC daily.   Shalina Norfolk D 02/29/2020,11:20 PM

## 2020-02-29 NOTE — ED Notes (Signed)
Wife contact Omare Bilotta [phone]

## 2020-03-01 ENCOUNTER — Other Ambulatory Visit: Payer: Self-pay

## 2020-03-01 ENCOUNTER — Encounter
Admission: EM | Disposition: A | Discharge status: Home / Self Care | Payer: Self-pay | Source: Home / Self Care | Attending: Internal Medicine

## 2020-03-01 ENCOUNTER — Encounter: Payer: Self-pay | Admitting: Cardiovascular Disease

## 2020-03-01 ENCOUNTER — Inpatient Hospital Stay (HOSPITAL_COMMUNITY)
Admit: 2020-03-01 | Discharge: 2020-03-01 | Disposition: A | Discharge status: Home / Self Care | Payer: Commercial Managed Care - PPO | Attending: Cardiovascular Disease | Admitting: Cardiovascular Disease

## 2020-03-01 DIAGNOSIS — E1169 Type 2 diabetes mellitus with other specified complication: Secondary | ICD-10-CM

## 2020-03-01 DIAGNOSIS — I2510 Atherosclerotic heart disease of native coronary artery without angina pectoris: Secondary | ICD-10-CM

## 2020-03-01 DIAGNOSIS — I2119 ST elevation (STEMI) myocardial infarction involving other coronary artery of inferior wall: Secondary | ICD-10-CM

## 2020-03-01 DIAGNOSIS — I219 Acute myocardial infarction, unspecified: Secondary | ICD-10-CM

## 2020-03-01 DIAGNOSIS — I5021 Acute systolic (congestive) heart failure: Secondary | ICD-10-CM

## 2020-03-01 DIAGNOSIS — E785 Hyperlipidemia, unspecified: Secondary | ICD-10-CM

## 2020-03-01 HISTORY — PX: CORONARY/GRAFT ACUTE MI REVASCULARIZATION: CATH118305

## 2020-03-01 HISTORY — PX: LEFT HEART CATH AND CORONARY ANGIOGRAPHY: CATH118249

## 2020-03-01 LAB — GLUCOSE, CAPILLARY
Glucose-Capillary: 127 mg/dL — ABNORMAL HIGH (ref 70–99)
Glucose-Capillary: 132 mg/dL — ABNORMAL HIGH (ref 70–99)

## 2020-03-01 LAB — ECHOCARDIOGRAM COMPLETE
Height: 71 in
Weight: 4254 oz

## 2020-03-01 LAB — CBC
HCT: 40.8 % (ref 39.0–52.0)
Hemoglobin: 13.5 g/dL (ref 13.0–17.0)
MCH: 28.5 pg (ref 26.0–34.0)
MCHC: 33.1 g/dL (ref 30.0–36.0)
MCV: 86.3 fL (ref 80.0–100.0)
Platelets: 162 10*3/uL (ref 150–400)
RBC: 4.73 MIL/uL (ref 4.22–5.81)
RDW: 13.8 % (ref 11.5–15.5)
WBC: 8.2 10*3/uL (ref 4.0–10.5)
nRBC: 0 % (ref 0.0–0.2)

## 2020-03-01 LAB — BASIC METABOLIC PANEL
Anion gap: 12 (ref 5–15)
BUN: 13 mg/dL (ref 6–20)
CO2: 21 mmol/L — ABNORMAL LOW (ref 22–32)
Calcium: 8.3 mg/dL — ABNORMAL LOW (ref 8.9–10.3)
Chloride: 103 mmol/L (ref 98–111)
Creatinine, Ser: 0.94 mg/dL (ref 0.61–1.24)
GFR calc Af Amer: >60 mL/min (ref >60)
GFR calc non Af Amer: >60 mL/min (ref >60)
Glucose, Bld: 127 mg/dL — ABNORMAL HIGH (ref 70–99)
Potassium: 3.6 mmol/L (ref 3.5–5.1)
Sodium: 136 mmol/L (ref 135–145)

## 2020-03-01 LAB — LIPID PANEL
Cholesterol: 208 mg/dL — ABNORMAL HIGH (ref 0–200)
HDL: 49 mg/dL (ref >40)
LDL Cholesterol: 133 mg/dL — ABNORMAL HIGH (ref 0–99)
Total CHOL/HDL Ratio: 4.2 RATIO
Triglycerides: 128 mg/dL (ref <150)
VLDL: 26 mg/dL (ref 0–40)

## 2020-03-01 LAB — SARS CORONAVIRUS 2 BY RT PCR (HOSPITAL ORDER, PERFORMED IN ~~LOC~~ HOSPITAL LAB): SARS Coronavirus 2: NEGATIVE

## 2020-03-01 LAB — MRSA PCR SCREENING: MRSA by PCR: POSITIVE — AB

## 2020-03-01 LAB — TROPONIN I (HIGH SENSITIVITY)
Troponin I (High Sensitivity): 17858 ng/L (ref <18)
Troponin I (High Sensitivity): 19065 ng/L (ref <18)
Troponin I (High Sensitivity): 21388 ng/L (ref <18)

## 2020-03-01 LAB — POCT ACTIVATED CLOTTING TIME: Activated Clotting Time: 213 seconds

## 2020-03-01 SURGERY — CORONARY/GRAFT ACUTE MI REVASCULARIZATION
Anesthesia: Moderate Sedation

## 2020-03-01 MED ORDER — TIROFIBAN HCL IV 12.5 MG/250 ML
INTRAVENOUS | Status: AC
  Filled 2020-03-01: qty 250

## 2020-03-01 MED ORDER — IOHEXOL 300 MG/ML  SOLN
INTRAMUSCULAR | Status: DC | PRN
  Administered 2020-03-01: 125 mL

## 2020-03-01 MED ORDER — SODIUM CHLORIDE 0.9 % IV SOLN: INTRAVENOUS | Status: AC

## 2020-03-01 MED ORDER — HEPARIN (PORCINE) IN NACL 1000-0.9 UT/500ML-% IV SOLN
INTRAVENOUS | Status: AC
  Filled 2020-03-01: qty 1000

## 2020-03-01 MED ORDER — ASPIRIN 81 MG PO CHEW
81.0000 mg | CHEWABLE_TABLET | Freq: Every day | ORAL | Status: DC
  Administered 2020-03-01 – 2020-03-04 (×4): 81 mg via ORAL
  Filled 2020-03-01 (×4): qty 1

## 2020-03-01 MED ORDER — VERAPAMIL HCL 2.5 MG/ML IV SOLN
INTRAVENOUS | Status: DC | PRN
  Administered 2020-03-01: 2.5 mg via INTRA_ARTERIAL

## 2020-03-01 MED ORDER — VERAPAMIL HCL 2.5 MG/ML IV SOLN
INTRAVENOUS | Status: AC
  Filled 2020-03-01: qty 2

## 2020-03-01 MED ORDER — FENTANYL CITRATE (PF) 100 MCG/2ML IJ SOLN
INTRAMUSCULAR | Status: AC
  Filled 2020-03-01: qty 2

## 2020-03-01 MED ORDER — MIDAZOLAM HCL 2 MG/2ML IJ SOLN
INTRAMUSCULAR | Status: DC | PRN
  Administered 2020-03-01: 1 mg via INTRAVENOUS

## 2020-03-01 MED ORDER — MIDAZOLAM HCL 2 MG/2ML IJ SOLN
INTRAMUSCULAR | Status: AC
  Filled 2020-03-01: qty 2

## 2020-03-01 MED ORDER — MORPHINE SULFATE (PF) 2 MG/ML IV SOLN
2.0000 mg | INTRAVENOUS | Status: DC | PRN
  Administered 2020-03-01 – 2020-03-02 (×8): 2 mg via INTRAVENOUS
  Filled 2020-03-01 (×8): qty 1

## 2020-03-01 MED ORDER — HEPARIN SODIUM (PORCINE) 1000 UNIT/ML IJ SOLN
INTRAMUSCULAR | Status: AC
  Filled 2020-03-01: qty 1

## 2020-03-01 MED ORDER — PERFLUTREN LIPID MICROSPHERE
1.0000 mL | INTRAVENOUS | Status: AC | PRN
  Administered 2020-03-01: 2 mL via INTRAVENOUS
  Filled 2020-03-01: qty 10

## 2020-03-01 MED ORDER — SODIUM CHLORIDE 0.9% FLUSH
3.0000 mL | Freq: Two times a day (BID) | INTRAVENOUS | Status: DC
  Administered 2020-03-01 – 2020-03-04 (×5): 3 mL via INTRAVENOUS

## 2020-03-01 MED ORDER — FENTANYL CITRATE (PF) 100 MCG/2ML IJ SOLN
INTRAMUSCULAR | Status: DC | PRN
  Administered 2020-03-01: 25 ug via INTRAVENOUS

## 2020-03-01 MED ORDER — SODIUM CHLORIDE 0.9% FLUSH: 3.0000 mL | INTRAVENOUS | Status: DC | PRN

## 2020-03-01 MED ORDER — ENOXAPARIN SODIUM 40 MG/0.4ML ~~LOC~~ SOLN: 40.0000 mg | SUBCUTANEOUS | Status: DC

## 2020-03-01 MED ORDER — METOPROLOL TARTRATE 5 MG/5ML IV SOLN: 2.5000 mg | Freq: Once | INTRAVENOUS | Status: DC

## 2020-03-01 MED ORDER — MUPIROCIN 2 % EX OINT
1.0000 "application " | TOPICAL_OINTMENT | Freq: Two times a day (BID) | CUTANEOUS | Status: DC
  Administered 2020-03-01 – 2020-03-04 (×7): 1 via NASAL
  Filled 2020-03-01: qty 22

## 2020-03-01 MED ORDER — ATROPINE SULFATE 1 MG/10ML IJ SOSY
PREFILLED_SYRINGE | INTRAMUSCULAR | Status: AC
  Filled 2020-03-01: qty 10

## 2020-03-01 MED ORDER — TICAGRELOR 90 MG PO TABS
90.0000 mg | ORAL_TABLET | Freq: Two times a day (BID) | ORAL | Status: DC
  Administered 2020-03-02 – 2020-03-04 (×5): 90 mg via ORAL
  Filled 2020-03-01 (×5): qty 1

## 2020-03-01 MED ORDER — ATORVASTATIN CALCIUM 20 MG PO TABS
80.0000 mg | ORAL_TABLET | Freq: Every day | ORAL | Status: DC
  Administered 2020-03-01 – 2020-03-04 (×5): 80 mg via ORAL
  Filled 2020-03-01 (×5): qty 4

## 2020-03-01 MED ORDER — SODIUM CHLORIDE 0.9 % IV SOLN: 250.0000 mL | INTRAVENOUS | Status: DC | PRN

## 2020-03-01 MED ORDER — HEPARIN SODIUM (PORCINE) 1000 UNIT/ML IJ SOLN
INTRAMUSCULAR | Status: DC | PRN
  Administered 2020-03-01: 4000 [IU] via INTRAVENOUS
  Administered 2020-03-01: 5000 [IU] via INTRAVENOUS

## 2020-03-01 MED ORDER — TIROFIBAN HCL IV 12.5 MG/250 ML
INTRAVENOUS | Status: AC | PRN
  Administered 2020-03-01: 0.15 ug/kg/min via INTRAVENOUS

## 2020-03-01 MED ORDER — TIROFIBAN HCL IV 12.5 MG/250 ML
0.1500 ug/kg/min | INTRAVENOUS | Status: AC
  Administered 2020-03-01 (×2): 0.15 ug/kg/min via INTRAVENOUS
  Filled 2020-03-01 (×2): qty 250

## 2020-03-01 MED ORDER — COLCHICINE 0.6 MG PO TABS
0.6000 mg | ORAL_TABLET | Freq: Two times a day (BID) | ORAL | Status: DC
  Administered 2020-03-01 – 2020-03-04 (×7): 0.6 mg via ORAL
  Filled 2020-03-01 (×9): qty 1

## 2020-03-01 MED ORDER — LIDOCAINE HCL (PF) 1 % IJ SOLN
INTRAMUSCULAR | Status: AC
  Filled 2020-03-01: qty 30

## 2020-03-01 MED ORDER — LIDOCAINE HCL (PF) 1 % IJ SOLN
INTRAMUSCULAR | Status: DC | PRN
  Administered 2020-03-01: 30 mL

## 2020-03-01 MED ORDER — ONDANSETRON HCL 4 MG/2ML IJ SOLN: 4.0000 mg | Freq: Four times a day (QID) | INTRAMUSCULAR | Status: DC | PRN

## 2020-03-01 MED ORDER — TIROFIBAN (AGGRASTAT) BOLUS VIA INFUSION
INTRAVENOUS | Status: DC | PRN
  Administered 2020-03-01: 3175 ug via INTRAVENOUS

## 2020-03-01 MED ORDER — ACETAMINOPHEN 325 MG PO TABS
650.0000 mg | ORAL_TABLET | ORAL | Status: DC | PRN
  Administered 2020-03-01: 650 mg via ORAL
  Filled 2020-03-01: qty 2

## 2020-03-01 MED ORDER — CHLORHEXIDINE GLUCONATE CLOTH 2 % EX PADS
6.0000 | MEDICATED_PAD | Freq: Every day | CUTANEOUS | Status: DC
  Administered 2020-03-01 (×2): 6 via TOPICAL

## 2020-03-01 SURGICAL SUPPLY — 13 items
BALLN TREK RX 2.5X15 (BALLOONS) ×3
BALLOON TREK RX 2.5X15 (BALLOONS) ×1 IMPLANT
CATH INFINITI 5 FR JL3.5 (CATHETERS) ×3 IMPLANT
CATH INFINITI 5FR JK (CATHETERS) ×3 IMPLANT
CATH VISTA GUIDE 6FR JR4 (CATHETERS) ×3 IMPLANT
DEVICE INFLAT 30 PLUS (MISCELLANEOUS) ×3 IMPLANT
DEVICE RAD COMP TR BAND LRG (VASCULAR PRODUCTS) ×3 IMPLANT
GLIDESHEATH SLEND SS 6F .021 (SHEATH) ×3 IMPLANT
GUIDEWIRE INQWIRE 1.5J.035X260 (WIRE) ×1 IMPLANT
INQWIRE 1.5J .035X260CM (WIRE) ×3
KIT MANI 3VAL PERCEP (MISCELLANEOUS) ×3 IMPLANT
PACK CARDIAC CATH (CUSTOM PROCEDURE TRAY) ×3 IMPLANT
WIRE RUNTHROUGH .014X180CM (WIRE) ×3 IMPLANT

## 2020-03-01 NOTE — ED Notes (Signed)
Pt belongings placed in white belongings bag including khaki pants w Philipe Laswell belt, 1 pair white socks, pair grey shoes, White shirt, black cellphone. Sent w pt on stretcher.

## 2020-03-01 NOTE — Progress Notes (Signed)
Patient had scant amount of bloody drainage from right radial site. No hematoma. VVS. Took off TR band and placed guaze and tape over site.

## 2020-03-01 NOTE — Progress Notes (Signed)
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 Continue w/ medical management. Continue on aggrastat, aspirin & brilinta as per cardio. Still w/ chest pain today   DM2: continue on SSI w/ accuchecks. Hold home dose of metformin   Hypertension: hold home dose of telmisartan-HCTZ and restart when ok w/ cardio   Gout: continue on allopurinol, colchicine  Likely BPH: continue on home dose of tamsulosin    DVT prophylaxis: aggrastat Code Status: full  Family Communication: Disposition Plan: depends on PT/OT recs (not yet consulted)   Consultants:   Cardio   Procedures:    Antimicrobials:    Subjective: Pt c/o chest pain and feeling hot.   Objective: Vitals:   03/01/20 0500 03/01/20 0600 03/01/20 0718 03/01/20 0734  BP: 112/84 111/81    Pulse: 90 92 87   Resp: (!) 32 20 (!) 24   Temp:    98.1 F (36.7 C)  TempSrc:    Oral  SpO2: 94% 92%    Weight:      Height:        Intake/Output Summary (Last 24 hours) at 03/01/2020 0825 Last data filed at 03/01/2020 0700 Gross per 24 hour  Intake 135.52 ml  Output 475 ml  Net -339.48 ml   Filed Weights   02/29/20 2203 03/01/20 0135  Weight: 127 kg 120.6 kg    Examination:  General exam: Appears calm but uncomfortable  Respiratory system: Clear to auscultation. No rales, wheezes Cardiovascular system: S1 & S2 + No  rubs, gallops or clicks. No pedal edema. Gastrointestinal system: Abdomen is obese, soft and nontender.Normal bowel sounds heard. Central nervous system: Alert and oriented. Moves all 4 extremities  Psychiatry: Judgement and insight appear  normal. Flat mood and affect.     Data Reviewed: I have personally reviewed following labs and imaging studies  CBC: Recent Labs  Lab 02/29/20 2215 03/01/20 0409  WBC 8.7 8.2  HGB 13.7 13.5  HCT 40.1 40.8  MCV 84.4 86.3  PLT 153 162   Basic Metabolic Panel: Recent Labs  Lab 02/29/20 2215 03/01/20 0409  NA 134* 136  K 3.4* 3.6  CL 99 103  CO2 26 21*  GLUCOSE 166* 127*  BUN 14 13  CREATININE 1.07 0.94  CALCIUM 8.6* 8.3*   GFR: Estimated Creatinine Clearance: 121.4 mL/min (by C-G formula based on SCr of 0.94 mg/dL). Liver Function Tests: No results for input(s): AST, ALT, ALKPHOS, BILITOT, PROT, ALBUMIN in the last 168 hours. No results for input(s): LIPASE, AMYLASE in the last 168 hours. No results for input(s): AMMONIA in the last 168 hours. Coagulation Profile: Recent Labs  Lab 02/29/20 2251  INR 1.1   Cardiac Enzymes: No results for input(s): CKTOTAL, CKMB, CKMBINDEX, TROPONINI in the last 168 hours. BNP (last 3 results) No results for input(s): PROBNP in the last 8760 hours. HbA1C: No results for input(s): HGBA1C in the last 72 hours. CBG: Recent Labs  Lab 02/29/20 2358 03/01/20 0140  GLUCAP 132* 127*   Lipid Profile: Recent Labs    03/01/20 0409  CHOL 208*  HDL 49  LDLCALC 133*  TRIG 128  CHOLHDL 4.2  Thyroid Function Tests: No results for input(s): TSH, T4TOTAL, FREET4, T3FREE, THYROIDAB in the last 72 hours. Anemia Panel: No results for input(s): VITAMINB12, FOLATE, FERRITIN, TIBC, IRON, RETICCTPCT in the last 72 hours. Sepsis Labs: No results for input(s): PROCALCITON, LATICACIDVEN in the last 168 hours.  Recent Results (from the past 240 hour(s))  SARS Coronavirus 2 by RT PCR (hospital order, performed in [facility] hospital lab) Nasopharyngeal Nasopharyngeal Swab     Status: None   Collection Time: 02/29/20 11:46 PM   Specimen: Nasopharyngeal Swab  Result Value Ref Range Status   SARS Coronavirus 2 NEGATIVE NEGATIVE Final     Comment: (NOTE) SARS-CoV-2 target nucleic acids are NOT DETECTED.  The SARS-CoV-2 RNA is generally detectable in upper and lower respiratory specimens during the acute phase of infection. The lowest concentration of SARS-CoV-2 viral copies this assay can detect is 250 copies / mL. A negative result does not preclude SARS-CoV-2 infection and should not be used as the sole basis for treatment or other patient management decisions.  A negative result may occur with improper specimen collection / handling, submission of specimen other than nasopharyngeal swab, presence of viral mutation(s) within the areas targeted by this assay, and inadequate number of viral copies (<250 copies / mL). A negative result must be combined with clinical observations, patient history, and epidemiological information.  Fact Sheet for Patients:   StrictlyIdeas.no  Fact Sheet for Healthcare Providers: BankingDealers.co.za  This test is not yet approved or  cleared by the [place] FDA and has been authorized for detection and/or diagnosis of SARS-CoV-2 by FDA under an Emergency Use Authorization (EUA).  This EUA will remain in effect (meaning this test can be used) for the duration of the COVID-19 declaration under Section 564(b)(1) of the Act, 21 U.S.C. section 360bbb-3(b)(1), unless the authorization is terminated or revoked sooner.  Performed at [facility], [address] [place], [place] [address]   MRSA PCR Screening     Status: Abnormal   Collection Time: 03/01/20  1:36 AM   Specimen: Nasal Mucosa; Nasopharyngeal  Result Value Ref Range Status   MRSA by PCR POSITIVE (A) NEGATIVE Final    Comment:        The GeneXpert MRSA Assay (FDA approved for NASAL specimens only), is one component of a comprehensive MRSA colonization surveillance program. It is not intended to diagnose MRSA infection nor to guide or monitor treatment for MRSA  infections. RESULT CALLED TO, READ BACK BY AND VERIFIED WITH: ALEKSEY FRASER @0307  ON 03/01/20 SKL Performed at [facility], [address] [place], [place] [address]          Radiology Studies: DG Chest 2 View  Result Date: 02/29/2020 CLINICAL DATA:  Worsening fatigue with intermittent cough. EXAM: CHEST - 2 VIEW COMPARISON:  None. FINDINGS: There is no evidence of acute infiltrate, pleural effusion or pneumothorax. The heart size and mediastinal contours are within normal limits. The visualized skeletal structures are unremarkable. IMPRESSION: No active cardiopulmonary disease. Electronically Signed   By: Virgina Norfolk M.D.   On: 02/29/2020 22:30   CARDIAC CATHETERIZATION  Result Date: 1/66/0630  LV end diastolic pressure is moderately elevated.  The left ventricular ejection fraction is 35-45% by visual estimate.  There is moderate left ventricular systolic dysfunction.  Prox RCA to Mid RCA lesion is 100% stenosed.  Post intervention, there is a 100% residual stenosis.  Balloon angioplasty was performed using a BALLOON TREK RX 2.5X15.  Mid LAD lesion is 40% stenosed.  1.  Severe one-vessel coronary artery disease with thrombotic occlusion of the proximal right coronary artery with reasonable left to right collaterals from the LAD.  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 2.  Moderately reduced LV systolic function with an EF of 35 to 40% with inferior wall akinesis. 3.  Moderately elevated left ventricular end-diastolic pressure at 27 mmHg. 4.  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  I felt that continued intervention would carry a significant risk of no reflow due to distal embolization and that would risk the already developed collaterals. Recommendations: This was a case of inferior ST elevation myocardial  infarction with late presentation after more than 24 hours of symptoms.  Initial plan was to treat him medically and consider angiography in the morning but then his chest pain worsened with diaphoresis and I elected to proceed with emergent cardiac catheterization with the above results. The patient was started on Aggrastat to be continued for 18 hours. I started the patient on oral dual antiplatelet therapy with aspirin and ticagrelor. If the patient remains clinically stable, no plans for repeat angiography and the plan is to treat him medically assuming that his left-to-right collaterals will continue to develop. If in the other hand he has recurrent symptoms or instability, relook angiography can be considered after the patient has been treated with intravenous and oral antiplatelet medications. The patient is at high risk for mechanical complications and right ventricular failure.        Scheduled Meds: . allopurinol  100 mg Oral Daily  . aspirin  81 mg Oral Daily  . atorvastatin  80 mg Oral Daily  . Chlorhexidine Gluconate Cloth  6 each Topical Q0600  . colchicine  0.6 mg Oral BID  . mupirocin ointment  1 application Nasal BID  . sodium chloride flush  3 mL Intravenous Q12H  . tamsulosin  0.4 mg Oral Daily  . [START ON 03/02/2020] ticagrelor  90 mg Oral BID   Continuous Infusions: . sodium chloride    . tirofiban 0.15 mcg/kg/min (03/01/20 0149)     LOS: 1 day    Time spent: 32 mins     Charise Killian, MD [facility] Pager [phone]  If 7PM-7AM, please contact night-coverage [email or web address] 03/01/2020, 8:25 AM

## 2020-03-01 NOTE — Progress Notes (Addendum)
    Post cath labs pending this morning. Vitals stable. Telemetry with SR and rare isolated PVC. Right radial cardiac cath site is without active bleeding. He notes some pleuritic chest pain that is improved compared to how he felt at presentation. He currently rates this pain as a 6/10. Radial pulse 2+. Tolerating DAPT and Lipitor. Escalate GDMT as able. Echo pending. Add colchicine 0.6 mg bid.   I have independently seen and examined the patient and agree with the findings and plan, as documented in the PA/NP's note, with the following additions/changes.  We will continue with medical therapy and pursue repeat catheterization only if the patient develops refractory angina and/or hemodynamic/electrical instability.  His echocardiogram shows low normal LVEF with basal and mid inferior/inferoseptal hypokinesis.  Hopefully, we can add low-dose beta-blocker tomorrow if blood pressure tolerates.  Yvonne Kendall, MD [facility]

## 2020-03-01 NOTE — Consult Note (Signed)
Cardiology Consultation:   Patient ID: Joshua Holder MRN: [id number]; DOB: [birth year]  Admit date: 02/29/2020 Date of Consult: 03/01/2020  Primary Care Provider: System, Provider Not In [place] Cardiologist: new [place] Anon) [facility] Electrophysiologist:  None    Patient Profile:   Joshua Holder is a 54 y.o. male with a hx of diabetes mellitus, essential hypertension and gout of  who is being seen today for the evaluation of chest pain and abnormal EKG at the request of Dr. Cherylann Banas and Dr. Sidney Ace.  History of Present Illness:   Joshua Holder is a 54 year old male with past medical history of type 2 diabetes, essential hypertension and gout.  He has no prior cardiac history.  He started having chest pain on Sunday afternoon.  It was rated as 6-7 out of 10 chest pain with tightness feeling substernally radiating to the right and left side.  He felt extremely fatigued with no energy and he slept most of the afternoon.  His chest pain was initially intermittent but then became continuous.  He had shortness of breath.  His wife is out of town in [place].  His symptoms continued today and after he informed her, she insisted on him coming to the emergency room for evaluation.  His EKG showed minimal inferior ST elevation that did not meet criteria for ST elevation myocardial infarction but he was noted to have inferior Q waves.  His chest pain was minimal on presentation.  Troponin came back greater than 20,000.  Considering his symptoms of more than 24 hours, I really elevated troponin and Q waves on EKG, it was felt that his presentation is consistent with late presenting inferior STEMI.  The initial plan was to treat him medically and perform cardiac catheterization in the morning.  However, his chest pain worsened and had associated diaphoresis and thus I recommended proceeding with emergent cardiac catheterization after explained the procedure to him.  I also spoke with his wife on  the phone.   Past Medical History:  Diagnosis Date  . Diabetes 1.5, managed as type 2 ([place])   . Gout   . Hypertension     Past Surgical History:  Procedure Laterality Date  . INCISION AND DRAINAGE Left 12/30/2018   Procedure: INCISION AND DRAINAGE LEFT ELBOW;  Surgeon: Thornton Park, MD;  Location: [facility] ORS;  Service: Orthopedics;  Laterality: Left;     Home Medications:  Prior to Admission medications   Medication Sig Start Date End Date Taking? Authorizing Provider  metFORMIN (GLUCOPHAGE-XR) 500 MG 24 hr tablet Take 1,000 mg by mouth daily with breakfast.   Yes [provider]  tamsulosin (FLOMAX) 0.4 MG CAPS capsule Take 0.4 mg by mouth daily. 11/07/19  Yes [provider]  telmisartan-hydrochlorothiazide (MICARDIS HCT) 40-12.5 MG tablet Take 1 tablet by mouth daily. 11/07/19  Yes [provider]  allopurinol (ZYLOPRIM) 100 MG tablet Take 100 mg by mouth daily. Patient not taking: Reported on 03/01/2020 05/26/15   [provider]  predniSONE (DELTASONE) 20 MG tablet prednisone 20 mg tablet Patient not taking: Reported on 02/29/2020    [provider]  Tadalafil 2.5 MG TABS Take 1 tablet by mouth daily. Patient not taking: Reported on 03/01/2020 02/19/20   [provider]    Inpatient Medications: Scheduled Meds: . allopurinol  100 mg Oral Daily  . aspirin  81 mg Oral Daily  . atorvastatin  80 mg Oral Daily  . Chlorhexidine Gluconate Cloth  6 each Topical Q0600  . sodium chloride  flush  3 mL Intravenous Q12H  . tamsulosin  0.4 mg Oral Daily  . [START ON 03/02/2020] ticagrelor  90 mg Oral BID   Continuous Infusions: . sodium chloride 75 mL/hr at 03/01/20 0148  . sodium chloride    . tirofiban 0.15 mcg/kg/min (03/01/20 0149)   PRN Meds: sodium chloride, acetaminophen, morphine injection, ondansetron (ZOFRAN) IV, sodium chloride flush  Allergies:    Allergies  Allergen Reactions  . Lisinopril Swelling and Cough     Social History:   Social History   Socioeconomic History  . Marital status: Single    Spouse name: Not on file  . Number of children: Not on file  . Years of education: Not on file  . Highest education level: Not on file  Occupational History  . Not on file  Tobacco Use  . Smoking status: Never Smoker  . Smokeless tobacco: Never Used  Substance and Sexual Activity  . Alcohol use: Yes    Comment: social  . Drug use: Not Currently  . Sexual activity: Yes  Other Topics Concern  . Not on file  Social History Narrative  . Not on file   Social Determinants of Health   Financial Resource Strain:   . Difficulty of Paying Living Expenses:   Food Insecurity:   . Worried About Programme researcher, broadcasting/film/video in the Last Year:   . Barista in the Last Year:   Transportation Needs:   . Freight forwarder (Medical):   Marland Kitchen Lack of Transportation (Non-Medical):   Physical Activity:   . Days of Exercise per Week:   . Minutes of Exercise per Session:   Stress:   . Feeling of Stress :   Social Connections:   . Frequency of Communication with Friends and Family:   . Frequency of Social Gatherings with Friends and Family:   . Attends Religious Services:   . Active Member of Clubs or Organizations:   . Attends Banker Meetings:   Marland Kitchen Marital Status:   Intimate Partner Violence:   . Fear of Current or Ex-Partner:   . Emotionally Abused:   Marland Kitchen Physically Abused:   . Sexually Abused:     Family History:   Family history is remarkable for diabetes and hypertension.  ROS:  Please see the history of present illness.   All other ROS reviewed and negative.     Physical Exam/Data:   Vitals:   02/29/20 2352 02/29/20 2353 03/01/20 0034 03/01/20 0135  BP:    (!) 127/94  Pulse: 73 80  79  Resp: (!) 26 15  (!) 25  Temp:    98.1 F (36.7 C)  TempSrc:    Oral  SpO2: 100% 100% 96% 96%  Weight:    120.6 kg  Height:    5\' 11"  (1.803 m)   No intake or output data in the 24  hours ending 03/01/20 0200 Last 3 Weights 03/01/2020 02/29/2020 12/30/2018  Weight (lbs) 265 lb 14 oz 280 lb 267 lb  Weight (kg) 120.6 kg 127.007 kg 121.11 kg     Body mass index is 37.08 kg/m.  General:  Well nourished, well developed, in no acute distress HEENT: normal Lymph: no adenopathy Neck: no JVD Endocrine:  No thryomegaly Vascular: No carotid bruits; FA pulses 2+ bilaterally without bruits  Cardiac:  normal S1, S2; RRR; no murmur  Lungs:  clear to auscultation bilaterally, no wheezing, rhonchi or rales  Abd: soft, nontender, no hepatomegaly  Ext: no edema  Musculoskeletal:  No deformities, BUE and BLE strength normal and equal Skin: warm and dry  Neuro:  CNs 2-12 intact, no focal abnormalities noted Psych:  Normal affect   EKG:  The EKG was personally reviewed and demonstrates: Normal sinus rhythm with minor inferior ST elevation less than 2 mm with inferior Q waves and lateral ST depression. Telemetry:  Telemetry was personally reviewed and demonstrates: Sinus rhythm with sinus arrhythmia  Relevant CV Studies: Echocardiogram is pending  Laboratory Data:  High Sensitivity Troponin:   Recent Labs  Lab 02/29/20 2215 02/29/20 2355  TROPONINIHS 20,077* 21,388*     Chemistry Recent Labs  Lab 02/29/20 2215  NA 134*  K 3.4*  CL 99  CO2 26  GLUCOSE 166*  BUN 14  CREATININE 1.07  CALCIUM 8.6*  GFRNONAA >60  GFRAA >60  ANIONGAP 9    No results for input(s): PROT, ALBUMIN, AST, ALT, ALKPHOS, BILITOT in the last 168 hours. Hematology Recent Labs  Lab 02/29/20 2215  WBC 8.7  RBC 4.75  HGB 13.7  HCT 40.1  MCV 84.4  MCH 28.8  MCHC 34.2  RDW 13.4  PLT 153   BNPNo results for input(s): BNP, PROBNP in the last 168 hours.  DDimer No results for input(s): DDIMER in the last 168 hours.   Radiology/Studies:  DG Chest 2 View  Result Date: 02/29/2020 CLINICAL DATA:  Worsening fatigue with intermittent cough. EXAM: CHEST - 2 VIEW COMPARISON:  None. FINDINGS:  There is no evidence of acute infiltrate, pleural effusion or pneumothorax. The heart size and mediastinal contours are within normal limits. The visualized skeletal structures are unremarkable. IMPRESSION: No active cardiopulmonary disease. Electronically Signed   By: Aram Candela M.D.   On: 02/29/2020 22:30   CARDIAC CATHETERIZATION  Result Date: 03/01/2020  LV end diastolic pressure is moderately elevated.  The left ventricular ejection fraction is 35-45% by visual estimate.  There is moderate left ventricular systolic dysfunction.  Prox RCA to Mid RCA lesion is 100% stenosed.  Post intervention, there is a 100% residual stenosis.  Balloon angioplasty was performed using a BALLOON TREK RX 2.5X15.  Mid LAD lesion is 40% stenosed.  1.  Severe one-vessel coronary artery disease with thrombotic occlusion of the proximal right coronary artery with reasonable left to right collaterals from the LAD.  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 2.  Moderately reduced LV systolic function with an EF of 35 to 40% with inferior wall akinesis. 3.  Moderately elevated left ventricular end-diastolic pressure at 27 mmHg. 4.  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  I felt that continued intervention would carry a significant risk of no reflow due to distal embolization and that would risk the already developed collaterals. Recommendations: This was a case of inferior ST elevation myocardial infarction with late presentation after more than 24 hours of symptoms.  Initial plan was to treat him medically and consider angiography in the morning but then his chest pain worsened with diaphoresis and I elected to proceed with emergent cardiac catheterization with the above results. The patient was started on Aggrastat to be continued for 18 hours. I started the  patient on oral dual antiplatelet therapy with aspirin and ticagrelor. If the patient remains clinically stable, no plans for repeat angiography and the plan is to treat him medically assuming that his left-to-right collaterals will continue  to develop. If in the other hand he has recurrent symptoms or instability, relook angiography can be considered after the patient has been treated with intravenous and oral antiplatelet medications. The patient is at high risk for mechanical complications and right ventricular failure.   TIMI Risk Score for ST  Elevation MI:   The patient's TIMI risk score is 4, which indicates a 7.3% risk of all cause mortality at 30 days.     Assessment and Plan:   1. Late presenting inferior ST elevation myocardial infarction: Initial plan was to treat him with heparin drip, stabilize him medically and perform cardiac catheterization in the morning.  However, he had worsening chest pain and thus emergent cardiac catheterization was done which showed occluded proximal right coronary artery with left-to-right collaterals.  Balloon angioplasty on the RCA was performed but revealed that the vessel was aneurysmal with large amount of thrombus.  This point his chest pain was minimal and I felt that the RCA PCI and stent placement would have a low yield of success and significant risk of distal embolization and worsening myocardial infarction.  I elected to start him on Aggrastat and oral dual antiplatelet therapy.  If he remains relatively stable, the plan is to treat him medically.  If he has recurrent symptoms, relook angiography can be considered during this hospitalization.  The patient is at high risk for mechanical complications and RV failure. 2. Acute systolic heart failure due to myocardial infarction: LVEDP was moderately elevated but will avoid diuresis for now given the possibility of right ventricular failure.  Monitor clinically.  He is allergic to lisinopril which caused  angioedema.  Avoid ACE inhibitors.  Start small dose carvedilol once blood pressure tolerates.  I requested an echocardiogram. 3. Essential hypertension: Switch blood pressure medications to carvedilol and likely small dose ARB once we make sure his blood pressure will tolerate.  His blood pressure was in the low 100 when he left the Cath Lab. 4. Hyperlipidemia: High-dose atorvastatin was started.      For questions or updates, please contact [facility] Please consult [email or web address] for contact info under    Signed, Lorine Bears, MD  03/01/2020 2:00 AM

## 2020-03-01 NOTE — Progress Notes (Signed)
MEDICATION RELATED CONSULT NOTE - INITIAL   Pharmacy Consult for  Tirofiban Indication:   STEMI   Allergies  Allergen Reactions  . Lisinopril Swelling and Cough    Patient Measurements: Height: 5\' 11"  (180.3 cm) Weight: 120.6 kg (265 lb 14 oz) IBW/kg (Calculated) : 75.3 Adjusted Body Weight:   Vital Signs: Temp: 98.1 F (36.7 C) (06/22 0135) Temp Source: Oral (06/22 0135) BP: 127/94 (06/22 0135) Pulse Rate: 79 (06/22 0135) Intake/Output from previous day: No intake/output data recorded. Intake/Output from this shift: No intake/output data recorded.  Labs: Recent Labs    02/29/20 2215  WBC 8.7  HGB 13.7  HCT 40.1  PLT 153  CREATININE 1.07   Estimated Creatinine Clearance: 106.7 mL/min (by C-G formula based on SCr of 1.07 mg/dL).   Microbiology: Recent Results (from the past 720 hour(s))  SARS Coronavirus 2 by RT PCR (hospital order, performed in [facility] hospital lab) Nasopharyngeal Nasopharyngeal Swab     Status: None   Collection Time: 02/29/20 11:46 PM   Specimen: Nasopharyngeal Swab  Result Value Ref Range Status   SARS Coronavirus 2 NEGATIVE NEGATIVE Final    Comment: (NOTE) SARS-CoV-2 target nucleic acids are NOT DETECTED.  The SARS-CoV-2 RNA is generally detectable in upper and lower respiratory specimens during the acute phase of infection. The lowest concentration of SARS-CoV-2 viral copies this assay can detect is 250 copies / mL. A negative result does not preclude SARS-CoV-2 infection and should not be used as the sole basis for treatment or other patient management decisions.  A negative result may occur with improper specimen collection / handling, submission of specimen other than nasopharyngeal swab, presence of viral mutation(s) within the areas targeted by this assay, and inadequate number of viral copies (<250 copies / mL). A negative result must be combined with clinical observations, patient history, and epidemiological  information.  Fact Sheet for Patients:   03/02/20  Fact Sheet for Healthcare Providers: BoilerBrush.com.cy  This test is not yet approved or  cleared by the https://pope.com/ FDA and has been authorized for detection and/or diagnosis of SARS-CoV-2 by FDA under an Emergency Use Authorization (EUA).  This EUA will remain in effect (meaning this test can be used) for the duration of the COVID-19 declaration under Section 564(b)(1) of the Act, 21 U.S.C. section 360bbb-3(b)(1), unless the authorization is terminated or revoked sooner.  Performed at [facility], [address], [place], [place]     Medical History: Past Medical History:  Diagnosis Date  . 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 Gout   . Hypertension     Medications:  Medications Prior to Admission  Medication Sig Dispense Refill Last Dose  . metFORMIN (GLUCOPHAGE-XR) 500 MG 24 hr tablet Take 1,000 mg by mouth daily with breakfast.   Unknown at Unknown  . tamsulosin (FLOMAX) 0.4 MG CAPS capsule Take 0.4 mg by mouth daily.   Unknown at Unknown  . telmisartan-hydrochlorothiazide (MICARDIS HCT) 40-12.5 MG tablet Take 1 tablet by mouth daily.   Unknown at Unknown  . allopurinol (ZYLOPRIM) 100 MG tablet Take 100 mg by mouth daily. (Patient not taking: Reported on 03/01/2020)   Not Taking at Unknown time  . predniSONE (DELTASONE) 20 MG tablet prednisone 20 mg tablet (Patient not taking: Reported on 02/29/2020)   Not Taking at Unknown time  . Tadalafil 2.5 MG TABS Take 1 tablet by mouth daily. (Patient not taking: Reported on 03/01/2020)   Not Taking at Unknown  Assessment: Pharmacy consulted to dose tirofiban in this 54 year old with STEMI , post cardiac cath.   Goal of Therapy:  CrCl = 106.7 ml/min   Plan:  Tirofiban 3175 mcg (25 mcg/kg)  IV X 1 given in cath lab on 6/22 @ 0111.  Tirofiban 0.15 mcg/kg/hr IV started on 6/22 @ 0117 and  scheduled to end in 18 hrs on 6/22 @ 1859.   Zunaira Lamy D 03/01/2020,1:50 AM

## 2020-03-01 NOTE — Progress Notes (Signed)
*  PRELIMINARY RESULTS* Echocardiogram 2D Echocardiogram has been performed.  Joshua Holder 03/01/2020, 9:02 AM

## 2020-03-02 DIAGNOSIS — I4891 Unspecified atrial fibrillation: Secondary | ICD-10-CM | POA: Diagnosis not present

## 2020-03-02 DIAGNOSIS — I255 Ischemic cardiomyopathy: Secondary | ICD-10-CM | POA: Clinically undetermined

## 2020-03-02 DIAGNOSIS — I2111 ST elevation (STEMI) myocardial infarction involving right coronary artery: Secondary | ICD-10-CM

## 2020-03-02 LAB — CBC
HCT: 34.2 % — ABNORMAL LOW (ref 39.0–52.0)
Hemoglobin: 11.4 g/dL — ABNORMAL LOW (ref 13.0–17.0)
MCH: 28.9 pg (ref 26.0–34.0)
MCHC: 33.3 g/dL (ref 30.0–36.0)
MCV: 86.6 fL (ref 80.0–100.0)
Platelets: 156 K/uL (ref 150–400)
RBC: 3.95 MIL/uL — ABNORMAL LOW (ref 4.22–5.81)
RDW: 13.6 % (ref 11.5–15.5)
WBC: 6.7 K/uL (ref 4.0–10.5)
nRBC: 0 % (ref 0.0–0.2)

## 2020-03-02 LAB — GLUCOSE, CAPILLARY
Glucose-Capillary: 197 mg/dL — ABNORMAL HIGH (ref 70–99)
Glucose-Capillary: 166 mg/dL — ABNORMAL HIGH (ref 70–99)

## 2020-03-02 LAB — BASIC METABOLIC PANEL
Anion gap: 9 (ref 5–15)
BUN: 16 mg/dL (ref 6–20)
CO2: 26 mmol/L (ref 22–32)
Calcium: 8.3 mg/dL — ABNORMAL LOW (ref 8.9–10.3)
Chloride: 99 mmol/L (ref 98–111)
Creatinine, Ser: 0.98 mg/dL (ref 0.61–1.24)
GFR calc Af Amer: >60 mL/min (ref >60)
GFR calc non Af Amer: >60 mL/min (ref >60)
Glucose, Bld: 155 mg/dL — ABNORMAL HIGH (ref 70–99)
Potassium: 3.6 mmol/L (ref 3.5–5.1)
Sodium: 134 mmol/L — ABNORMAL LOW (ref 135–145)

## 2020-03-02 LAB — MAGNESIUM: Magnesium: 1.9 mg/dL (ref 1.7–2.4)

## 2020-03-02 LAB — HEPARIN LEVEL (UNFRACTIONATED): Heparin Unfractionated: <0.1 IU/mL — ABNORMAL LOW (ref 0.30–0.70)

## 2020-03-02 LAB — HEMOGLOBIN A1C
Hgb A1c MFr Bld: 7 % — ABNORMAL HIGH (ref 4.8–5.6)
Mean Plasma Glucose: 154 mg/dL

## 2020-03-02 LAB — TSH: TSH: 5.222 u[IU]/mL — ABNORMAL HIGH (ref 0.350–4.500)

## 2020-03-02 MED ORDER — AMIODARONE HCL IN DEXTROSE 360-4.14 MG/200ML-% IV SOLN
60.0000 mg/h | INTRAVENOUS | Status: AC
  Administered 2020-03-02 (×2): 60 mg/h via INTRAVENOUS
  Filled 2020-03-02 (×2): qty 200

## 2020-03-02 MED ORDER — AMIODARONE LOAD VIA INFUSION
150.0000 mg | Freq: Once | INTRAVENOUS | Status: AC
  Administered 2020-03-02: 150 mg via INTRAVENOUS
  Filled 2020-03-02: qty 83.34

## 2020-03-02 MED ORDER — HEPARIN (PORCINE) 25000 UT/250ML-% IV SOLN
1750.0000 [IU]/h | INTRAVENOUS | Status: DC
  Administered 2020-03-02: 1400 [IU]/h via INTRAVENOUS
  Administered 2020-03-02: 1750 [IU]/h via INTRAVENOUS
  Filled 2020-03-02 (×2): qty 250

## 2020-03-02 MED ORDER — HEPARIN BOLUS VIA INFUSION
3000.0000 [IU] | Freq: Once | INTRAVENOUS | Status: AC
  Administered 2020-03-02: 3000 [IU] via INTRAVENOUS
  Filled 2020-03-02: qty 3000

## 2020-03-02 MED ORDER — METOPROLOL TARTRATE 25 MG PO TABS
12.5000 mg | ORAL_TABLET | Freq: Two times a day (BID) | ORAL | Status: DC
  Administered 2020-03-02 – 2020-03-04 (×5): 12.5 mg via ORAL
  Filled 2020-03-02 (×5): qty 1

## 2020-03-02 MED ORDER — AMIODARONE HCL IN DEXTROSE 360-4.14 MG/200ML-% IV SOLN
30.0000 mg/h | INTRAVENOUS | Status: DC
  Administered 2020-03-02 – 2020-03-03 (×2): 30 mg/h via INTRAVENOUS
  Filled 2020-03-02 (×2): qty 200

## 2020-03-02 MED ORDER — INSULIN ASPART 100 UNIT/ML ~~LOC~~ SOLN
0.0000 [IU] | Freq: Three times a day (TID) | SUBCUTANEOUS | Status: DC
  Administered 2020-03-02: 2 [IU] via SUBCUTANEOUS
  Administered 2020-03-03: 1 [IU] via SUBCUTANEOUS
  Filled 2020-03-02 (×2): qty 1

## 2020-03-02 MED ORDER — INSULIN ASPART 100 UNIT/ML ~~LOC~~ SOLN: 0.0000 [IU] | Freq: Every day | SUBCUTANEOUS | Status: DC

## 2020-03-02 NOTE — Progress Notes (Signed)
ANTICOAGULATION CONSULT NOTE  Pharmacy Consult for heparin Indication: atrial fibrillation  Allergies  Allergen Reactions  . Lisinopril Swelling and Cough    Patient Measurements: Height: 5\' 11"  (180.3 cm) Weight: 120.6 kg (265 lb 14 oz) IBW/kg (Calculated) : 75.3 Heparin Dosing Weight: 102 kg  Vital Signs: Temp: 98.6 F (37 C) (06/23 0800) Temp Source: Oral (06/23 0800) BP: 112/83 (06/23 1000) Pulse Rate: 108 (06/23 1000)  Labs: Recent Labs    02/29/20 2215 02/29/20 2215 02/29/20 2251 02/29/20 2355 03/01/20 0202 03/01/20 0409 03/02/20 0423  HGB 13.7   < >  --   --   --  13.5 11.4*  HCT 40.1  --   --   --   --  40.8 34.2*  PLT 153  --   --   --   --  162 156  LABPROT  --   --  13.9  --   --   --   --   INR  --   --  1.1  --   --   --   --   CREATININE 1.07  --   --   --   --  0.94 0.98  TROPONINIHS 20,077*   < >  --  21,388* 19,065* 17,858*  --    < > = values in this interval not displayed.    Estimated Creatinine Clearance: 116.5 mL/min (by C-G formula based on SCr of 0.98 mg/dL).   Medical History: Past Medical History:  Diagnosis Date  . 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 Gout   . Hypertension      Assessment: 54 year old male presented with chest pain and inferior STEMI. S/p cardiac cath 6/22 showing occluded RCA. 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 Patient received Aggrastat and was started on DAPT. Patient went into afib w/ RVR 6/23 morning and was placed on amiodarone drip and beta blocker. Pharmacy consulted to start heparin drip.      Goal of Therapy:  Heparin level 0.3-0.7 units/ml Monitor platelets by anticoagulation protocol: Yes   Plan:  Heparin 3000 unit bolus followed by heparin drip at 1400 units/hr. Check HL at 1700. CBC with morning labs.  7/23, PharmD 03/02/2020,11:26 AM

## 2020-03-02 NOTE — Progress Notes (Signed)
Patient remains stable on amiodarone 30mg /hr and Heparin 1400units/hr.  He has consumed little food.  His breath sounds are clear but he complains of chest pain multiple times for which he received morphine 2mg  IV push.  I also provided patient with 1L [place] when he stated he was having chest pain.  Wife to bedside today and I educated family about patient therapy and the medical modalities for post stemi.

## 2020-03-02 NOTE — Progress Notes (Signed)
PROGRESS NOTE    Joshua Holder  WUJ:[id number] DOB: [birth year] DOA: 02/29/2020 PCP: System, Provider Not In    Brief Narrative:  54 year old gentleman with history of type 2 diabetes on Metformin, gout and hypertension presented to the emergency room on 02/29/2020 with 1 day of recurrent midsternal chest pain and chest tightness. Has history of premature cardiac disease in family. In the emergency room, respiratory 24. Blood pressure stable. Heart rate 81. Troponin 20,000. Chest x-ray normal. EKG showed inferior ST segment depression. Patient was given aspirin, IV heparin bolus, IV morphine. He had recurrence of chest pain and was taken to cardiac Cath Lab emergently.   Assessment & Plan:   Principal Problem:   Acute ST elevation myocardial infarction (STEMI) of inferior wall (HCC) Active Problems:   DM (diabetes mellitus) (HCC)   HTN (hypertension)   Gout   Acute myocardial infarction [facility])   Atrial fibrillation with RVR (HCC)   Ischemic cardiomyopathy  Acute ST elevation MI of inferior wall: Status post cardiac cath for PCI revealing a thrombotic occlusion of the proximal/mid RCA. PTCA was performed, found to have severe aneurysmal disease, late presentation, left to right collaterals and no stenting was done due to risk of rupture. High thrombus load, treated with Aggrastat. Currently remains on aspirin, Brilinta as well as heparin infusion. Remains on beta-blockers. Continue ICU level of care, continue close monitoring. Followed by cardiology.  A. fib with RVR: New onset. Developed A. fib 6/23 morning. Symptomatic with palpitations and chest discomfort. TSH pending. Magnesium pending. Loaded with amiodarone and continue on amiodarone infusion with rate controlled now. Added beta-blockers. Heparin for anticoagulation. Plan for cardioversion if no chemical conversion as per cardiology.  Ischemic cardiomyopathy, acute heart failure with reduced ejection fraction: Ejection  fraction 35-45% on cardiac cath and ventriculogram, echocardiogram with ejection fraction 50-55% on 6/22. Euvolemic. Continue to monitor. Added metoprolol. Not restarted on ACE secondary to RV infarct.  Type 2 diabetes: On Metformin at home. A1c 7. Well-controlled. Will start patient on sliding scale insulin. Holding Metformin until discharge.   DVT prophylaxis: Heparin infusion   Code Status: Full code Family Communication: None Disposition Plan: Status is: Inpatient  Remains inpatient appropriate because:Ongoing diagnostic testing needed not appropriate for outpatient work up, IV treatments appropriate due to intensity of illness or inability to take PO and Inpatient level of care appropriate due to severity of illness   Dispo: The patient is from: Home              Anticipated d/c is to: Home              Anticipated d/c date is: 3 days              Patient currently is not medically stable to d/c.         Consultants:   Cardiology  Procedures:   Cardiac cath  Antimicrobials:   None   Subjective: Patient seen and examined in the morning rounds. Overnight events noted. Overnight he had mild chest discomfort but no severe symptoms. Today morning, he had sudden onset of severe chest pain and discomfort with palpitations, found to have A. fib with RVR with heart rate 140s. Started on amiodarone and heparin infusion. Currently pain better controlled after amiodarone infusion and morphine injection.  Objective: Vitals:   03/02/20 1200 03/02/20 1300 03/02/20 1400 03/02/20 1500  BP: 105/86 106/82 112/83 100/73  Pulse: 86 80 68 64  Resp: (!) 28 (!) 30 20 (!) 21  Temp: 99 F (  37.2 C)     TempSrc: Oral     SpO2: 100% 98% 96% 95%  Weight:      Height:        Intake/Output Summary (Last 24 hours) at 03/02/2020 1648 Last data filed at 03/02/2020 1400 Gross per 24 hour  Intake 528.51 ml  Output 1075 ml  Net -546.49 ml   Filed Weights   02/29/20 2203 03/01/20 0135    Weight: 127 kg 120.6 kg    Examination:  General exam: Appears calm and comfortable  Respiratory system: Clear to auscultation. Respiratory effort normal. Cardiovascular system: S1 & S2 heard, irregularly irregular. No JVD, murmurs, rubs, gallops or clicks. No pedal edema. Gastrointestinal system: Abdomen is nondistended, soft and nontender. No organomegaly or masses felt. Normal bowel sounds heard. Central nervous system: Alert and oriented. No focal neurological deficits. Extremities: Symmetric 5 x 5 power. Right wrist incision site clean and dry. Skin: No rashes, lesions or ulcers Psychiatry: Judgement and insight appear normal. Mood & affect anxious.    Data Reviewed: I have personally reviewed following labs and imaging studies  CBC: Recent Labs  Lab 02/29/20 2215 03/01/20 0409 03/02/20 0423  WBC 8.7 8.2 6.7  HGB 13.7 13.5 11.4*  HCT 40.1 40.8 34.2*  MCV 84.4 86.3 86.6  PLT 153 162 156   Basic Metabolic Panel: Recent Labs  Lab 02/29/20 2215 03/01/20 0409 03/02/20 0423  NA 134* 136 134*  K 3.4* 3.6 3.6  CL 99 103 99  CO2 26 21* 26  GLUCOSE 166* 127* 155*  BUN 14 13 16   CREATININE 1.07 0.94 0.98  CALCIUM 8.6* 8.3* 8.3*   GFR: Estimated Creatinine Clearance: 116.5 mL/min (by C-G formula based on SCr of 0.98 mg/dL). Liver Function Tests: No results for input(s): AST, ALT, ALKPHOS, BILITOT, PROT, ALBUMIN in the last 168 hours. No results for input(s): LIPASE, AMYLASE in the last 168 hours. No results for input(s): AMMONIA in the last 168 hours. Coagulation Profile: Recent Labs  Lab 02/29/20 2251  INR 1.1   Cardiac Enzymes: No results for input(s): CKTOTAL, CKMB, CKMBINDEX, TROPONINI in the last 168 hours. BNP (last 3 results) No results for input(s): PROBNP in the last 8760 hours. HbA1C: Recent Labs    03/01/20 0409  HGBA1C 7.0*   CBG: Recent Labs  Lab 02/29/20 2358 03/01/20 0140  GLUCAP 132* 127*   Lipid Profile: Recent Labs     03/01/20 0409  CHOL 208*  HDL 49  LDLCALC 133*  TRIG 128  CHOLHDL 4.2   Thyroid Function Tests: No results for input(s): TSH, T4TOTAL, FREET4, T3FREE, THYROIDAB in the last 72 hours. Anemia Panel: No results for input(s): VITAMINB12, FOLATE, FERRITIN, TIBC, IRON, RETICCTPCT in the last 72 hours. Sepsis Labs: No results for input(s): PROCALCITON, LATICACIDVEN in the last 168 hours.  Recent Results (from the past 240 hour(s))  SARS Coronavirus 2 by RT PCR (hospital order, performed in [facility] hospital lab) Nasopharyngeal Nasopharyngeal Swab     Status: None   Collection Time: 02/29/20 11:46 PM   Specimen: Nasopharyngeal Swab  Result Value Ref Range Status   SARS Coronavirus 2 NEGATIVE NEGATIVE Final    Comment: (NOTE) SARS-CoV-2 target nucleic acids are NOT DETECTED.  The SARS-CoV-2 RNA is generally detectable in upper and lower respiratory specimens during the acute phase of infection. The lowest concentration of SARS-CoV-2 viral copies this assay can detect is 250 copies / mL. A negative result does not preclude SARS-CoV-2 infection and should not be used as the  sole basis for treatment or other patient management decisions.  A negative result may occur with improper specimen collection / handling, submission of specimen other than nasopharyngeal swab, presence of viral mutation(s) within the areas targeted by this assay, and inadequate number of viral copies (<250 copies / mL). A negative result must be combined with clinical observations, patient history, and epidemiological information.  Fact Sheet for Patients:   BoilerBrush.com.cy  Fact Sheet for Healthcare Providers: https://pope.com/  This test is not yet approved or  cleared by the [place] FDA and has been authorized for detection and/or diagnosis of SARS-CoV-2 by FDA under an Emergency Use Authorization (EUA).  This EUA will remain in effect (meaning this  test can be used) for the duration of the COVID-19 declaration under Section 564(b)(1) of the Act, 21 U.S.C. section 360bbb-3(b)(1), unless the authorization is terminated or revoked sooner.  Performed at [facility], [address] [place], [place] [address]   MRSA PCR Screening     Status: Abnormal   Collection Time: 03/01/20  1:36 AM   Specimen: Nasal Mucosa; Nasopharyngeal  Result Value Ref Range Status   MRSA by PCR POSITIVE (A) NEGATIVE Final    Comment:        The GeneXpert MRSA Assay (FDA approved for NASAL specimens only), is one component of a comprehensive MRSA colonization surveillance program. It is not intended to diagnose MRSA infection nor to guide or monitor treatment for MRSA infections. RESULT CALLED TO, READ BACK BY AND VERIFIED WITH: ALEKSEY FRASER @0307  ON 03/01/20 SKL Performed at [facility], [address] [place], [place] [address]          Radiology Studies: DG Chest 2 View  Result Date: 02/29/2020 CLINICAL DATA:  Worsening fatigue with intermittent cough. EXAM: CHEST - 2 VIEW COMPARISON:  None. FINDINGS: There is no evidence of acute infiltrate, pleural effusion or pneumothorax. The heart size and mediastinal contours are within normal limits. The visualized skeletal structures are unremarkable. IMPRESSION: No active cardiopulmonary disease. Electronically Signed   By: Aram Candela M.D.   On: 02/29/2020 22:30   CARDIAC CATHETERIZATION  Result Date: 03/01/2020  LV end diastolic pressure is moderately elevated.  The left ventricular ejection fraction is 35-45% by visual estimate.  There is moderate left ventricular systolic dysfunction.  Prox RCA to Mid RCA lesion is 100% stenosed.  Post intervention, there is a 100% residual stenosis.  Balloon angioplasty was performed using a BALLOON TREK RX 2.5X15.  Mid LAD lesion is 40% stenosed.  1.  Severe one-vessel coronary artery disease with thrombotic occlusion of the  proximal right coronary artery with reasonable left to right collaterals from the LAD.  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 2.  Moderately reduced LV systolic function with an EF of 35 to 40% with inferior wall akinesis. 3.  Moderately elevated left ventricular end-diastolic pressure at 27 mmHg. 4.  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  I felt that continued intervention would carry a significant risk of no reflow due to distal embolization and that would risk the already developed collaterals. Recommendations: This was a case of inferior ST elevation myocardial infarction with late presentation after more than 24 hours of symptoms.  Initial plan was to treat him medically and consider angiography in the morning but then his chest pain worsened with diaphoresis and I  elected to proceed with emergent cardiac catheterization with the above results. The patient was started on Aggrastat to be continued for 18 hours. I started the patient on oral dual antiplatelet therapy with aspirin and ticagrelor. If the patient remains clinically stable, no plans for repeat angiography and the plan is to treat him medically assuming that his left-to-right collaterals will continue to develop. If in the other hand he has recurrent symptoms or instability, relook angiography can be considered after the patient has been treated with intravenous and oral antiplatelet medications. The patient is at high risk for mechanical complications and right ventricular failure.   ECHOCARDIOGRAM COMPLETE  Result Date: 03/01/2020    ECHOCARDIOGRAM REPORT   Patient Name:   MACALLISTER ASHMEAD Date of Exam: 03/01/2020 Medical Rec #:  [id number]       Height:       71.0 in Accession #:    [id number]      Weight:       265.9 lb Date of Birth:  1966-07-22      BSA:          2.380 m  Patient Age:    34 years        BP:           107/81 mmHg Patient Gender: M               HR:           88 bpm. Exam Location:  [facility] Procedure: 2D Echo, Color Doppler, Cardiac Doppler and Intracardiac            Opacification Agent Indications:     Acute myocardial infarction 410  History:         Patient has no prior history of Echocardiogram examinations.                  Risk Factors:Hypertension and Diabetes.  Sonographer:     Charmayne Sheer RDCS (AE) Referring Phys:  4 [facility] A ARIDA Diagnosing Phys: Harrell Gave End MD  Sonographer Comments: Suboptimal apical window and suboptimal subcostal window. Image acquisition challenging due to patient body habitus. IMPRESSIONS  1. Left ventricular ejection fraction, by estimation, is 50 to 55%. The left ventricle has low normal function. The left ventricle demonstrates regional wall motion abnormalities (see scoring diagram/findings for description). There is mild left ventricular hypertrophy. Left ventricular diastolic parameters were normal. There is severe hypokinesis of the left ventricular, basal-mid inferior wall and inferoseptal wall.  2. Right ventricular systolic function is normal. The right ventricular size is not well visualized. There is normal pulmonary artery systolic pressure.  3. The mitral valve is normal in structure. Trivial mitral valve regurgitation. No evidence of mitral stenosis.  4. The aortic valve is tricuspid. Aortic valve regurgitation is not visualized. No aortic stenosis is present.  5. The inferior vena cava is dilated in size with <50% respiratory variability, suggesting right atrial pressure of 15 mmHg. FINDINGS  Left Ventricle: Left ventricular ejection fraction, by estimation, is 50 to 55%. The left ventricle has low normal function. The left ventricle demonstrates regional wall motion abnormalities. Severe hypokinesis of the left ventricular, basal-mid inferior wall and inferoseptal wall. Definity contrast agent was given IV to  delineate the left ventricular endocardial borders. The left ventricular internal cavity size was normal in size. There is mild left ventricular hypertrophy. Left ventricular diastolic parameters were normal. Right Ventricle: The right ventricular size is not well visualized. No increase in right ventricular wall thickness. Right ventricular systolic  function is normal. There is normal pulmonary artery systolic pressure. The tricuspid regurgitant velocity is 1.98 m/s, and with an assumed right atrial pressure of 15 mmHg, the estimated right ventricular systolic pressure is 30.7 mmHg. Left Atrium: Left atrial size was normal in size. Right Atrium: Right atrial size was normal in size. Pericardium: There is no evidence of pericardial effusion. Mitral Valve: The mitral valve is normal in structure. Trivial mitral valve regurgitation. No evidence of mitral valve stenosis. MV peak gradient, 2.6 mmHg. The mean mitral valve gradient is 1.0 mmHg. Tricuspid Valve: The tricuspid valve is grossly normal. Tricuspid valve regurgitation is trivial. Aortic Valve: The aortic valve is tricuspid. Aortic valve regurgitation is not visualized. No aortic stenosis is present. Aortic valve mean gradient measures 4.0 mmHg. Aortic valve peak gradient measures 8.8 mmHg. Aortic valve area, by VTI measures 4.95 cm. Pulmonic Valve: The pulmonic valve was not well visualized. Pulmonic valve regurgitation is not visualized. No evidence of pulmonic stenosis. Aorta: The aortic root is normal in size and structure. Pulmonary Artery: The pulmonary artery is of normal size. Venous: The inferior vena cava is dilated in size with less than 50% respiratory variability, suggesting right atrial pressure of 15 mmHg. IAS/Shunts: The interatrial septum was not well visualized.  LEFT VENTRICLE PLAX 2D LVIDd:         4.89 cm  Diastology LVIDs:         3.81 cm  LV e' lateral:   12.10 cm/s LV PW:         1.18 cm  LV E/e' lateral: 6.3 LV IVS:        1.07 cm  LV  e' medial:    8.38 cm/s LVOT diam:     2.80 cm  LV E/e' medial:  9.2 LV SV:         86 LV SV Index:   36 LVOT Area:     6.16 cm  LEFT ATRIUM             Index       RIGHT ATRIUM           Index LA diam:        2.90 cm 1.22 cm/m  RA Area:     16.18 cm 6.80 cm/m LA Vol (A2C):   45.1 ml 18.95 ml/m LA Vol (A4C):   42.6 ml 17.90 ml/m LA Biplane Vol: 46.7 ml 19.62 ml/m  AORTIC VALVE                   PULMONIC VALVE AV Area (Vmax):    3.72 cm    PV Vmax:       1.18 m/s AV Area (Vmean):   4.73 cm    PV Vmean:      84.600 cm/s AV Area (VTI):     4.95 cm    PV VTI:        0.205 m AV Vmax:           148.00 cm/s PV Peak grad:  5.6 mmHg AV Vmean:          87.000 cm/s PV Mean grad:  3.0 mmHg AV VTI:            0.174 m AV Peak Grad:      8.8 mmHg AV Mean Grad:      4.0 mmHg LVOT Vmax:         89.40 cm/s LVOT Vmean:        66.800 cm/s LVOT VTI:  0.140 m LVOT/AV VTI ratio: 0.80  AORTA Ao Root diam: 3.20 cm MITRAL VALVE               TRICUSPID VALVE MV Area (PHT): 3.93 cm    TR Peak grad:   15.7 mmHg MV Peak grad:  2.6 mmHg    TR Vmax:        198.00 cm/s MV Mean grad:  1.0 mmHg MV Vmax:       0.80 m/s    SHUNTS MV Vmean:      55.8 cm/s   Systemic VTI:  0.14 m MV Decel Time: 193 msec    Systemic Diam: 2.80 cm MV E velocity: 76.70 cm/s MV A velocity: 54.40 cm/s MV E/A ratio:  1.41 Cristal Deer End MD Electronically signed by Yvonne Kendall MD Signature Date/Time: 03/01/2020/10:11:39 AM    Final         Scheduled Meds: . allopurinol  100 mg Oral Daily  . aspirin  81 mg Oral Daily  . atorvastatin  80 mg Oral Daily  . Chlorhexidine Gluconate Cloth  6 each Topical Q0600  . colchicine  0.6 mg Oral BID  . insulin aspart  0-5 Units Subcutaneous QHS  . insulin aspart  0-9 Units Subcutaneous TID WC  . metoprolol tartrate  12.5 mg Oral BID  . mupirocin ointment  1 application Nasal BID  . sodium chloride flush  3 mL Intravenous Q12H  . tamsulosin  0.4 mg Oral Daily  . ticagrelor  90 mg Oral BID    Continuous Infusions: . sodium chloride    . amiodarone 30 mg/hr (03/02/20 1419)  . heparin 1,400 Units/hr (03/02/20 1200)     LOS: 2 days    Time spent: 30 minutes    Dorcas Carrow, MD [facility] Pager [phone]

## 2020-03-02 NOTE — Progress Notes (Signed)
ANTICOAGULATION CONSULT NOTE  Pharmacy Consult for heparin Indication: atrial fibrillation  Allergies  Allergen Reactions  . Lisinopril Swelling and Cough    Patient Measurements: Height: 5\' 11"  (180.3 cm) Weight: 120.6 kg (265 lb 14 oz) IBW/kg (Calculated) : 75.3 Heparin Dosing Weight: 102 kg  Vital Signs: Temp: 98 F (36.7 C) (06/23 1600) Temp Source: Oral (06/23 1600) BP: 122/99 (06/23 1800) Pulse Rate: 66 (06/23 1800)  Labs: Recent Labs    02/29/20 2215 02/29/20 2215 02/29/20 2251 02/29/20 2355 03/01/20 0202 03/01/20 0409 03/02/20 0423 03/02/20 1715  HGB 13.7   < >  --   --   --  13.5 11.4*  --   HCT 40.1  --   --   --   --  40.8 34.2*  --   PLT 153  --   --   --   --  162 156  --   LABPROT  --   --  13.9  --   --   --   --   --   INR  --   --  1.1  --   --   --   --   --   HEPARINUNFRC  --   --   --   --   --   --   --  <0.10*  CREATININE 1.07  --   --   --   --  0.94 0.98  --   TROPONINIHS 20,077*   < >  --  21,388* 19,065* 17,858*  --   --    < > = values in this interval not displayed.    Estimated Creatinine Clearance: 116.5 mL/min (by C-G formula based on SCr of 0.98 mg/dL).   Medical History: Past Medical History:  Diagnosis Date  . 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 Gout   . Hypertension      Assessment: 54 year old male presented with chest pain and inferior STEMI. S/p cardiac cath 6/22 showing occluded RCA. 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 Patient received Aggrastat and was started on DAPT. Patient went into afib w/ RVR 6/23 morning and was placed on amiodarone drip and beta blocker. Pharmacy consulted to start heparin drip.     Patient was started on heparin drip with 3000 unit bolus followed by a drip rate of 1400 units/hr.   7/23 1715 HL < 0.10  -subtherapeutic  Goal of Therapy:  Heparin level 0.3-0.7 units/ml Monitor platelets by anticoagulation protocol: Yes   Plan:  Will bolus  another 3000 units and increase rate to 1750 units/hr  Will recheck HL in 6 hours per protocol and check CBC daily  [id number], PharmD, BCPS Clinical Pharmacist 03/02/2020 8:03 PM

## 2020-03-02 NOTE — Progress Notes (Signed)
Upon assessment patient AOx4.  He complains of chest pain 7/10.  His heart rhythm has changed to atrial fib.  Delivered 2mg  morphine IV push.  Cardiologist at bedside and ordered amiodarone bolus and drip for a-fib.

## 2020-03-02 NOTE — Progress Notes (Signed)
Progress Note  Patient Name: Joshua Holder Date of Encounter: 03/02/2020  Primary Cardiologist: Kathlyn Sacramento, MD  Subjective   3/10 c/p throughout the night, which worsened this AM just after 7am.  At 7:06 AM, he went into Afib w/ RVR, rates 110-130's.  ECG w/o acute ST/T changes.  C/p improved following morphine inj and amio bolus w/ rates dropping ot 90's.  Inpatient Medications    Scheduled Meds:  allopurinol  100 mg Oral Daily   amiodarone  150 mg Intravenous Once   aspirin  81 mg Oral Daily   atorvastatin  80 mg Oral Daily   Chlorhexidine Gluconate Cloth  6 each Topical Q0600   colchicine  0.6 mg Oral BID   mupirocin ointment  1 application Nasal BID   sodium chloride flush  3 mL Intravenous Q12H   tamsulosin  0.4 mg Oral Daily   ticagrelor  90 mg Oral BID   Continuous Infusions:  sodium chloride     amiodarone     Followed by   amiodarone     PRN Meds: sodium chloride, acetaminophen, morphine injection, ondansetron (ZOFRAN) IV, sodium chloride flush   Vital Signs    Vitals:   03/02/20 0500 03/02/20 0600 03/02/20 0700 03/02/20 0741  BP: 108/74 109/69 119/85   Pulse:  84 82 (!) 38  Resp: 16 19 (!) 27 (!) 21  Temp:    98.6 F (37 C)  TempSrc:      SpO2:  99% 99% 93%  Weight:      Height:        Intake/Output Summary (Last 24 hours) at 03/02/2020 0744 Last data filed at 03/02/2020 0251 Gross per 24 hour  Intake 359.07 ml  Output 675 ml  Net -315.93 ml   Filed Weights   02/29/20 2203 03/01/20 0135  Weight: 127 kg 120.6 kg    Physical Exam   GEN: Well nourished, well developed, in no acute distress.  HEENT: Grossly normal.  Neck: Supple, no JVD, carotid bruits, or masses. Cardiac: IR, IR, no murmurs, rubs, or gallops. No clubbing, cyanosis, edema.  Radials/DP/PT 2+ and equal bilaterally. R radial cath site w/o bleeding/bruit/hematoma. Respiratory:  Respirations regular and unlabored, diminished breath sounds @ bilat bases. GI:  Soft, nontender, nondistended, BS + x 4. MS: no deformity or atrophy. Skin: warm and dry, no rash. Neuro:  Strength and sensation are intact. Psych: AAOx3.  Normal affect.  Labs    Chemistry Recent Labs  Lab 02/29/20 2215 03/01/20 0409 03/02/20 0423  NA 134* 136 134*  K 3.4* 3.6 3.6  CL 99 103 99  CO2 26 21* 26  GLUCOSE 166* 127* 155*  BUN 14 13 16   CREATININE 1.07 0.94 0.98  CALCIUM 8.6* 8.3* 8.3*  GFRNONAA >60 >60 >60  GFRAA >60 >60 >60  ANIONGAP 9 12 9      Hematology Recent Labs  Lab 02/29/20 2215 03/01/20 0409 03/02/20 0423  WBC 8.7 8.2 6.7  RBC 4.75 4.73 3.95*  HGB 13.7 13.5 11.4*  HCT 40.1 40.8 34.2*  MCV 84.4 86.3 86.6  MCH 28.8 28.5 28.9  MCHC 34.2 33.1 33.3  RDW 13.4 13.8 13.6  PLT 153 162 156    Cardiac Enzymes  Recent Labs  Lab 02/29/20 2215 02/29/20 2355 03/01/20 0202 03/01/20 0409  TROPONINIHS 20,077* 21,388* 19,065* 17,858*      Radiology    DG Chest 2 View  Result Date: 02/29/2020 CLINICAL DATA:  Worsening fatigue with intermittent cough. EXAM: CHEST - 2 VIEW COMPARISON:  None. FINDINGS: There is no evidence of acute infiltrate, pleural effusion or pneumothorax. The heart size and mediastinal contours are within normal limits. The visualized skeletal structures are unremarkable. IMPRESSION: No active cardiopulmonary disease. Electronically Signed   By: Aram Candela M.D.   On: 02/29/2020 22:30   Telemetry    RSR/Sinus Tach  Afib @ 7:06 this AM - 110-130's. Brief run of NSVT this AM - Personally Reviewed  Cardiac Studies   Cardiac Catheterization & PTCA 6.22.2021  Left Main  The vessel exhibits minimal luminal irregularities.  Left Anterior Descending  Mid LAD lesion is 40% stenosed.  First Diagonal Branch  Vessel is large in size. The vessel exhibits minimal luminal irregularities.  Left Circumflex  Vessel is angiographically normal.  Right Coronary Artery  Prox RCA to Mid RCA lesion is 100% stenosed. Vessel is the  culprit lesion. The lesion is type C and heavily thrombotic with left-to-right collateral flow. The lesion was not previously treated.  Right Posterior Descending Artery  Collaterals  RPDA filled by collaterals from Dist LAD.    Second Right Posterolateral Branch  Collaterals  2nd RPL filled by collaterals from Dist LAD.   Multiple inflations were performed to the proximal and mid right coronary artery. This established TIMI I flow only and at this point it was clear that the proximal RCA was aneurysmal with large amount of thrombus. Given late presentation as well as the presence of left-to-right collaterals, I felt that the risks of continued PCI outweigh the benefits considering that there is high risk for distal embolization, mechanical complications and no reflow.  fraction is 35-45%.  _____________  2D Echocardiogram 6.21.2021  1. Left ventricular ejection fraction, by estimation, is 50 to 55%. The  left ventricle has low normal function. The left ventricle demonstrates  regional wall motion abnormalities (see scoring diagram/findings for  description). There is mild left  ventricular hypertrophy. Left ventricular diastolic parameters were  normal. There is severe hypokinesis of the left ventricular, basal-mid  inferior wall and inferoseptal wall.   2. Right ventricular systolic function is normal. The right ventricular  size is not well visualized. There is normal pulmonary artery systolic  pressure.   3. The mitral valve is normal in structure. Trivial mitral valve  regurgitation. No evidence of mitral stenosis.   4. The aortic valve is tricuspid. Aortic valve regurgitation is not  visualized. No aortic stenosis is present.   5. The inferior vena cava is dilated in size with <50% respiratory  variability, suggesting right atrial pressure of 15 mmHg.   _____________   Patient Profile     54 y.o. male w/ a h/o HTN, DMII, and gout, who presented 6/21 w/ chest pain and  inferior STEMI.  Assessment & Plan    1.  Acute inferior STEMI/CAD: s/p cath revealing a thrombotic occlusion of the prox/mid RCA.  PTCA was performed and in the setting of severe aneurysmal dzs, late presentation, and L  R collaterals, decision was made to treat medically  aggrastat.  Post-cath, he has c/o persistent, low level (3/10) c/p that worsened this AM in the setting of development of Afib w/ RVR.  F/u ECG this AM w/o acute ST/T changes.  Amio added for afib.  As he hasn't broken yet, will add heparin.  BP stable and will add low-dose metoprolol.  Cont asa, brilinta, and high potency statin.  Eventual cardiac rehab.  2.  Acute HF w/ reduced EF; ICM: EF 35-45% by LV gram, 50-55% by echo yesterday.  No dyspnea.  Body habitus makes exam somewhat challenging but overall appears euvolemic.  Up to this point, we've avoided  blocker/acei/arb 2/2 soft bp's and concern for RV infarct.  BP better this AM and in the setting of Afib, I have added low-dose metoprolol.  If tolerated, can consolidate to long-acting metoprolol or carvedilol prior to discharge.   3.  Afib w/ RVR: Developed just after 7am this morning.  Amio gtt started. Rates improved to 90-110 following amio bolus.  Adding low-dose  blocker as above.  CHA2DS2VASc = 4.  Adding heparin now.  May need DCCV tomorrow morning if he does not convert today.  4.  Essential HTN: BPs have been soft.  Currently stable.  Follow w/ addition of  blocker.  5.  HL:  Cont high potency statin rx.  LDL 133 (statin naive).  6. NSVT: brief run this AM in setting of AFib and c/p.  Amio and  blocker as above.  7.  DMII: A1c 7.0 - per IM.    Signed, Nicolasa Ducking, NP  03/02/2020, 7:44 AM    For questions or updates, please contact   Please consult [email or web address] for contact info under Cardiology/STEMI.

## 2020-03-03 LAB — CBC
HCT: 33.9 % — ABNORMAL LOW (ref 39.0–52.0)
Hemoglobin: 11.9 g/dL — ABNORMAL LOW (ref 13.0–17.0)
MCH: 29 pg (ref 26.0–34.0)
MCHC: 35.1 g/dL (ref 30.0–36.0)
MCV: 82.7 fL (ref 80.0–100.0)
Platelets: 191 10*3/uL (ref 150–400)
RBC: 4.1 MIL/uL — ABNORMAL LOW (ref 4.22–5.81)
RDW: 13.6 % (ref 11.5–15.5)
WBC: 6.7 10*3/uL (ref 4.0–10.5)
nRBC: 0 % (ref 0.0–0.2)

## 2020-03-03 LAB — COMPREHENSIVE METABOLIC PANEL
ALT: 58 U/L — ABNORMAL HIGH (ref 0–44)
AST: 58 U/L — ABNORMAL HIGH (ref 15–41)
Albumin: 3.3 g/dL — ABNORMAL LOW (ref 3.5–5.0)
Alkaline Phosphatase: 90 U/L (ref 38–126)
Anion gap: 10 (ref 5–15)
BUN: 16 mg/dL (ref 6–20)
CO2: 26 mmol/L (ref 22–32)
Calcium: 8.3 mg/dL — ABNORMAL LOW (ref 8.9–10.3)
Chloride: 97 mmol/L — ABNORMAL LOW (ref 98–111)
Creatinine, Ser: 1.15 mg/dL (ref 0.61–1.24)
GFR calc Af Amer: >60 mL/min (ref >60)
GFR calc non Af Amer: >60 mL/min (ref >60)
Glucose, Bld: 176 mg/dL — ABNORMAL HIGH (ref 70–99)
Potassium: 3.6 mmol/L (ref 3.5–5.1)
Sodium: 133 mmol/L — ABNORMAL LOW (ref 135–145)
Total Bilirubin: 2.8 mg/dL — ABNORMAL HIGH (ref 0.3–1.2)
Total Protein: 6.8 g/dL (ref 6.5–8.1)

## 2020-03-03 LAB — GLUCOSE, CAPILLARY
Glucose-Capillary: 137 mg/dL — ABNORMAL HIGH (ref 70–99)
Glucose-Capillary: 106 mg/dL — ABNORMAL HIGH (ref 70–99)
Glucose-Capillary: 118 mg/dL — ABNORMAL HIGH (ref 70–99)
Glucose-Capillary: 116 mg/dL — ABNORMAL HIGH (ref 70–99)

## 2020-03-03 LAB — HEPARIN LEVEL (UNFRACTIONATED)
Heparin Unfractionated: 0.38 IU/mL (ref 0.30–0.70)
Heparin Unfractionated: >3.6 IU/mL — ABNORMAL HIGH (ref 0.30–0.70)

## 2020-03-03 MED ORDER — SODIUM CHLORIDE 0.9 % IV SOLN: INTRAVENOUS | Status: DC

## 2020-03-03 MED ORDER — LOPERAMIDE HCL 2 MG PO CAPS
2.0000 mg | ORAL_CAPSULE | ORAL | Status: DC | PRN
  Administered 2020-03-03 – 2020-03-04 (×4): 2 mg via ORAL
  Filled 2020-03-03 (×4): qty 1

## 2020-03-03 NOTE — Progress Notes (Signed)
Patient remains stable with NSR and infusing 73ml/hr NS. He has had multiple episodes of diarrhea.  Patient delivered imodium.  Patient wife visited earlier today and visited with cardiologist.  He consumed about 40% of his meals.  He is AOx4 and ambulatory.  No cardiac episodes.

## 2020-03-03 NOTE — Progress Notes (Signed)
ANTICOAGULATION CONSULT NOTE  Pharmacy Consult for heparin Indication: atrial fibrillation  Allergies  Allergen Reactions  . Lisinopril Swelling and Cough    Patient Measurements: Height: 5\' 11"  (180.3 cm) Weight: 120.6 kg (265 lb 14 oz) IBW/kg (Calculated) : 75.3 Heparin Dosing Weight: 102 kg  Vital Signs: Temp: 98 F (36.7 C) (06/23 1600) Temp Source: Oral (06/23 1600) BP: 119/79 (06/24 0205) Pulse Rate: 67 (06/24 0205)  Labs: Recent Labs    02/29/20 2215 02/29/20 2251 02/29/20 2355 03/01/20 0202 03/01/20 0409 03/01/20 0409 03/02/20 0423 03/02/20 1715 03/03/20 0155  HGB   < >  --   --   --  13.5   < > 11.4*  --  11.9*  HCT   < >  --   --   --  40.8  --  34.2*  --  33.9*  PLT   < >  --   --   --  162  --  156  --  191  LABPROT  --  13.9  --   --   --   --   --   --   --   INR  --  1.1  --   --   --   --   --   --   --   HEPARINUNFRC  --   --   --   --   --   --   --  <0.10* 0.38  CREATININE   < >  --   --   --  0.94  --  0.98  --  1.15  TROPONINIHS   < >  --  03/05/20* 19,065* 17,858*  --   --   --   --    < > = values in this interval not displayed.    Estimated Creatinine Clearance: 99.3 mL/min (by C-G formula based on SCr of 1.15 mg/dL).   Medical History: Past Medical History:  Diagnosis Date  . 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 Gout   . Hypertension      Assessment: 54 year old male presented with chest pain and inferior STEMI. S/p cardiac cath 6/22 showing occluded RCA. 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 Patient received Aggrastat and was started on DAPT. Patient went into afib w/ RVR 6/23 morning and was placed on amiodarone drip and beta blocker. Pharmacy consulted to start heparin drip.     Patient was started on heparin drip with 3000 unit bolus followed by a drip rate of 1400 units/hr.   7/23 1715 HL < 0.10  -subtherapeutic  Goal of Therapy:  Heparin level 0.3-0.7 units/ml Monitor platelets by  anticoagulation protocol: Yes   Plan:  Will bolus another 3000 units and increase rate to 1750 units/hr  Will recheck HL in 6 hours per protocol and check CBC daily  6/24: HL @ 0155 = 0.38 Will continue this pt on current rate and draw confirmation level in 6 hrs on 6/24 @ 0800.   7/24, PharmD Clinical Pharmacist 03/03/2020 3:23 AM

## 2020-03-03 NOTE — Progress Notes (Signed)
Progress Note  Patient Name: Joshua Holder Date of Encounter: 03/03/2020  Primary Cardiologist: Kathlyn Sacramento, MD  Subjective   Converted to sinus yesterday afternoon (13:10) and c/p has since resolved.  Around 10pm, after several days of constipation, he began to have diarrhea and says he's had an episode about once an hour since then.  Appetite has also been poor.  In the setting of diarrhea, didn't sleep well - tired this AM.  Inpatient Medications    Scheduled Meds: . allopurinol  100 mg Oral Daily  . aspirin  81 mg Oral Daily  . atorvastatin  80 mg Oral Daily  . Chlorhexidine Gluconate Cloth  6 each Topical Q0600  . colchicine  0.6 mg Oral BID  . insulin aspart  0-5 Units Subcutaneous QHS  . insulin aspart  0-9 Units Subcutaneous TID WC  . metoprolol tartrate  12.5 mg Oral BID  . mupirocin ointment  1 application Nasal BID  . sodium chloride flush  3 mL Intravenous Q12H  . tamsulosin  0.4 mg Oral Daily  . ticagrelor  90 mg Oral BID   Continuous Infusions: . sodium chloride    . amiodarone 30 mg/hr (03/03/20 0542)  . heparin 1,750 Units/hr (03/03/20 0400)   PRN Meds: sodium chloride, acetaminophen, morphine injection, ondansetron (ZOFRAN) IV, sodium chloride flush   Vital Signs    Vitals:   03/03/20 0205 03/03/20 0305 03/03/20 0400 03/03/20 0636  BP: 119/79 (!) 132/92 123/90 129/88  Pulse: 67 65 (!) 54 64  Resp: (!) 30 18 19  (!) 22  Temp:      TempSrc:      SpO2: 97% 98% 99% 98%  Weight:      Height:        Intake/Output Summary (Last 24 hours) at 03/03/2020 0723 Last data filed at 03/03/2020 0400 Gross per 24 hour  Intake 737.17 ml  Output 400 ml  Net 337.17 ml   Filed Weights   02/29/20 2203 03/01/20 0135  Weight: 127 kg 120.6 kg    Physical Exam   GEN: Well nourished, well developed, in no acute distress.  HEENT: Grossly normal.  Neck: Supple, difficult to gauge jvp 2/2 body habitus.  No carotid bruits, or masses. Cardiac: RRR, distant, no  murmurs, rubs, or gallops. No clubbing, cyanosis, edema.  Radials/DP/PT 2+ and equal bilaterally. R radial cath site w/o bleeding/bruit/hematoma. Respiratory:  Respirations regular and unlabored, clear to auscultation bilaterally. GI: Soft, nontender, nondistended, BS + x 4. MS: no deformity or atrophy. Skin: warm and dry, no rash. Neuro:  Strength and sensation are intact. Psych: AAOx3.  Normal affect.  Labs    Chemistry Recent Labs  Lab 03/01/20 0409 03/02/20 0423 03/03/20 0155  NA 136 134* 133*  K 3.6 3.6 3.6  CL 103 99 97*  CO2 21* 26 26  GLUCOSE 127* 155* 176*  BUN 13 16 16   CREATININE 0.94 0.98 1.15  CALCIUM 8.3* 8.3* 8.3*  PROT  --   --  6.8  ALBUMIN  --   --  3.3*  AST  --   --  58*  ALT  --   --  58*  ALKPHOS  --   --  90  BILITOT  --   --  2.8*  GFRNONAA >60 >60 >60  GFRAA >60 >60 >60  ANIONGAP 12 9 10      Hematology Recent Labs  Lab 03/01/20 0409 03/02/20 0423 03/03/20 0155  WBC 8.2 6.7 6.7  RBC 4.73 3.95* 4.10*  HGB  13.5 11.4* 11.9*  HCT 40.8 34.2* 33.9*  MCV 86.3 86.6 82.7  MCH 28.5 28.9 29.0  MCHC 33.1 33.3 35.1  RDW 13.8 13.6 13.6  PLT 162 156 191    Cardiac Enzymes  Recent Labs  Lab 02/29/20 2215 02/29/20 2355 03/01/20 0202 03/01/20 0409  TROPONINIHS 20,077* 21,388* 19,065* 17,858*      Radiology    DG Chest 2 View  Result Date: 02/29/2020 CLINICAL DATA:  Worsening fatigue with intermittent cough. EXAM: CHEST - 2 VIEW COMPARISON:  None. FINDINGS: There is no evidence of acute infiltrate, pleural effusion or pneumothorax. The heart size and mediastinal contours are within normal limits. The visualized skeletal structures are unremarkable. IMPRESSION: No active cardiopulmonary disease. Electronically Signed   By: Aram Candela M.D.   On: 02/29/2020 22:30   Telemetry    Converted from afib to sinus rhythm  @ 13:10 on 6/22. Maintaining sinus rhythm since.  No significant ectopy. - Personally Reviewed  Cardiac Studies   Cardiac  Catheterization & PTCA 6.22.2021        Left Main  The vessel exhibits minimal luminal irregularities.  Left Anterior Descending      Mid LAD lesion is 40% stenosed.      First Diagonal Branch  Vessel is large in size. The vessel exhibits minimal luminal irregularities.  Left Circumflex     Vessel is angiographically normal.     Right Coronary Artery  Prox RCA to Mid RCA lesion is 100% stenosed. Vessel is the culprit lesion. The lesion is type C and heavily thrombotic with left-to-right collateral flow. The lesion was not previously treated.  Right Posterior Descending Artery    Collaterals    RPDA filled by collaterals from Dist LAD.        Second Right Posterolateral Branch   Collaterals   2nd RPL filled by collaterals from Dist LAD.    Multiple inflations were performed to the proximal and mid right coronary artery. This established TIMI I flow only and at this point it was clear that the proximal RCA was aneurysmal with large amount of thrombus. Given late presentation as well as the presence of left-to-right collaterals, I felt that the risks of continued PCI outweigh the benefits considering that there is high risk for distal embolization, mechanical complications and no reflow.  fraction is 35-45%.  _____________  2D Echocardiogram 6.21.2021  1. Left ventricular ejection fraction, by estimation, is 50 to 55%. The  left ventricle has low normal function. The left ventricle demonstrates  regional wall motion abnormalities (see scoring diagram/findings for  description). There is mild left  ventricular hypertrophy. Left ventricular diastolic parameters were  normal. There is severe hypokinesis of the left ventricular, basal-mid  inferior wall and inferoseptal wall.  2. Right ventricular systolic function is normal. The right ventricular  size is not well visualized. There is normal pulmonary artery systolic  pressure.  3. The mitral valve is normal in structure.  Trivial mitral valve  regurgitation. No evidence of mitral stenosis.  4. The aortic valve is tricuspid. Aortic valve regurgitation is not  visualized. No aortic stenosis is present.  5. The inferior vena cava is dilated in size with <50% respiratory  variability, suggesting right atrial pressure of 15 mmHg.   _____________   Patient Profile     54 y.o. male w/ a h/o HTN, DMII, and gout, who presented 6/21 w/ chest pain and inferior STEMI  s/p PTCA of RCA in setting of aneurysmal dzs.  Developed afib early  6/22  converted on IV amio.  Assessment & Plan    1.  Acute inferior STEMI/CAD:  s/p cath revealing a thrombotic occlusion of the prox/mid RCA.  PTCA was performed and in the setting of severe aneurysmal dzs, late presentation, and L  R collaterals, decision was made to treat medically  aggrastat.  Post-cath had persistent 3/10 c/p that worsened on the morning of 6/22 in the setting of development of Afib RVR. No new ST changes on ECG.  Afib converted to sinus @ 13:10 6/22.  C/p resolved after conversion and he has had no recurrence.  Cont asa, brilinta, and high potency statin rx. Eventual cardiac rehab.  Ok to tx to floor today.  2.  Acute HF w/ reduce EF; ICM: EF 25-35% by LV gram, 50-55% by echo yesterday. Body habitus makes exam challenging but overall appear euvolemic.   blocker added yesterday. BP previously too soft for acei/arb. Creat up slightly this AM in setting of diarrhea and poor PO intake, so will cont to hold off on acei/arb.   3.  Afib w/ RVR:  Developed 6/22 @ 7:06 AM.  Robbi Garter 6/22 @ 13:10 on IV amio. Will d/c amio. Cont  blocker.  He has been anticoagulated w/ heparin since onset of Afib. Will d/c.  CHA2DS2VASc = 4.  Given need for DAPT and short-lived afib, will hold off on [facility] @ this time.  I question role of IV amio in development of diarrhea last night.   4.  Essential HTN:  BP prev soft, currently stable.  Cont  blocker.  5.  HL:  LDL 133.  Cont high potency  statin rx.  6.  NSVT:  Brief run during afib yesterday morning.  No recurrence.  Will d/c amio.  Cont  blocker.  7.  DMII:  A1c 7.0.  Per IM.  8.  Diarrhea:  Multiple loose stools overnight.  Creat up slightly.  Will gently hydrate.  Imodium ordered.  ? Role of IV amio.  Signed, Nicolasa Ducking, NP  03/03/2020, 7:23 AM    For questions or updates, please contact   Please consult [email or web address] for contact info under Cardiology/STEMI.

## 2020-03-03 NOTE — Progress Notes (Signed)
PROGRESS NOTE    Joshua Holder  ZSW:[id number] DOB: Sep 09, 1966 DOA: 02/29/2020 PCP: System, Provider Not In    Brief Narrative:  54 year old gentleman with history of type 2 diabetes on Metformin, gout and hypertension presented to the emergency room on 02/29/2020 with 1 day of recurrent midsternal chest pain and chest tightness. Has history of premature cardiac disease in family. In the emergency room, respiratory 24. Blood pressure stable. Heart rate 81. Troponin 20,000. Chest x-ray normal. EKG showed inferior ST segment depression. Patient was given aspirin, IV heparin bolus, IV morphine. He had recurrence of chest pain and was taken to cardiac Cath Lab emergently. 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  No stenting done.  Treated with Aggrastat. 6/23, developed acute chest pain, A. fib with RVR treated with amiodarone with conversion to sinus rhythm. 6/24, overnight watery diarrhea.  Chest pain improved.  Converted to sinus rhythm.   Assessment & Plan:   Principal Problem:   Acute ST elevation myocardial infarction (STEMI) of inferior wall (HCC) Active Problems:   DM (diabetes mellitus) ([place])   HTN (hypertension)   Gout   Acute myocardial infarction [facility])   Atrial fibrillation with RVR (HCC)   Ischemic cardiomyopathy  Acute ST elevation MI of inferior wall: Status post cardiac cath for PCI revealing a thrombotic occlusion of the proximal/mid RCA. PTCA was performed, found to have severe aneurysmal disease, late presentation, left to right collaterals and no stenting was done due to risk of rupture. High thrombus load, treated with Aggrastat. Currently remains on aspirin, Brilinta.  Heparin discontinued today. Remains on beta-blockers. Followed by cardiology.  Transferred to progressive care unit today.  Mobilize.  Paroxysmal A. fib with RVR: Transient A. fib with RVR that is treated with  amiodarone with improvement.   Patient has developed diarrhea.  Sinus rhythm now.  On beta-blockers. As per cardiology plan, amiodarone and heparin discontinued. TSH is normal.  Electrolytes are normal.  Ischemic cardiomyopathy, acute heart failure with reduced ejection fraction: Ejection fraction 35-45% on cardiac cath and ventriculogram, echocardiogram with ejection fraction 50-55% on 6/22. Euvolemic. Continue to monitor. Added metoprolol. Not started on ACE secondary to RV infarct.  Type 2 diabetes: On Metformin at home. A1c 7. Well-controlled. Will start patient on sliding scale insulin. Holding Metformin until discharge.  Secretory diarrhea: Probably due to amiodarone.  Use Imodium as needed.   DVT prophylaxis: Heparin infusion   Code Status: Full code Family Communication: wife at the bedside. Disposition Plan: Status is: Inpatient  Remains inpatient appropriate because:Ongoing diagnostic testing needed not appropriate for outpatient work up, IV treatments appropriate due to intensity of illness or inability to take PO and Inpatient level of care appropriate due to severity of illness   Dispo: The patient is from: Home              Anticipated d/c is to: Home              Anticipated d/c date is: 1 day              Patient currently is not medically stable to d/c.    Consultants:   Cardiology  Procedures:   Cardiac cath  Antimicrobials:   None   Subjective: Patient seen and examined in the morning rounds.  Cardiology team was at the bedside.  Wife was at the bedside.  No overnight events except patient had multiple episodes of loose watery stool with some abdominal  cramps.  No fever.  Denies any nausea vomiting.  Objective: Vitals:   03/03/20 0700 03/03/20 0800 03/03/20 1000 03/03/20 1400  BP: 127/90 123/89 (!) 135/102 (!) 130/92  Pulse: (!) 57 63 64 61  Resp: (!) 25 19 (!) 34 (!) 34  Temp:      TempSrc:      SpO2: 98% 97% 97% 98%  Weight:      Height:         Intake/Output Summary (Last 24 hours) at 03/03/2020 1407 Last data filed at 03/03/2020 1200 Gross per 24 hour  Intake 713.59 ml  Output --  Net 713.59 ml   Filed Weights   02/29/20 2203 03/01/20 0135  Weight: 127 kg 120.6 kg    Examination:  General exam: Appears calm and comfortable  Respiratory system: Clear to auscultation. Respiratory effort normal. Cardiovascular system: S1 & S2 heard, RRR.  Gastrointestinal system: Abdomen is nondistended, soft and nontender. No organomegaly or masses felt. Normal bowel sounds heard. Central nervous system: Alert and oriented. No focal neurological deficits. Extremities: Symmetric 5 x 5 power. Right wrist incision site clean and dry. Skin: No rashes, lesions or ulcers Psychiatry: Judgement and insight appear normal. Mood & affect anxious.    Data Reviewed: I have personally reviewed following labs and imaging studies  CBC: Recent Labs  Lab 02/29/20 2215 03/01/20 0409 03/02/20 0423 03/03/20 0155  WBC 8.7 8.2 6.7 6.7  HGB 13.7 13.5 11.4* 11.9*  HCT 40.1 40.8 34.2* 33.9*  MCV 84.4 86.3 86.6 82.7  PLT 153 162 156 191   Basic Metabolic Panel: Recent Labs  Lab 02/29/20 2215 03/01/20 0409 03/02/20 0423 03/02/20 1715 03/03/20 0155  NA 134* 136 134*  --  133*  K 3.4* 3.6 3.6  --  3.6  CL 99 103 99  --  97*  CO2 26 21* 26  --  26  GLUCOSE 166* 127* 155*  --  176*  BUN 14 13 16   --  16  CREATININE 1.07 0.94 0.98  --  1.15  CALCIUM 8.6* 8.3* 8.3*  --  8.3*  MG  --   --   --  1.9  --    GFR: Estimated Creatinine Clearance: 99.3 mL/min (by C-G formula based on SCr of 1.15 mg/dL). Liver Function Tests: Recent Labs  Lab 03/03/20 0155  AST 58*  ALT 58*  ALKPHOS 90  BILITOT 2.8*  PROT 6.8  ALBUMIN 3.3*   No results for input(s): LIPASE, AMYLASE in the last 168 hours. No results for input(s): AMMONIA in the last 168 hours. Coagulation Profile: Recent Labs  Lab 02/29/20 2251  INR 1.1   Cardiac Enzymes: No  results for input(s): CKTOTAL, CKMB, CKMBINDEX, TROPONINI in the last 168 hours. BNP (last 3 results) No results for input(s): PROBNP in the last 8760 hours. HbA1C: Recent Labs    03/01/20 0409  HGBA1C 7.0*   CBG: Recent Labs  Lab 03/01/20 0140 03/02/20 1705 03/02/20 2147 03/03/20 0735 03/03/20 1118  GLUCAP 127* 197* 166* 137* 106*   Lipid Profile: Recent Labs    03/01/20 0409  CHOL 208*  HDL 49  LDLCALC 133*  TRIG 128  CHOLHDL 4.2   Thyroid Function Tests: Recent Labs    03/02/20 1715  TSH 5.222*   Anemia Panel: No results for input(s): VITAMINB12, FOLATE, FERRITIN, TIBC, IRON, RETICCTPCT in the last 72 hours. Sepsis Labs: No results for input(s): PROCALCITON, LATICACIDVEN in the last 168 hours.  Recent Results (from the past 240 hour(s))  SARS Coronavirus 2 by RT PCR (hospital order, performed in [facility] hospital lab) Nasopharyngeal Nasopharyngeal Swab     Status: None   Collection Time: 02/29/20 11:46 PM   Specimen: Nasopharyngeal Swab  Result Value Ref Range Status   SARS Coronavirus 2 NEGATIVE NEGATIVE Final    Comment: (NOTE) SARS-CoV-2 target nucleic acids are NOT DETECTED.  The SARS-CoV-2 RNA is generally detectable in upper and lower respiratory specimens during the acute phase of infection. The lowest concentration of SARS-CoV-2 viral copies this assay can detect is 250 copies / mL. A negative result does not preclude SARS-CoV-2 infection and should not be used as the sole basis for treatment or other patient management decisions.  A negative result may occur with improper specimen collection / handling, submission of specimen other than nasopharyngeal swab, presence of viral mutation(s) within the areas targeted by this assay, and inadequate number of viral copies (<250 copies / mL). A negative result must be combined with clinical observations, patient history, and epidemiological information.  Fact Sheet for Patients:    BoilerBrush.com.cy  Fact Sheet for Healthcare Providers: https://pope.com/  This test is not yet approved or  cleared by the [place] FDA and has been authorized for detection and/or diagnosis of SARS-CoV-2 by FDA under an Emergency Use Authorization (EUA).  This EUA will remain in effect (meaning this test can be used) for the duration of the COVID-19 declaration under Section 564(b)(1) of the Act, 21 U.S.C. section 360bbb-3(b)(1), unless the authorization is terminated or revoked sooner.  Performed at [facility], [address] [place], [place] [address]   MRSA PCR Screening     Status: Abnormal   Collection Time: 03/01/20  1:36 AM   Specimen: Nasal Mucosa; Nasopharyngeal  Result Value Ref Range Status   MRSA by PCR POSITIVE (A) NEGATIVE Final    Comment:        The GeneXpert MRSA Assay (FDA approved for NASAL specimens only), is one component of a comprehensive MRSA colonization surveillance program. It is not intended to diagnose MRSA infection nor to guide or monitor treatment for MRSA infections. RESULT CALLED TO, READ BACK BY AND VERIFIED WITH: ALEKSEY FRASER @0307  ON 03/01/20 SKL Performed at [facility], [address] [place], [place]          Radiology Studies: No results found.      Scheduled Meds: . allopurinol  100 mg Oral Daily  . aspirin  81 mg Oral Daily  . atorvastatin  80 mg Oral Daily  . Chlorhexidine Gluconate Cloth  6 each Topical Q0600  . colchicine  0.6 mg Oral BID  . insulin aspart  0-5 Units Subcutaneous QHS  . insulin aspart  0-9 Units Subcutaneous TID WC  . metoprolol tartrate  12.5 mg Oral BID  . mupirocin ointment  1 application Nasal BID  . sodium chloride flush  3 mL Intravenous Q12H  . tamsulosin  0.4 mg Oral Daily  . ticagrelor  90 mg Oral BID   Continuous Infusions: . sodium chloride    . sodium chloride 50 mL/hr at 03/03/20 1200      LOS: 3 days    Time spent: 30 minutes    03/05/20, MD [facility] Pager [phone]

## 2020-03-04 ENCOUNTER — Telehealth: Payer: Self-pay

## 2020-03-04 LAB — COMPREHENSIVE METABOLIC PANEL
ALT: 46 U/L — ABNORMAL HIGH (ref 0–44)
AST: 46 U/L — ABNORMAL HIGH (ref 15–41)
Albumin: 3.2 g/dL — ABNORMAL LOW (ref 3.5–5.0)
Alkaline Phosphatase: 83 U/L (ref 38–126)
Anion gap: 11 (ref 5–15)
BUN: 17 mg/dL (ref 6–20)
CO2: 23 mmol/L (ref 22–32)
Calcium: 8.4 mg/dL — ABNORMAL LOW (ref 8.9–10.3)
Chloride: 103 mmol/L (ref 98–111)
Creatinine, Ser: 1.15 mg/dL (ref 0.61–1.24)
GFR calc Af Amer: >60 mL/min (ref >60)
GFR calc non Af Amer: >60 mL/min (ref >60)
Glucose, Bld: 105 mg/dL — ABNORMAL HIGH (ref 70–99)
Potassium: 3.4 mmol/L — ABNORMAL LOW (ref 3.5–5.1)
Sodium: 137 mmol/L (ref 135–145)
Total Bilirubin: 2.4 mg/dL — ABNORMAL HIGH (ref 0.3–1.2)
Total Protein: 6.9 g/dL (ref 6.5–8.1)

## 2020-03-04 LAB — GLUCOSE, CAPILLARY
Glucose-Capillary: 98 mg/dL (ref 70–99)
Glucose-Capillary: 165 mg/dL — ABNORMAL HIGH (ref 70–99)

## 2020-03-04 MED ORDER — TICAGRELOR 90 MG PO TABS: 90.0000 mg | ORAL_TABLET | Freq: Two times a day (BID) | ORAL | 0 refills | Status: DC

## 2020-03-04 MED ORDER — METOPROLOL TARTRATE 25 MG PO TABS: 12.5000 mg | ORAL_TABLET | Freq: Two times a day (BID) | ORAL | 0 refills | Status: DC

## 2020-03-04 MED ORDER — POTASSIUM CHLORIDE CRYS ER 20 MEQ PO TBCR: 20.0000 meq | EXTENDED_RELEASE_TABLET | Freq: Every day | ORAL | 0 refills | Status: DC

## 2020-03-04 MED ORDER — ATORVASTATIN CALCIUM 80 MG PO TABS: 80.0000 mg | ORAL_TABLET | Freq: Every day | ORAL | 0 refills | Status: DC

## 2020-03-04 MED ORDER — ASPIRIN 81 MG PO CHEW: 81.0000 mg | CHEWABLE_TABLET | Freq: Every day | ORAL | 0 refills | Status: AC

## 2020-03-04 MED ORDER — POTASSIUM CHLORIDE CRYS ER 20 MEQ PO TBCR
40.0000 meq | EXTENDED_RELEASE_TABLET | Freq: Two times a day (BID) | ORAL | Status: DC
  Administered 2020-03-04: 40 meq via ORAL
  Filled 2020-03-04: qty 2

## 2020-03-04 NOTE — Discharge Summary (Signed)
Physician Discharge Summary  Trenden Hazelrigg ZOX:[id number] DOB: [birth year] DOA: 02/29/2020  PCP: System, Provider Not In  Admit date: 02/29/2020 Discharge date: 03/04/2020  Admitted From: Home Disposition: Home  Recommendations for Outpatient Follow-up:  1. Follow up with PCP in 1-2 weeks 2. Follow-up with cardiology, office will schedule follow-up.    Discharge Condition: Stable CODE STATUS: Full code Diet recommendation: Low-salt low-carb diet  Discharge summary: 54 year old gentleman with history of type 2 diabetes on Metformin, gout and hypertension presented to the emergency room on 02/29/2020 with 1 day of recurrent midsternal chest pain and chest tightness. Has history of premature cardiac disease in family.  In the emergency room, respiratory 24. Blood pressure stable. Heart rate 81. Troponin 20,000. Chest x-ray normal. EKG showed inferior ST segment depression. Patient was given aspirin, IV heparin bolus, IV morphine. He had recurrence of chest pain and was taken to cardiac Cath Lab emergently. 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  No stenting done.  Treated with Aggrastat. 6/23, developed acute chest pain, A. fib with RVR treated with amiodarone with conversion to sinus rhythm. 6/24-25, developed some watery diarrhea.  Chest pain improved.  Converted to sinus rhythm.  Assessment and plan: Acute ST elevation MI of inferior wall, transient paroxysmal A. fib with RVR converted to sinus rhythm, ischemic cardiomyopathy: Adequately clinically improved. Treated with Aggrastat. Currently on aspirin, Brilinta, beta-blockers and high-dose statin.  Euvolemic. Electrolytes being replaced. Angioedema from lisinopril, will defer starting any ARB.  Patient might have been on ARB, however he could not find the records.  Will be followed up by cardiology and discussed. Hemoglobin A1c is 7, he will  resume his Metformin. Patient developed some secretory diarrhea that has been improving since last 12 hours, he can use Imodium as needed. He does have severe sleep apnea, he will use home CPAP.  Patient is stable to be discharged home today.  Cardiology follow-up plan, cardiac rehab plan in place.  Discharge Diagnoses:  Principal Problem:   Acute ST elevation myocardial infarction (STEMI) of inferior wall (HCC) Active Problems:   DM (diabetes mellitus) (HCC)   HTN (hypertension)   Gout   Acute myocardial infarction [facility])   Atrial fibrillation with RVR (HCC)   Ischemic cardiomyopathy    Discharge Instructions  Discharge Instructions    AMB Referral to Cardiac Rehabilitation - Phase II   Complete by: As directed    Diagnosis:  PTCA STEMI     After initial evaluation and assessments completed: Virtual Based Care may be provided alone or in conjunction with Phase 2 Cardiac Rehab based on patient barriers.: Yes   Diet - low sodium heart healthy   Complete by: As directed    Diet Carb Modified   Complete by: As directed    Discharge instructions   Complete by: As directed    No exertion for 2 weeks  Take imodium as needed for diarrhea   Increase activity slowly   Complete by: As directed      Allergies as of 03/04/2020      Reactions   Lisinopril Swelling, Cough      Medication List    STOP taking these medications   allopurinol 100 MG tablet Commonly known as: ZYLOPRIM   telmisartan-hydrochlorothiazide 40-12.5 MG tablet Commonly known as: MICARDIS HCT     TAKE these medications   aspirin 81 MG chewable tablet Chew 1 tablet (81 mg total) by mouth daily. Start taking on:  March 05, 2020   atorvastatin 80 MG tablet Commonly known as: LIPITOR Take 1 tablet (80 mg total) by mouth daily. Start taking on: March 05, 2020   metFORMIN 500 MG 24 hr tablet Commonly known as: GLUCOPHAGE-XR Take 1,000 mg by mouth daily with breakfast.   metoprolol tartrate 25 MG  tablet Commonly known as: LOPRESSOR Take 0.5 tablets (12.5 mg total) by mouth 2 (two) times daily.   potassium chloride SA 20 MEQ tablet Commonly known as: KLOR-CON Take 1 tablet (20 mEq total) by mouth daily for 7 days.   predniSONE 20 MG tablet Commonly known as: DELTASONE prednisone 20 mg tablet   Tadalafil 2.5 MG Tabs Take 1 tablet by mouth daily.   tamsulosin 0.4 MG Caps capsule Commonly known as: FLOMAX Take 0.4 mg by mouth daily.   ticagrelor 90 MG Tabs tablet Commonly known as: BRILINTA Take 1 tablet (90 mg total) by mouth 2 (two) times daily.       Allergies  Allergen Reactions  . Lisinopril Swelling and Cough    Consultations:  Cardiology   Procedures/Studies: DG Chest 2 View  Result Date: 02/29/2020 CLINICAL DATA:  Worsening fatigue with intermittent cough. EXAM: CHEST - 2 VIEW COMPARISON:  None. FINDINGS: There is no evidence of acute infiltrate, pleural effusion or pneumothorax. The heart size and mediastinal contours are within normal limits. The visualized skeletal structures are unremarkable. IMPRESSION: No active cardiopulmonary disease. Electronically Signed   By: Aram Candela M.D.   On: 02/29/2020 22:30   CARDIAC CATHETERIZATION  Result Date: 03/01/2020  LV end diastolic pressure is moderately elevated.  The left ventricular ejection fraction is 35-45% by visual estimate.  There is moderate left ventricular systolic dysfunction.  Prox RCA to Mid RCA lesion is 100% stenosed.  Post intervention, there is a 100% residual stenosis.  Balloon angioplasty was performed using a BALLOON TREK RX 2.5X15.  Mid LAD lesion is 40% stenosed.  1.  Severe one-vessel coronary artery disease with thrombotic occlusion of the proximal right coronary artery with reasonable left to right collaterals from the LAD.  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 2.  Moderately reduced LV systolic function with an EF of 35 to 40%  with inferior wall akinesis. 3.  Moderately elevated left ventricular end-diastolic pressure at 27 mmHg. 4.  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  I felt that continued intervention would carry a significant risk of no reflow due to distal embolization and that would risk the already developed collaterals. Recommendations: This was a case of inferior ST elevation myocardial infarction with late presentation after more than 24 hours of symptoms.  Initial plan was to treat him medically and consider angiography in the morning but then his chest pain worsened with diaphoresis and I elected to proceed with emergent cardiac catheterization with the above results. The patient was started on Aggrastat to be continued for 18 hours. I started the patient on oral dual antiplatelet therapy with aspirin and ticagrelor. If the patient remains clinically stable, no plans for repeat angiography and the plan is to treat him medically assuming that his left-to-right collaterals will continue to develop. If in the other hand he has recurrent symptoms or instability, relook angiography can be considered after the patient has been treated with intravenous and oral antiplatelet medications. The patient is at high risk for mechanical complications and  right ventricular failure.   ECHOCARDIOGRAM COMPLETE  Result Date: 03/01/2020    ECHOCARDIOGRAM REPORT   Patient Name:   ALIREZA POLLACK Date of Exam: 03/01/2020 Medical Rec #:  [id number]       Height:       71.0 in Accession #:    [id number]      Weight:       265.9 lb Date of Birth:  February 23, 1966      BSA:          2.380 m Patient Age:    53 years        BP:           107/81 mmHg Patient Gender: M               HR:           88 bpm. Exam Location:  [facility] Procedure: 2D Echo, Color Doppler, Cardiac Doppler and Intracardiac            Opacification Agent Indications:      Acute myocardial infarction 410  History:         Patient has no prior history of Echocardiogram examinations.                  Risk Factors:Hypertension and Diabetes.  Sonographer:     Humphrey Rolls RDCS (AE) Referring Phys:  [id number] [facility] A ARIDA Diagnosing Phys: Cristal Deer End MD  Sonographer Comments: Suboptimal apical window and suboptimal subcostal window. Image acquisition challenging due to patient body habitus. IMPRESSIONS  1. Left ventricular ejection fraction, by estimation, is 50 to 55%. The left ventricle has low normal function. The left ventricle demonstrates regional wall motion abnormalities (see scoring diagram/findings for description). There is mild left ventricular hypertrophy. Left ventricular diastolic parameters were normal. There is severe hypokinesis of the left ventricular, basal-mid inferior wall and inferoseptal wall.  2. Right ventricular systolic function is normal. The right ventricular size is not well visualized. There is normal pulmonary artery systolic pressure.  3. The mitral valve is normal in structure. Trivial mitral valve regurgitation. No evidence of mitral stenosis.  4. The aortic valve is tricuspid. Aortic valve regurgitation is not visualized. No aortic stenosis is present.  5. The inferior vena cava is dilated in size with <50% respiratory variability, suggesting right atrial pressure of 15 mmHg. FINDINGS  Left Ventricle: Left ventricular ejection fraction, by estimation, is 50 to 55%. The left ventricle has low normal function. The left ventricle demonstrates regional wall motion abnormalities. Severe hypokinesis of the left ventricular, basal-mid inferior wall and inferoseptal wall. Definity contrast agent was given IV to delineate the left ventricular endocardial borders. The left ventricular internal cavity size was normal in size. There is mild left ventricular hypertrophy. Left ventricular diastolic parameters were normal. Right Ventricle: The right ventricular size  is not well visualized. No increase in right ventricular wall thickness. Right ventricular systolic function is normal. There is normal pulmonary artery systolic pressure. The tricuspid regurgitant velocity is 1.98 m/s, and with an assumed right atrial pressure of 15 mmHg, the estimated right ventricular systolic pressure is 30.7 mmHg. Left Atrium: Left atrial size was normal in size. Right Atrium: Right atrial size was normal in size. Pericardium: There is no evidence of pericardial effusion. Mitral Valve: The mitral valve is normal in structure. Trivial mitral valve regurgitation. No evidence of mitral valve stenosis. MV peak gradient, 2.6 mmHg. The mean mitral valve gradient is 1.0 mmHg. Tricuspid Valve: The tricuspid valve is grossly normal.  Tricuspid valve regurgitation is trivial. Aortic Valve: The aortic valve is tricuspid. Aortic valve regurgitation is not visualized. No aortic stenosis is present. Aortic valve mean gradient measures 4.0 mmHg. Aortic valve peak gradient measures 8.8 mmHg. Aortic valve area, by VTI measures 4.95 cm. Pulmonic Valve: The pulmonic valve was not well visualized. Pulmonic valve regurgitation is not visualized. No evidence of pulmonic stenosis. Aorta: The aortic root is normal in size and structure. Pulmonary Artery: The pulmonary artery is of normal size. Venous: The inferior vena cava is dilated in size with less than 50% respiratory variability, suggesting right atrial pressure of 15 mmHg. IAS/Shunts: The interatrial septum was not well visualized.  LEFT VENTRICLE PLAX 2D LVIDd:         4.89 cm  Diastology LVIDs:         3.81 cm  LV e' lateral:   12.10 cm/s LV PW:         1.18 cm  LV E/e' lateral: 6.3 LV IVS:        1.07 cm  LV e' medial:    8.38 cm/s LVOT diam:     2.80 cm  LV E/e' medial:  9.2 LV SV:         86 LV SV Index:   36 LVOT Area:     6.16 cm  LEFT ATRIUM             Index       RIGHT ATRIUM           Index LA diam:        2.90 cm 1.22 cm/m  RA Area:     16.18 cm  6.80 cm/m LA Vol (A2C):   45.1 ml 18.95 ml/m LA Vol (A4C):   42.6 ml 17.90 ml/m LA Biplane Vol: 46.7 ml 19.62 ml/m  AORTIC VALVE                   PULMONIC VALVE AV Area (Vmax):    3.72 cm    PV Vmax:       1.18 m/s AV Area (Vmean):   4.73 cm    PV Vmean:      84.600 cm/s AV Area (VTI):     4.95 cm    PV VTI:        0.205 m AV Vmax:           148.00 cm/s PV Peak grad:  5.6 mmHg AV Vmean:          87.000 cm/s PV Mean grad:  3.0 mmHg AV VTI:            0.174 m AV Peak Grad:      8.8 mmHg AV Mean Grad:      4.0 mmHg LVOT Vmax:         89.40 cm/s LVOT Vmean:        66.800 cm/s LVOT VTI:          0.140 m LVOT/AV VTI ratio: 0.80  AORTA Ao Root diam: 3.20 cm MITRAL VALVE               TRICUSPID VALVE MV Area (PHT): 3.93 cm    TR Peak grad:   15.7 mmHg MV Peak grad:  2.6 mmHg    TR Vmax:        198.00 cm/s MV Mean grad:  1.0 mmHg MV Vmax:       0.80 m/s    SHUNTS MV Vmean:      55.8  cm/s   Systemic VTI:  0.14 m MV Decel Time: 193 msec    Systemic Diam: 2.80 cm MV E velocity: 76.70 cm/s MV A velocity: 54.40 cm/s MV E/A ratio:  1.41 Cristal Deer End MD Electronically signed by Yvonne Kendall MD Signature Date/Time: 03/01/2020/10:11:39 AM    Final     (Echo, Carotid, EGD, Colonoscopy, ERCP)    Subjective: Patient seen and examined.  No overnight events.  Had 2-3 episodes of diarrhea overnight, no diarrhea since morning for the last 8 hours.  Denies any chest pain.  Telemetry with sinus rhythm.  Ready to get home.  Wife at the bedside.   Discharge Exam: Vitals:   03/04/20 0028 03/04/20 0800  BP: 130/85 132/86  Pulse:    Resp: 16   Temp: 97.6 F (36.4 C) 97.9 F (36.6 C)  SpO2: 99%    Vitals:   03/03/20 1800 03/03/20 2000 03/04/20 0028 03/04/20 0800  BP: (!) 142/117 121/90 130/85 132/86  Pulse: 73 61    Resp: (!) 21 (!) 28 16   Temp:   97.6 F (36.4 C) 97.9 F (36.6 C)  TempSrc:   Oral Oral  SpO2: 95% 100% 99%   Weight:      Height:        General: Pt is alert, awake, not in acute  distress Cardiovascular: RRR, S1/S2 +, no rubs, no gallops Respiratory: CTA bilaterally, no wheezing, no rhonchi Abdominal: Soft, NT, ND, bowel sounds + Extremities: no edema, no cyanosis    The results of significant diagnostics from this hospitalization (including imaging, microbiology, ancillary and laboratory) are listed below for reference.     Microbiology: Recent Results (from the past 240 hour(s))  SARS Coronavirus 2 by RT PCR (hospital order, performed in [facility] hospital lab) Nasopharyngeal Nasopharyngeal Swab     Status: None   Collection Time: 02/29/20 11:46 PM   Specimen: Nasopharyngeal Swab  Result Value Ref Range Status   SARS Coronavirus 2 NEGATIVE NEGATIVE Final    Comment: (NOTE) SARS-CoV-2 target nucleic acids are NOT DETECTED.  The SARS-CoV-2 RNA is generally detectable in upper and lower respiratory specimens during the acute phase of infection. The lowest concentration of SARS-CoV-2 viral copies this assay can detect is 250 copies / mL. A negative result does not preclude SARS-CoV-2 infection and should not be used as the sole basis for treatment or other patient management decisions.  A negative result may occur with improper specimen collection / handling, submission of specimen other than nasopharyngeal swab, presence of viral mutation(s) within the areas targeted by this assay, and inadequate number of viral copies (<250 copies / mL). A negative result must be combined with clinical observations, patient history, and epidemiological information.  Fact Sheet for Patients:   BoilerBrush.com.cy  Fact Sheet for Healthcare Providers: https://pope.com/  This test is not yet approved or  cleared by the [place] FDA and has been authorized for detection and/or diagnosis of SARS-CoV-2 by FDA under an Emergency Use Authorization (EUA).  This EUA will remain in effect (meaning this test can be used) for  the duration of the COVID-19 declaration under Section 564(b)(1) of the Act, 21 U.S.C. section 360bbb-3(b)(1), unless the authorization is terminated or revoked sooner.  Performed at [facility], [address] [place], [place] [address]   MRSA PCR Screening     Status: Abnormal   Collection Time: 03/01/20  1:36 AM   Specimen: Nasal Mucosa; Nasopharyngeal  Result Value Ref Range Status   MRSA by  PCR POSITIVE (A) NEGATIVE Final    Comment:        The GeneXpert MRSA Assay (FDA approved for NASAL specimens only), is one component of a comprehensive MRSA colonization surveillance program. It is not intended to diagnose MRSA infection nor to guide or monitor treatment for MRSA infections. RESULT CALLED TO, READ BACK BY AND VERIFIED WITH: ALEKSEY FRASER @0307  ON 03/01/20 SKL Performed at [facility], [address] [place], [place] [address]      Labs: BNP (last 3 results) No results for input(s): BNP in the last 8760 hours. Basic Metabolic Panel: Recent Labs  Lab 02/29/20 2215 03/01/20 0409 03/02/20 0423 03/02/20 1715 03/03/20 0155 03/04/20 0523  NA 134* 136 134*  --  133* 137  K 3.4* 3.6 3.6  --  3.6 3.4*  CL 99 103 99  --  97* 103  CO2 26 21* 26  --  26 23  GLUCOSE 166* 127* 155*  --  176* 105*  BUN 14 13 16   --  16 17  CREATININE 1.07 0.94 0.98  --  1.15 1.15  CALCIUM 8.6* 8.3* 8.3*  --  8.3* 8.4*  MG  --   --   --  1.9  --   --    Liver Function Tests: Recent Labs  Lab 03/03/20 0155 03/04/20 0523  AST 58* 46*  ALT 58* 46*  ALKPHOS 90 83  BILITOT 2.8* 2.4*  PROT 6.8 6.9  ALBUMIN 3.3* 3.2*   No results for input(s): LIPASE, AMYLASE in the last 168 hours. No results for input(s): AMMONIA in the last 168 hours. CBC: Recent Labs  Lab 02/29/20 2215 03/01/20 0409 03/02/20 0423 03/03/20 0155  WBC 8.7 8.2 6.7 6.7  HGB 13.7 13.5 11.4* 11.9*  HCT 40.1 40.8 34.2* 33.9*  MCV 84.4 86.3 86.6 82.7  PLT 153 162 156 191   Cardiac  Enzymes: No results for input(s): CKTOTAL, CKMB, CKMBINDEX, TROPONINI in the last 168 hours. BNP: Invalid input(s): POCBNP CBG: Recent Labs  Lab 03/03/20 1118 03/03/20 1533 03/03/20 2156 03/04/20 0736 03/04/20 1238  GLUCAP 106* 118* 116* 98 165*   D-Dimer No results for input(s): DDIMER in the last 72 hours. Hgb A1c No results for input(s): HGBA1C in the last 72 hours. Lipid Profile No results for input(s): CHOL, HDL, LDLCALC, TRIG, CHOLHDL, LDLDIRECT in the last 72 hours. Thyroid function studies Recent Labs    03/02/20 1715  TSH 5.222*   Anemia work up No results for input(s): VITAMINB12, FOLATE, FERRITIN, TIBC, IRON, RETICCTPCT in the last 72 hours. Urinalysis No results found for: COLORURINE, APPEARANCEUR, [place], [place], [place], [place], [place], [place], PROTEINUR, UROBILINOGEN, NITRITE, LEUKOCYTESUR Sepsis Labs Invalid input(s): PROCALCITONIN,  WBC,  LACTICIDVEN Microbiology Recent Results (from the past 240 hour(s))  SARS Coronavirus 2 by RT PCR (hospital order, performed in [facility] hospital lab) Nasopharyngeal Nasopharyngeal Swab     Status: None   Collection Time: 02/29/20 11:46 PM   Specimen: Nasopharyngeal Swab  Result Value Ref Range Status   SARS Coronavirus 2 NEGATIVE NEGATIVE Final    Comment: (NOTE) SARS-CoV-2 target nucleic acids are NOT DETECTED.  The SARS-CoV-2 RNA is generally detectable in upper and lower respiratory specimens during the acute phase of infection. The lowest concentration of SARS-CoV-2 viral copies this assay can detect is 250 copies / mL. A negative result does not preclude SARS-CoV-2 infection and should not be used as the sole basis for treatment or other patient management decisions.  A negative result may occur  with improper specimen collection / handling, submission of specimen other than nasopharyngeal swab, presence of viral mutation(s) within the areas targeted by this assay, and inadequate number of viral  copies (<250 copies / mL). A negative result must be combined with clinical observations, patient history, and epidemiological information.  Fact Sheet for Patients:   BoilerBrush.com.cy  Fact Sheet for Healthcare Providers: https://pope.com/  This test is not yet approved or  cleared by the [place] FDA and has been authorized for detection and/or diagnosis of SARS-CoV-2 by FDA under an Emergency Use Authorization (EUA).  This EUA will remain in effect (meaning this test can be used) for the duration of the COVID-19 declaration under Section 564(b)(1) of the Act, 21 U.S.C. section 360bbb-3(b)(1), unless the authorization is terminated or revoked sooner.  Performed at [facility], [address] [place], [place] [address]   MRSA PCR Screening     Status: Abnormal   Collection Time: 03/01/20  1:36 AM   Specimen: Nasal Mucosa; Nasopharyngeal  Result Value Ref Range Status   MRSA by PCR POSITIVE (A) NEGATIVE Final    Comment:        The GeneXpert MRSA Assay (FDA approved for NASAL specimens only), is one component of a comprehensive MRSA colonization surveillance program. It is not intended to diagnose MRSA infection nor to guide or monitor treatment for MRSA infections. RESULT CALLED TO, READ BACK BY AND VERIFIED WITH: ALEKSEY FRASER @0307  ON 03/01/20 SKL Performed at [facility], [address] [place], [place]      Time coordinating discharge: 40 minutes  SIGNED:   [id number], MD  [facility] 03/04/2020, 3:39 PM

## 2020-03-04 NOTE — Progress Notes (Signed)
Patient discharged home this afternoon. IV access removed and telemetry with no issues noted.  Patient has discharge packet, letter for work, and Advice worker coupon in his packet.  Patient has all personal belongings, verbalizes understanding of discharge plan, and education of medications and disease process.  Patient escorted to front entrance by this RN in wheelchair.

## 2020-03-04 NOTE — Telephone Encounter (Signed)
TOC- patient is currently admitted. Attempt to cal patient on 03/07/20. Marland Kitchen

## 2020-03-04 NOTE — Telephone Encounter (Signed)
-----   Message from Johny Shock sent at 03/04/2020  2:20 PM EDT ----- Regarding: tcm/ph 7/6 with Gillian Shields, NP at 8:30

## 2020-03-04 NOTE — Progress Notes (Signed)
Progress Note  Patient Name: Joshua Holder Date of Encounter: 03/04/2020  Primary Cardiologist: Kirke Corin  Subjective   He feels well today and denies any chest pain, dyspnea, palpitations, dizziness, presyncope, syncope.  He has maintained sinus rhythm since 13: 10 on 6/23, following brief episode of A. fib and is now off amiodarone with a duration less than 48 hours.  Ready to go home.  Inpatient Medications    Scheduled Meds:  allopurinol  100 mg Oral Daily   aspirin  81 mg Oral Daily   atorvastatin  80 mg Oral Daily   Chlorhexidine Gluconate Cloth  6 each Topical Q0600   colchicine  0.6 mg Oral BID   insulin aspart  0-5 Units Subcutaneous QHS   insulin aspart  0-9 Units Subcutaneous TID WC   metoprolol tartrate  12.5 mg Oral BID   mupirocin ointment  1 application Nasal BID   potassium chloride  40 mEq Oral BID   sodium chloride flush  3 mL Intravenous Q12H   tamsulosin  0.4 mg Oral Daily   ticagrelor  90 mg Oral BID   Continuous Infusions:  sodium chloride     sodium chloride 50 mL/hr at 03/03/20 2204   PRN Meds: sodium chloride, acetaminophen, loperamide, morphine injection, ondansetron (ZOFRAN) IV, sodium chloride flush   Vital Signs    Vitals:   03/03/20 1800 03/03/20 2000 03/04/20 0028 03/04/20 0800  BP: (!) 142/117 121/90 130/85 132/86  Pulse: 73 61    Resp: (!) 21 (!) 28 16   Temp:   97.6 F (36.4 C) 97.9 F (36.6 C)  TempSrc:   Oral Oral  SpO2: 95% 100% 99%   Weight:      Height:        Intake/Output Summary (Last 24 hours) at 03/04/2020 1256 Last data filed at 03/03/2020 2000 Gross per 24 hour  Intake 399.87 ml  Output --  Net 399.87 ml   Filed Weights   02/29/20 2203 03/01/20 0135  Weight: 127 kg 120.6 kg    Telemetry    Telemetry currently discontinued prior to cardiology rounding though prior strips were reviewed and show sinus rhythm - Personally Reviewed  ECG    No new tracings for review - Personally  Reviewed  Physical Exam   GEN: No acute distress.   Neck: No JVD. Cardiac: RRR, no murmurs, rubs, or gallops.  Right radial cardiac cath site without bleeding, bruising, swelling, warmth, erythema, or tenderness to palpation.  Radial pulse 2+. Respiratory: Clear to auscultation bilaterally.  GI: Soft, nontender, non-distended.   MS: No edema; No deformity. Neuro:  Alert and oriented x 3; Nonfocal.  Psych: Normal affect.  Labs    Chemistry Recent Labs  Lab 03/02/20 0423 03/03/20 0155 03/04/20 0523  NA 134* 133* 137  K 3.6 3.6 3.4*  CL 99 97* 103  CO2 26 26 23   GLUCOSE 155* 176* 105*  BUN 16 16 17   CREATININE 0.98 1.15 1.15  CALCIUM 8.3* 8.3* 8.4*  PROT  --  6.8 6.9  ALBUMIN  --  3.3* 3.2*  AST  --  58* 46*  ALT  --  58* 46*  ALKPHOS  --  90 83  BILITOT  --  2.8* 2.4*  GFRNONAA >60 >60 >60  GFRAA >60 >60 >60  ANIONGAP 9 10 11      Hematology Recent Labs  Lab 03/01/20 0409 03/02/20 0423 03/03/20 0155  WBC 8.2 6.7 6.7  RBC 4.73 3.95* 4.10*  HGB 13.5 11.4* 11.9*  HCT 40.8 34.2* 33.9*  MCV 86.3 86.6 82.7  MCH 28.5 28.9 29.0  MCHC 33.1 33.3 35.1  RDW 13.8 13.6 13.6  PLT 162 156 191    Cardiac EnzymesNo results for input(s): TROPONINI in the last 168 hours. No results for input(s): TROPIPOC in the last 168 hours.   BNPNo results for input(s): BNP, PROBNP in the last 168 hours.   DDimer No results for input(s): DDIMER in the last 168 hours.   Radiology    No results found.  Cardiac Studies   LHC 11/15/6576:  LV end diastolic pressure is moderately elevated.  The left ventricular ejection fraction is 35-45% by visual estimate.  There is moderate left ventricular systolic dysfunction.  Prox RCA to Mid RCA lesion is 100% stenosed.  Post intervention, there is a 100% residual stenosis.  Balloon angioplasty was performed using a BALLOON TREK RX 2.5X15.  Mid LAD lesion is 40% stenosed.   1.  Severe one-vessel coronary artery disease with thrombotic  occlusion of the proximal right coronary artery with reasonable left to right collaterals from the LAD.  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 2.  Moderately reduced LV systolic function with an EF of 35 to 40% with inferior wall akinesis. 3.  Moderately elevated left ventricular end-diastolic pressure at 27 mmHg. 4.  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  I felt that continued intervention would carry a significant risk of no reflow due to distal embolization and that would risk the already developed collaterals.  Recommendations: This was a case of inferior ST elevation myocardial infarction with late presentation after more than 24 hours of symptoms.  Initial plan was to treat him medically and consider angiography in the morning but then his chest pain worsened with diaphoresis and I elected to proceed with emergent cardiac catheterization with the above results. The patient was started on Aggrastat to be continued for 18 hours. I started the patient on oral dual antiplatelet therapy with aspirin and ticagrelor. If the patient remains clinically stable, no plans for repeat angiography and the plan is to treat him medically assuming that his left-to-right collaterals will continue to develop. If in the other hand he has recurrent symptoms or instability, relook angiography can be considered after the patient has been treated with intravenous and oral antiplatelet medications. The patient is at high risk for mechanical complications and right ventricular failure. __________  2D echo 03/01/2020: 1. Left ventricular ejection fraction, by estimation, is 50 to 55%. The  left ventricle has low normal function. The left ventricle demonstrates  regional wall motion abnormalities (see scoring diagram/findings for  description). There is  mild left  ventricular hypertrophy. Left ventricular diastolic parameters were  normal. There is severe hypokinesis of the left ventricular, basal-mid  inferior wall and inferoseptal wall.  2. Right ventricular systolic function is normal. The right ventricular  size is not well visualized. There is normal pulmonary artery systolic  pressure.  3. The mitral valve is normal in structure. Trivial mitral valve  regurgitation. No evidence of mitral stenosis.  4. The aortic valve is tricuspid. Aortic valve regurgitation is not  visualized. No aortic stenosis is present.  5. The inferior vena cava is dilated in size with <50% respiratory  variability, suggesting right atrial pressure of 15 mmHg.  Patient Profile     54 y.o. male with history  of DM2, HTN, and gout who presented on 6/21 with chest pain and inferior ST elevation MI status post PTCA of the RCA in the setting of aneurysmal disease with post MI course being complicated for brief episode of A. fib with RVR.  Assessment & Plan    1.  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  Acute HFrEF secondary to ICM: -He appears euvolemic and well compensated -Continue beta-blocker -Not currently on ACE inhibitor secondary to allergy of swelling -With slight uptrending renal function, possibly in the setting of volume depletion with diarrhea, and in the setting of angioedema noted  with ACE inhibitor initiation of ARB has been deferred at this time -CHF education  3.  A. fib with RVR: -Brief episode but developed on 6/22 at 07:06 with conversion to sinus rhythm on 6/22 at 13:10 on IV amiodarone -Heparin drip discontinued -CHA2DS2-VASc 4 -Given brief episode of A. fib lasting less than 48 hours anticoagulation has been deferred -Should he redevelop A. fib oral anticoagulation will need to be revisited -Repeating potassium to goal 4.0 as outlined below -Continue beta-blocker  4.  HTN: -Well-controlled -Continue current therapy  5.  HLD: -LDL of 133 this admission, now on high intensity statin -Follow-up fasting lipid panel and liver function in approximately 8 weeks with goal LDL being less than 70  6.  NSVT: -No further ventricular ectopy noted on telemetry -Continue beta-blocker  7.  Hypokalemia: -Likely in the setting of the patient's diarrhea (possibly in setting of IV amiodarone) -KCl is ordered  Dispo: -Okay to discharge from cardiology perspective today -We will set up follow-up appointment for the patient to be seen within 7 to 14 days in our office  For questions or updates, please contact [facility] Please consult [email or web address] for contact info under Cardiology/STEMI.    Signed, Eula Listen, PA-C [facility] Pager: [phone] 03/04/2020, 12:56 PM

## 2020-03-04 NOTE — Discharge Instructions (Signed)
Carbohydrate Counting for Diabetes Mellitus, Adult  Carbohydrate counting is a method of keeping track of how many carbohydrates you eat. Eating carbohydrates naturally increases the amount of sugar (glucose) in the blood. Counting how many carbohydrates you eat helps keep your blood glucose within normal limits, which helps you manage your diabetes (diabetes mellitus). It is important to know how many carbohydrates you can safely have in each meal. This is different for every person. A diet and nutrition specialist (registered dietitian) can help you make a meal plan and calculate how many carbohydrates you should have at each meal and snack. Carbohydrates are found in the following foods:  Grains, such as breads and cereals.  Dried beans and soy products.  Starchy vegetables, such as potatoes, peas, and corn.  Fruit and fruit juices.  Milk and yogurt.  Sweets and snack foods, such as cake, cookies, candy, chips, and soft drinks. How do I count carbohydrates? There are two ways to count carbohydrates in food. You can use either of the methods or a combination of both. Reading "Nutrition Facts" on packaged food The "Nutrition Facts" list is included on the labels of almost all packaged foods and beverages in the [place] It includes:  The serving size.  Information about nutrients in each serving, including the grams (g) of carbohydrate per serving. To use the "Nutrition Facts":  Decide how many servings you will have.  Multiply the number of servings by the number of carbohydrates per serving.  The resulting number is the total amount of carbohydrates that you will be having. Learning standard serving sizes of other foods When you eat carbohydrate foods that are not packaged or do not include "Nutrition Facts" on the label, you need to measure the servings in order to count the amount of carbohydrates:  Measure the foods that you will eat with a food scale or measuring cup, if  needed.  Decide how many standard-size servings you will eat.  Multiply the number of servings by 15. Most carbohydrate-rich foods have about 15 g of carbohydrates per serving. ? For example, if you eat 8 oz (170 g) of strawberries, you will have eaten 2 servings and 30 g of carbohydrates (2 servings x 15 g = 30 g).  For foods that have more than one food mixed, such as soups and casseroles, you must count the carbohydrates in each food that is included. The following list contains standard serving sizes of common carbohydrate-rich foods. Each of these servings has about 15 g of carbohydrates:   hamburger bun or  English muffin.   oz (15 mL) syrup.   oz (14 g) jelly.  1 slice of bread.  1 six-inch tortilla.  3 oz (85 g) cooked rice or pasta.  4 oz (113 g) cooked dried beans.  4 oz (113 g) starchy vegetable, such as peas, corn, or potatoes.  4 oz (113 g) hot cereal.  4 oz (113 g) mashed potatoes or  of a large baked potato.  4 oz (113 g) canned or frozen fruit.  4 oz (120 mL) fruit juice.  4-6 crackers.  6 chicken nuggets.  6 oz (170 g) unsweetened dry cereal.  6 oz (170 g) plain fat-free yogurt or yogurt sweetened with artificial sweeteners.  8 oz (240 mL) milk.  8 oz (170 g) fresh fruit or one small piece of fruit.  24 oz (680 g) popped popcorn. Example of carbohydrate counting Sample meal  3 oz (85 g) chicken breast.  6 oz (170 g)   brown rice.  4 oz (113 g) corn.  8 oz (240 mL) milk.  8 oz (170 g) strawberries with sugar-free whipped topping. Carbohydrate calculation 1. Identify the foods that contain carbohydrates: ? Rice. ? Corn. ? Milk. ? Strawberries. 2. Calculate how many servings you have of each food: ? 2 servings rice. ? 1 serving corn. ? 1 serving milk. ? 1 serving strawberries. 3. Multiply each number of servings by 15 g: ? 2 servings rice x 15 g = 30 g. ? 1 serving corn x 15 g = 15 g. ? 1 serving milk x 15 g = 15 g. ? 1  serving strawberries x 15 g = 15 g. 4. Add together all of the amounts to find the total grams of carbohydrates eaten: ? 30 g + 15 g + 15 g + 15 g = 75 g of carbohydrates total. Summary  Carbohydrate counting is a method of keeping track of how many carbohydrates you eat.  Eating carbohydrates naturally increases the amount of sugar (glucose) in the blood.  Counting how many carbohydrates you eat helps keep your blood glucose within normal limits, which helps you manage your diabetes.  A diet and nutrition specialist (registered dietitian) can help you make a meal plan and calculate how many carbohydrates you should have at each meal and snack. This information is not intended to replace advice given to you by your health care provider. Make sure you discuss any questions you have with your health care provider. Document Revised: 03/21/2017 Document Reviewed: 02/08/2016 Elsevier Patient Education  2020 [employer] Diabetes Mellitus and Exercise Exercising regularly is important for your overall health, especially when you have diabetes (diabetes mellitus). Exercising is not only about losing weight. It has many other health benefits, such as increasing muscle strength and bone density and reducing body fat and stress. This leads to improved fitness, flexibility, and endurance, all of which result in better overall health. Exercise has additional benefits for people with diabetes, including:  Reducing appetite.  Helping to lower and control blood glucose.  Lowering blood pressure.  Helping to control amounts of fatty substances (lipids) in the blood, such as cholesterol and triglycerides.  Helping the body to respond better to insulin (improving insulin sensitivity).  Reducing how much insulin the body needs.  Decreasing the risk for heart disease by: ? Lowering cholesterol and triglyceride levels. ? Increasing the levels of good cholesterol. ? Lowering blood glucose levels. What  is my activity plan? Your health care provider or certified diabetes educator can help you make a plan for the type and frequency of exercise (activity plan) that works for you. Make sure that you:  Do at least 150 minutes of moderate-intensity or vigorous-intensity exercise each week. This could be brisk walking, biking, or water aerobics. ? Do stretching and strength exercises, such as yoga or weightlifting, at least 2 times a week. ? Spread out your activity over at least 3 days of the week.  Get some form of physical activity every day. ? Do not go more than 2 days in a row without some kind of physical activity. ? Avoid being inactive for more than 30 minutes at a time. Take frequent breaks to walk or stretch.  Choose a type of exercise or activity that you enjoy, and set realistic goals.  Start slowly, and gradually increase the intensity of your exercise over time. What do I need to know about managing my diabetes?   Check your blood glucose before and after   exercising. ? If your blood glucose is 240 mg/dL (13.3 mmol/L) or higher before you exercise, check your urine for ketones. If you have ketones in your urine, do not exercise until your blood glucose returns to normal. ? If your blood glucose is 100 mg/dL (5.6 mmol/L) or lower, eat a snack containing 15-20 grams of carbohydrate. Check your blood glucose 15 minutes after the snack to make sure that your level is above 100 mg/dL (5.6 mmol/L) before you start your exercise.  Know the symptoms of low blood glucose (hypoglycemia) and how to treat it. Your risk for hypoglycemia increases during and after exercise. Common symptoms of hypoglycemia can include: ? Hunger. ? Anxiety. ? Sweating and feeling clammy. ? Confusion. ? Dizziness or feeling light-headed. ? Increased heart rate or palpitations. ? Blurry vision. ? Tingling or numbness around the mouth, lips, or tongue. ? Tremors or shakes. ? Irritability.  Keep a rapid-acting  carbohydrate snack available before, during, and after exercise to help prevent or treat hypoglycemia.  Avoid injecting insulin into areas of the body that are going to be exercised. For example, avoid injecting insulin into: ? The arms, when playing tennis. ? The legs, when jogging.  Keep records of your exercise habits. Doing this can help you and your health care provider adjust your diabetes management plan as needed. Write down: ? Food that you eat before and after you exercise. ? Blood glucose levels before and after you exercise. ? The type and amount of exercise you have done. ? When your insulin is expected to peak, if you use insulin. Avoid exercising at times when your insulin is peaking.  When you start a new exercise or activity, work with your health care provider to make sure the activity is safe for you, and to adjust your insulin, medicines, or food intake as needed.  Drink plenty of water while you exercise to prevent dehydration or heat stroke. Drink enough fluid to keep your urine clear or pale yellow. Summary  Exercising regularly is important for your overall health, especially when you have diabetes (diabetes mellitus).  Exercising has many health benefits, such as increasing muscle strength and bone density and reducing body fat and stress.  Your health care provider or certified diabetes educator can help you make a plan for the type and frequency of exercise (activity plan) that works for you.  When you start a new exercise or activity, work with your health care provider to make sure the activity is safe for you, and to adjust your insulin, medicines, or food intake as needed. This information is not intended to replace advice given to you by your health care provider. Make sure you discuss any questions you have with your health care provider. Document Revised: 03/21/2017 Document Reviewed: 02/06/2016 Elsevier Patient Education  2020 [employer] Inc[employer]  

## 2020-03-08 NOTE — Telephone Encounter (Signed)
Patient contacted regarding discharge from [facility] on 03/04/20.  Patient understands to follow up with provider Gillian Shields, NP on 03/17/20 at 2pm at the [place] office. Patient understands discharge instructions? yes Patient understands medications and regiment? yes Patient understands to bring all medications to this visit? yes  Patient sts that he is doing well and has no questions or concerns at this time.

## 2020-03-15 ENCOUNTER — Ambulatory Visit: Payer: Commercial Managed Care - PPO | Admitting: Family

## 2020-03-17 ENCOUNTER — Emergency Department: Payer: Commercial Managed Care - PPO

## 2020-03-17 ENCOUNTER — Ambulatory Visit: Payer: Commercial Managed Care - PPO | Admitting: Family

## 2020-03-17 ENCOUNTER — Observation Stay (HOSPITAL_BASED_OUTPATIENT_CLINIC_OR_DEPARTMENT_OTHER)
Admit: 2020-03-17 | Discharge: 2020-03-17 | Disposition: A | Discharge status: Home / Self Care | Payer: Commercial Managed Care - PPO | Attending: Physician Assistant | Admitting: Physician Assistant

## 2020-03-17 ENCOUNTER — Other Ambulatory Visit: Payer: Self-pay

## 2020-03-17 ENCOUNTER — Encounter: Payer: Self-pay | Admitting: Emergency Medicine

## 2020-03-17 ENCOUNTER — Observation Stay
Admission: EM | Admit: 2020-03-17 | Discharge: 2020-03-18 | Disposition: A | Discharge status: Home / Self Care | Payer: Commercial Managed Care - PPO | Attending: Internal Medicine | Admitting: Internal Medicine

## 2020-03-17 DIAGNOSIS — N179 Acute kidney failure, unspecified: Secondary | ICD-10-CM | POA: Insufficient documentation

## 2020-03-17 DIAGNOSIS — E1159 Type 2 diabetes mellitus with other circulatory complications: Secondary | ICD-10-CM | POA: Diagnosis not present

## 2020-03-17 DIAGNOSIS — I471 Supraventricular tachycardia: Secondary | ICD-10-CM | POA: Diagnosis not present

## 2020-03-17 DIAGNOSIS — I248 Other forms of acute ischemic heart disease: Secondary | ICD-10-CM

## 2020-03-17 DIAGNOSIS — I483 Typical atrial flutter: Secondary | ICD-10-CM | POA: Diagnosis not present

## 2020-03-17 DIAGNOSIS — I25118 Atherosclerotic heart disease of native coronary artery with other forms of angina pectoris: Secondary | ICD-10-CM | POA: Insufficient documentation

## 2020-03-17 DIAGNOSIS — Z7982 Long term (current) use of aspirin: Secondary | ICD-10-CM | POA: Diagnosis not present

## 2020-03-17 DIAGNOSIS — I4891 Unspecified atrial fibrillation: Secondary | ICD-10-CM

## 2020-03-17 DIAGNOSIS — Z20822 Contact with and (suspected) exposure to covid-19: Secondary | ICD-10-CM | POA: Diagnosis not present

## 2020-03-17 DIAGNOSIS — I222 Subsequent non-ST elevation (NSTEMI) myocardial infarction: Secondary | ICD-10-CM | POA: Insufficient documentation

## 2020-03-17 DIAGNOSIS — Z7984 Long term (current) use of oral hypoglycemic drugs: Secondary | ICD-10-CM | POA: Diagnosis not present

## 2020-03-17 DIAGNOSIS — I482 Chronic atrial fibrillation, unspecified: Secondary | ICD-10-CM | POA: Diagnosis not present

## 2020-03-17 DIAGNOSIS — Z79899 Other long term (current) drug therapy: Secondary | ICD-10-CM | POA: Diagnosis not present

## 2020-03-17 DIAGNOSIS — I214 Non-ST elevation (NSTEMI) myocardial infarction: Secondary | ICD-10-CM | POA: Diagnosis not present

## 2020-03-17 DIAGNOSIS — I4892 Unspecified atrial flutter: Secondary | ICD-10-CM

## 2020-03-17 DIAGNOSIS — E119 Type 2 diabetes mellitus without complications: Secondary | ICD-10-CM

## 2020-03-17 DIAGNOSIS — I10 Essential (primary) hypertension: Secondary | ICD-10-CM | POA: Diagnosis present

## 2020-03-17 DIAGNOSIS — M109 Gout, unspecified: Secondary | ICD-10-CM | POA: Diagnosis present

## 2020-03-17 DIAGNOSIS — I252 Old myocardial infarction: Secondary | ICD-10-CM | POA: Insufficient documentation

## 2020-03-17 DIAGNOSIS — R079 Chest pain, unspecified: Secondary | ICD-10-CM | POA: Diagnosis present

## 2020-03-17 DIAGNOSIS — Z7901 Long term (current) use of anticoagulants: Secondary | ICD-10-CM | POA: Diagnosis not present

## 2020-03-17 HISTORY — DX: Acute myocardial infarction, unspecified: I21.9

## 2020-03-17 LAB — COMPREHENSIVE METABOLIC PANEL
ALT: 17 U/L (ref 0–44)
AST: 16 U/L (ref 15–41)
Albumin: 3.7 g/dL (ref 3.5–5.0)
Alkaline Phosphatase: 78 U/L (ref 38–126)
Anion gap: 12 (ref 5–15)
BUN: 23 mg/dL — ABNORMAL HIGH (ref 6–20)
CO2: 22 mmol/L (ref 22–32)
Calcium: 9.2 mg/dL (ref 8.9–10.3)
Chloride: 99 mmol/L (ref 98–111)
Creatinine, Ser: 1.45 mg/dL — ABNORMAL HIGH (ref 0.61–1.24)
GFR calc Af Amer: >60 mL/min (ref >60)
GFR calc non Af Amer: 55 mL/min — ABNORMAL LOW (ref >60)
Glucose, Bld: 316 mg/dL — ABNORMAL HIGH (ref 70–99)
Potassium: 4.2 mmol/L (ref 3.5–5.1)
Sodium: 133 mmol/L — ABNORMAL LOW (ref 135–145)
Total Bilirubin: 1.4 mg/dL — ABNORMAL HIGH (ref 0.3–1.2)
Total Protein: 7.6 g/dL (ref 6.5–8.1)

## 2020-03-17 LAB — ECHOCARDIOGRAM COMPLETE
Height: 71 in
Weight: 4128 oz

## 2020-03-17 LAB — HEPARIN LEVEL (UNFRACTIONATED)
Heparin Unfractionated: 0.33 IU/mL (ref 0.30–0.70)
Heparin Unfractionated: 0.28 IU/mL — ABNORMAL LOW (ref 0.30–0.70)

## 2020-03-17 LAB — CBC WITH DIFFERENTIAL/PLATELET
Abs Immature Granulocytes: 0.04 10*3/uL (ref 0.00–0.07)
Basophils Absolute: 0 10*3/uL (ref 0.0–0.1)
Basophils Relative: 0 %
Eosinophils Absolute: 0 10*3/uL (ref 0.0–0.5)
Eosinophils Relative: 0 %
HCT: 36.8 % — ABNORMAL LOW (ref 39.0–52.0)
Hemoglobin: 12.5 g/dL — ABNORMAL LOW (ref 13.0–17.0)
Immature Granulocytes: 1 %
Lymphocytes Relative: 9 %
Lymphs Abs: 0.7 10*3/uL (ref 0.7–4.0)
MCH: 28.2 pg (ref 26.0–34.0)
MCHC: 34 g/dL (ref 30.0–36.0)
MCV: 83.1 fL (ref 80.0–100.0)
Monocytes Absolute: 0.1 10*3/uL (ref 0.1–1.0)
Monocytes Relative: 1 %
Neutro Abs: 6.8 10*3/uL (ref 1.7–7.7)
Neutrophils Relative %: 89 %
Platelets: 394 10*3/uL (ref 150–400)
RBC: 4.43 MIL/uL (ref 4.22–5.81)
RDW: 12.5 % (ref 11.5–15.5)
WBC: 7.6 10*3/uL (ref 4.0–10.5)
nRBC: 0 % (ref 0.0–0.2)

## 2020-03-17 LAB — HIV ANTIBODY (ROUTINE TESTING W REFLEX): HIV Screen 4th Generation wRfx: NONREACTIVE

## 2020-03-17 LAB — GLUCOSE, CAPILLARY
Glucose-Capillary: 238 mg/dL — ABNORMAL HIGH (ref 70–99)
Glucose-Capillary: 214 mg/dL — ABNORMAL HIGH (ref 70–99)
Glucose-Capillary: 307 mg/dL — ABNORMAL HIGH (ref 70–99)
Glucose-Capillary: 229 mg/dL — ABNORMAL HIGH (ref 70–99)

## 2020-03-17 LAB — TROPONIN I (HIGH SENSITIVITY)
Troponin I (High Sensitivity): 110 ng/L (ref <18)
Troponin I (High Sensitivity): 575 ng/L (ref <18)
Troponin I (High Sensitivity): 1452 ng/L (ref <18)
Troponin I (High Sensitivity): 1927 ng/L (ref <18)
Troponin I (High Sensitivity): 1654 ng/L (ref <18)
Troponin I (High Sensitivity): 1606 ng/L (ref <18)

## 2020-03-17 LAB — PROTIME-INR
INR: 1.1 (ref 0.8–1.2)
Prothrombin Time: 13.6 seconds (ref 11.4–15.2)

## 2020-03-17 LAB — TSH: TSH: 0.786 u[IU]/mL (ref 0.350–4.500)

## 2020-03-17 LAB — SARS CORONAVIRUS 2 BY RT PCR (HOSPITAL ORDER, PERFORMED IN ~~LOC~~ HOSPITAL LAB): SARS Coronavirus 2: NEGATIVE

## 2020-03-17 LAB — APTT: aPTT: 46 seconds — ABNORMAL HIGH (ref 24–36)

## 2020-03-17 LAB — MAGNESIUM: Magnesium: 1.4 mg/dL — ABNORMAL LOW (ref 1.7–2.4)

## 2020-03-17 MED ORDER — ADENOSINE 6 MG/2ML IV SOLN
INTRAVENOUS | Status: AC
  Filled 2020-03-17: qty 2

## 2020-03-17 MED ORDER — METOPROLOL TARTRATE 25 MG PO TABS
12.5000 mg | ORAL_TABLET | Freq: Once | ORAL | Status: AC
  Administered 2020-03-17: 12.5 mg via ORAL

## 2020-03-17 MED ORDER — ADENOSINE 6 MG/2ML IV SOLN
6.0000 mg | Freq: Once | INTRAVENOUS | Status: AC
  Administered 2020-03-17: 6 mg via INTRAVENOUS

## 2020-03-17 MED ORDER — DILTIAZEM LOAD VIA INFUSION
10.0000 mg | Freq: Once | INTRAVENOUS | Status: AC
  Administered 2020-03-17: 10 mg via INTRAVENOUS

## 2020-03-17 MED ORDER — METOPROLOL TARTRATE 5 MG/5ML IV SOLN: 2.5000 mg | Freq: Once | INTRAVENOUS | Status: AC

## 2020-03-17 MED ORDER — ASPIRIN EC 81 MG PO TBEC: 81.0000 mg | DELAYED_RELEASE_TABLET | Freq: Every day | ORAL | Status: DC

## 2020-03-17 MED ORDER — METOPROLOL TARTRATE 25 MG PO TABS
12.5000 mg | ORAL_TABLET | Freq: Two times a day (BID) | ORAL | Status: DC
  Administered 2020-03-17: 12.5 mg via ORAL
  Filled 2020-03-17: qty 1

## 2020-03-17 MED ORDER — MAGNESIUM SULFATE 2 GM/50ML IV SOLN
2.0000 g | Freq: Once | INTRAVENOUS | Status: AC
  Administered 2020-03-17: 2 g via INTRAVENOUS
  Filled 2020-03-17: qty 50

## 2020-03-17 MED ORDER — DILTIAZEM HCL-DEXTROSE 125-5 MG/125ML-% IV SOLN (PREMIX)
INTRAVENOUS | Status: AC
  Administered 2020-03-17: 5 mg/h via INTRAVENOUS
  Filled 2020-03-17: qty 125

## 2020-03-17 MED ORDER — DILTIAZEM HCL-DEXTROSE 125-5 MG/125ML-% IV SOLN (PREMIX): 5.0000 mg/h | INTRAVENOUS | Status: DC

## 2020-03-17 MED ORDER — ACETAMINOPHEN 325 MG PO TABS: 650.0000 mg | ORAL_TABLET | ORAL | Status: DC | PRN

## 2020-03-17 MED ORDER — DILTIAZEM HCL 25 MG/5ML IV SOLN
INTRAVENOUS | Status: AC
  Filled 2020-03-17: qty 5

## 2020-03-17 MED ORDER — ADENOSINE 12 MG/4ML IV SOLN: 12.0000 mg | Freq: Once | INTRAVENOUS | Status: AC

## 2020-03-17 MED ORDER — METOPROLOL TARTRATE 25 MG PO TABS
25.0000 mg | ORAL_TABLET | Freq: Two times a day (BID) | ORAL | Status: DC
  Administered 2020-03-17 – 2020-03-18 (×2): 25 mg via ORAL
  Filled 2020-03-17 (×2): qty 1

## 2020-03-17 MED ORDER — INSULIN ASPART 100 UNIT/ML ~~LOC~~ SOLN
0.0000 [IU] | Freq: Every day | SUBCUTANEOUS | Status: DC
  Administered 2020-03-17: 2 [IU] via SUBCUTANEOUS
  Filled 2020-03-17: qty 1

## 2020-03-17 MED ORDER — NITROGLYCERIN 0.4 MG SL SUBL: 0.4000 mg | SUBLINGUAL_TABLET | SUBLINGUAL | Status: DC | PRN

## 2020-03-17 MED ORDER — SODIUM CHLORIDE 0.9 % IV BOLUS
1000.0000 mL | Freq: Once | INTRAVENOUS | Status: AC
  Administered 2020-03-17: 1000 mL via INTRAVENOUS

## 2020-03-17 MED ORDER — INSULIN ASPART 100 UNIT/ML ~~LOC~~ SOLN
0.0000 [IU] | Freq: Three times a day (TID) | SUBCUTANEOUS | Status: DC
  Administered 2020-03-17: 5 [IU] via SUBCUTANEOUS
  Administered 2020-03-17: 11 [IU] via SUBCUTANEOUS
  Administered 2020-03-17: 5 [IU] via SUBCUTANEOUS
  Administered 2020-03-18: 8 [IU] via SUBCUTANEOUS
  Administered 2020-03-18 (×2): 3 [IU] via SUBCUTANEOUS
  Filled 2020-03-17 (×6): qty 1

## 2020-03-17 MED ORDER — HEPARIN (PORCINE) 25000 UT/250ML-% IV SOLN
1600.0000 [IU]/h | INTRAVENOUS | Status: DC
  Administered 2020-03-17 (×2): 1400 [IU]/h via INTRAVENOUS
  Administered 2020-03-18: 1600 [IU]/h via INTRAVENOUS
  Filled 2020-03-17 (×3): qty 250

## 2020-03-17 MED ORDER — ATORVASTATIN CALCIUM 80 MG PO TABS
80.0000 mg | ORAL_TABLET | Freq: Every day | ORAL | Status: DC
  Administered 2020-03-17 – 2020-03-18 (×2): 80 mg via ORAL
  Filled 2020-03-17: qty 1
  Filled 2020-03-17: qty 4

## 2020-03-17 MED ORDER — ADENOSINE 6 MG/2ML IV SOLN
INTRAVENOUS | Status: AC
  Administered 2020-03-17: 6 mg via INTRAVENOUS
  Filled 2020-03-17: qty 2

## 2020-03-17 MED ORDER — METOPROLOL TARTRATE 5 MG/5ML IV SOLN
INTRAVENOUS | Status: AC
  Administered 2020-03-17: 2.5 mg via INTRAVENOUS
  Filled 2020-03-17: qty 5

## 2020-03-17 MED ORDER — SODIUM CHLORIDE 0.9% FLUSH
10.0000 mL | Freq: Two times a day (BID) | INTRAVENOUS | Status: DC
  Administered 2020-03-17: 10 mL via INTRAVENOUS

## 2020-03-17 MED ORDER — TICAGRELOR 90 MG PO TABS
90.0000 mg | ORAL_TABLET | Freq: Two times a day (BID) | ORAL | Status: DC
  Administered 2020-03-17 – 2020-03-18 (×3): 90 mg via ORAL
  Filled 2020-03-17 (×3): qty 1

## 2020-03-17 MED ORDER — OXYCODONE HCL 5 MG PO TABS
5.0000 mg | ORAL_TABLET | Freq: Once | ORAL | Status: AC
  Administered 2020-03-17: 5 mg via ORAL
  Filled 2020-03-17: qty 1

## 2020-03-17 MED ORDER — ADENOSINE 6 MG/2ML IV SOLN: 6.0000 mg | Freq: Once | INTRAVENOUS | Status: AC

## 2020-03-17 MED ORDER — ADENOSINE 12 MG/4ML IV SOLN
INTRAVENOUS | Status: AC
  Administered 2020-03-17: 12 mg via INTRAVENOUS
  Filled 2020-03-17: qty 4

## 2020-03-17 MED ORDER — ASPIRIN 81 MG PO CHEW
81.0000 mg | CHEWABLE_TABLET | Freq: Every day | ORAL | Status: DC
  Administered 2020-03-17: 81 mg via ORAL
  Filled 2020-03-17: qty 1

## 2020-03-17 MED ORDER — ONDANSETRON HCL 4 MG/2ML IJ SOLN: 4.0000 mg | Freq: Four times a day (QID) | INTRAMUSCULAR | Status: DC | PRN

## 2020-03-17 NOTE — ED Triage Notes (Signed)
Pt presents to ED accompanied by wife with c/o sudden onset of mid sternal chest pain and shortness of breath around 0200 am. Pt states he was seen in this ED for chest pain on 6/21 and was told he had a heart attack and was admitted to ICU.

## 2020-03-17 NOTE — H&P (Signed)
History and Physical    Joshua Holder HDQ:[id number] DOB: 30-Mar-1966 DOA: 03/17/2020  PCP: System, Provider Not In   Patient coming from: Home  I have personally briefly reviewed patient's old medical records in [facility]  Chief Complaint: Chest pain, palpitations  HPI: Joshua Holder is a 54 y.o. male with medical history significant for diabetes, hypertension, recent STEMI on February 29, 2020 treated with Aggrastat, no stent and discharged on aspirin and Brilinta who presents to the emergency room after he developed sudden onset retrosternal chest pressure while asleep.  Pain was of severe intensity, nonradiating, associated with nausea but without shortness of breath and diaphoresis.  Had associated palpitations.  Had no cough fever or chills. ED Course: On arrival heart rate was 181, with normal blood pressure.  He received adenosine x3, followed by diltiazem and subsequently placed on diltiazem infusion.  EKG initially read as SVT showed rapid a flutter when rate was controlled.  Troponin 170, creatinine 1.45 up from baseline of 1.15.  Started on a heparin infusion.  Review of Systems: As per HPI otherwise all other systems on review of systems negative.    Past Medical History:  Diagnosis Date  . 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 Gout   . Hypertension   . MI (myocardial infarction) [facility])     Past Surgical History:  Procedure Laterality Date  . CORONARY/GRAFT ACUTE MI REVASCULARIZATION N/A 03/01/2020   Procedure: Coronary/Graft Acute MI Revascularization;  Surgeon: Iran Ouch, MD;  Location: [facility] INVASIVE CV LAB;  Service: Cardiovascular;  Laterality: N/A;  . INCISION AND DRAINAGE Left 12/30/2018   Procedure: INCISION AND DRAINAGE LEFT ELBOW;  Surgeon: Juanell Fairly, MD;  Location: [facility] ORS;  Service: Orthopedics;  Laterality: Left;  . LEFT HEART CATH AND CORONARY ANGIOGRAPHY N/A 03/01/2020   Procedure: LEFT HEART CATH AND CORONARY ANGIOGRAPHY;  Surgeon:  Iran Ouch, MD;  Location: [facility] INVASIVE CV LAB;  Service: Cardiovascular;  Laterality: N/A;     reports that he has never smoked. He has never used smokeless tobacco. He reports current alcohol use. He reports previous drug use.  Allergies  Allergen Reactions  . Lisinopril Swelling and Cough    History reviewed. No pertinent family history.    Prior to Admission medications   Medication Sig Start Date End Date Taking? Authorizing Provider  aspirin 81 MG chewable tablet Chew 1 tablet (81 mg total) by mouth daily. 03/05/20 04/04/20 Yes Dorcas Carrow, MD  atorvastatin (LIPITOR) 80 MG tablet Take 1 tablet (80 mg total) by mouth daily. 03/05/20 04/04/20 Yes Ghimire, Lyndel Safe, MD  metFORMIN (GLUCOPHAGE-XR) 500 MG 24 hr tablet Take 1,000 mg by mouth daily with breakfast.   Yes [provider]  metoprolol tartrate (LOPRESSOR) 25 MG tablet Take 0.5 tablets (12.5 mg total) by mouth 2 (two) times daily. 03/04/20 04/03/20 Yes Ghimire, Lyndel Safe, MD  potassium chloride SA (KLOR-CON) 20 MEQ tablet Take 1 tablet (20 mEq total) by mouth daily for 7 days. 03/04/20 03/17/20 Yes Ghimire, Lyndel Safe, MD  tamsulosin (FLOMAX) 0.4 MG CAPS capsule Take 0.4 mg by mouth daily. 11/07/19  Yes [provider]  ticagrelor (BRILINTA) 90 MG TABS tablet Take 1 tablet (90 mg total) by mouth 2 (two) times daily. 03/04/20 04/03/20 Yes Ghimire, Lyndel Safe, MD  predniSONE (DELTASONE) 20 MG tablet prednisone 20 mg tablet Patient not taking: Reported on 02/29/2020    [provider]  Tadalafil 2.5 MG TABS Take 1 tablet by mouth daily. Patient not taking: Reported on  03/01/2020 02/19/20   [provider]    Physical Exam: Vitals:   03/17/20 0313 03/17/20 0314 03/17/20 0324 03/17/20 0342  BP: 116/84     Pulse: (!) 166  (!) 166 98  Resp: (!) 22  14 20   Temp:      SpO2: 100%  100% 100%  Weight:  117 kg    Height:  5\' 11"  (1.803 m)       Vitals:   03/17/20 0313 03/17/20 0314 03/17/20 0324 03/17/20 0342    BP: 116/84     Pulse: (!) 166  (!) 166 98  Resp: (!) 22  14 20   Temp:      SpO2: 100%  100% 100%  Weight:  117 kg    Height:  5\' 11"  (1.803 m)        Constitutional:  A bit somnolent but oriented x3, not in acute distress  HEENT:      Head: Normocephalic and atraumatic.         Eyes: PERLA, EOMI, Conjunctivae are normal. Sclera is non-icteric.       Mouth/Throat: Mucous membranes are moist.       Neck: Supple with no signs of meningismus. Cardiovascular:  Regular in the 90s. No murmurs, gallops, or rubs. 2+ symmetrical distal pulses are present . No JVD. No LE edema Respiratory: Respiratory effort normal .Lungs sounds clear bilaterally. No wheezes, crackles, or rhonchi.  Gastrointestinal: Soft, non tender, and non distended with positive bowel sounds. No rebound or guarding. Genitourinary: No CVA tenderness. Musculoskeletal: Nontender with normal range of motion in all extremities. No cyanosis, or erythema of extremities. Neurologic: Normal speech and language. Face is symmetric. Moving all extremities. No gross focal neurologic deficits . Skin: Skin is warm, dry.  No rash or ulcers Psychiatric: Mood and affect are normal Speech and behavior are normal   Labs on Admission: I have personally reviewed following labs and imaging studies  CBC: Recent Labs  Lab 03/17/20 0329  WBC 7.6  NEUTROABS 6.8  HGB 12.5*  HCT 36.8*  MCV 83.1  PLT 394   Basic Metabolic Panel: Recent Labs  Lab 03/17/20 0329  NA 133*  K 4.2  CL 99  CO2 22  GLUCOSE 316*  BUN 23*  CREATININE 1.45*  CALCIUM 9.2  MG 1.4*   GFR: Estimated Creatinine Clearance: 77.5 mL/min (A) (by C-G formula based on SCr of 1.45 mg/dL (H)). Liver Function Tests: Recent Labs  Lab 03/17/20 0329  AST 16  ALT 17  ALKPHOS 78  BILITOT 1.4*  PROT 7.6  ALBUMIN 3.7   No results for input(s): LIPASE, AMYLASE in the last 168 hours. No results for input(s): AMMONIA in the last 168 hours. Coagulation Profile: No  results for input(s): INR, PROTIME in the last 168 hours. Cardiac Enzymes: No results for input(s): CKTOTAL, CKMB, CKMBINDEX, TROPONINI in the last 168 hours. BNP (last 3 results) No results for input(s): PROBNP in the last 8760 hours. HbA1C: No results for input(s): HGBA1C in the last 72 hours. CBG: No results for input(s): GLUCAP in the last 168 hours. Lipid Profile: No results for input(s): CHOL, HDL, LDLCALC, TRIG, CHOLHDL, LDLDIRECT in the last 72 hours. Thyroid Function Tests: Recent Labs    03/17/20 0329  TSH 0.786   Anemia Panel: No results for input(s): VITAMINB12, FOLATE, FERRITIN, TIBC, IRON, RETICCTPCT in the last 72 hours. Urine analysis: No results found for: COLORURINE, APPEARANCEUR, LABSPEC, PHURINE, GLUCOSEU, HGBUR, BILIRUBINUR, KETONESUR, PROTEINUR, UROBILINOGEN, NITRITE, LEUKOCYTESUR  Radiological Exams on  Admission: DG Chest Portable 1 View  Result Date: 03/17/2020 CLINICAL DATA:  Shortness of breath, sudden onset EXAM: PORTABLE CHEST 1 VIEW COMPARISON:  02/29/2020 FINDINGS: Artifact from EKG leads and defibrillator pads. Mild prominence of lung markings without Kerley lines, likely accentuated by lower volumes. No consolidation or pneumothorax. No pleural effusion. Normal heart size IMPRESSION: Negative portable chest. Electronically Signed   By: Marnee Spring M.D.   On: 03/17/2020 04:18    EKG: Independently reviewed. Interpretation : A flutter with 2-1 block rate 104  Assessment/Plan 54 year old male with history of diabetes and hypertension recent STEMI on February 29, 2020 treated with Aggrastat, no stent presenting with chest pain with finding of rapid a flutter  NSTEMI, in the setting of recent STEMI -Patient presents with chest pain with elevated troponin -Suspects related to demand ischemia from rapid a flutter -Given recent STEMI, will treat as NSTEMI with heparin infusion -Continue aspirin and atorvastatin.  Will hold Brilinta.  Continue metoprolol     Atrial fibrillation/flutter with RVR (HCC) -Presented with rate of 181, now controlled on diltiazem infusion -Continue diltiazem infusion -CHA2DS2-VASc score of 2-3 so will benefit from systemic anticoagulation for stroke prevention -Heparin infusion as above    DM (diabetes mellitus) (HCC) -Sliding scale insulin for now pending med rec    HTN (hypertension) -Continue metoprolol    Gout -Patient was treated for acute gout flare by his PCP the day prior to arrival    AKI (acute kidney injury) (HCC) -Creatinine 1.45, above baseline of 1.15 -Continue to monitor    DVT prophylaxis: Heparin infusion Code Status: full code  Family Communication:  none  Disposition Plan: Back to previous home environment Consults called: Cardiology  Status: Observation    Andris Baumann MD [facility]     03/17/2020, 5:11 AM

## 2020-03-17 NOTE — ED Notes (Addendum)
Attempted to call report x2

## 2020-03-17 NOTE — ED Notes (Addendum)
NP at bedside.

## 2020-03-17 NOTE — Progress Notes (Signed)
ANTICOAGULATION CONSULT NOTE  Pharmacy Consult for Heparin Indication: chest pain/ACS  Allergies  Allergen Reactions  . Lisinopril Swelling and Cough    Patient Measurements: Height: 5\' 11"  (180.3 cm) Weight: 117 kg (258 lb) IBW/kg (Calculated) : 75.3 HEPARIN DW (KG): 101  Vital Signs: Temp: 97.7 F (36.5 C) (07/08 0255) BP: 125/98 (07/08 1430) Pulse Rate: 78 (07/08 1430)  Labs: Recent Labs    03/17/20 0304 03/17/20 0329 03/17/20 0329 03/17/20 0605 03/17/20 1116 03/17/20 1118 03/17/20 1412  HGB  --  12.5*  --   --   --   --   --   HCT  --  36.8*  --   --   --   --   --   PLT  --  394  --   --   --   --   --   APTT  --   --   --   --  46*  --   --   LABPROT 13.6  --   --   --   --   --   --   INR 1.1  --   --   --   --   --   --   HEPARINUNFRC  --   --   --   --   --   --  0.33  CREATININE  --  1.45*  --   --   --   --   --   TROPONINIHS  --  110*   < > 575*  --  1,452* 1,927*   < > = values in this interval not displayed.    Estimated Creatinine Clearance: 77.5 mL/min (A) (by C-G formula based on SCr of 1.45 mg/dL (H)).   Medical History: Past Medical History:  Diagnosis Date  . 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 Gout   . Hypertension   . MI (myocardial infarction) (HCC)     Medications:  (Not in a hospital admission)   Assessment: Baseline labs ordered.  No anticoagulants PTA per med list.  Asked to initiate Heparin for ACS w/o bolus.  7/8 1412 HL 0.33   Goal of Therapy:  Heparin level 0.3-0.7 units/ml Monitor platelets by anticoagulation protocol: Yes   Plan:  Heparin level is therapeutic. Will continue current heparin rate (1400 units/hr). Reheck anti-xa level 6 hours. CBC daily while on heparin.   05/18/20, PharmD, BCPS 03/17/2020,2:46 PM

## 2020-03-17 NOTE — ED Notes (Signed)
Repeat troponin and blue top sent to lab at this time

## 2020-03-17 NOTE — Progress Notes (Signed)
ANTICOAGULATION CONSULT NOTE - Initial Consult  Pharmacy Consult for Heparin Indication: chest pain/ACS  Allergies  Allergen Reactions  . Lisinopril Swelling and Cough    Patient Measurements: Height: 5\' 11"  (180.3 cm) Weight: 117 kg (258 lb) IBW/kg (Calculated) : 75.3 HEPARIN DW (KG): 101  Vital Signs: Temp: 97.7 F (36.5 C) (07/08 0255) BP: 116/84 (07/08 0313) Pulse Rate: 98 (07/08 0342)  Labs: Recent Labs    03/17/20 0329  HGB 12.5*  HCT 36.8*  PLT 394  CREATININE 1.45*  TROPONINIHS 110*    Estimated Creatinine Clearance: 77.5 mL/min (A) (by C-G formula based on SCr of 1.45 mg/dL (H)).   Medical History: Past Medical History:  Diagnosis Date  . 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 Gout   . Hypertension   . MI (myocardial infarction) (HCC)     Medications:  (Not in a hospital admission)   Assessment: Baseline labs ordered.  No anticoagulants PTA per med list.  Asked to initiate Heparin for ACS w/o bolus.  Goal of Therapy:  Heparin level 0.3-0.7 units/ml Monitor platelets by anticoagulation protocol: Yes   Plan:  Heparin infusion at 1400 units/hr (no bolus per MD) Check HL 6 hours after heparin started  05/18/20 A 03/17/2020,6:20 AM

## 2020-03-17 NOTE — ED Provider Notes (Signed)
[facility] Emergency Department Provider Note  ____________________________________________   First MD Initiated Contact with Patient 03/17/20 [phone]     (approximate)  I have reviewed the triage vital signs and the nursing notes.   HISTORY  Chief Complaint Chest Pain    HPI Joshua Holder is a 54 y.o. male  With PMHx HTN, MI, DM, here with CP. Pt states he awoke tonight with substernal aching, dull chest pressure. He felt like he could not breathe. He states he had a gout flare yesterday and was given a shot of cortisone by his PCP. He also took a dose of prednisone that he had not told his PCP about. He reports that he felt fine going ot bed but then awoke with sx. He feels like his heart is beating "out of control" fast and he feels some lightheadedness, dizziness. No other complaints. No leg swelling. He has been taking his new meds as prescribed after recent STEMI.       Past Medical History:  Diagnosis Date  . 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 Gout   . Hypertension   . MI (myocardial infarction) [facility])     Patient Active Problem List   Diagnosis Date Noted  . History of ST elevation myocardial infarction (STEMI) 02/29/20 03/17/2020  . AKI (acute kidney injury) (HCC) 03/17/2020  . Atrial flutter (HCC) 03/17/2020  . Demand ischemia (HCC) 03/17/2020  . Atrial fibrillation with RVR (HCC) 03/02/2020  . Ischemic cardiomyopathy 03/02/2020  . Acute ST elevation myocardial infarction (STEMI) of inferior wall (HCC) 03/01/2020  . NSTEMI (non-ST elevated myocardial infarction) (HCC) 02/29/2020  . Olecranon bursitis of left elbow 01/13/2019  . DM (diabetes mellitus) (HCC) 01/13/2019  . HTN (hypertension) 01/13/2019  . Gout 01/13/2019  . Cellulitis 12/29/2018    Past Surgical History:  Procedure Laterality Date  . CORONARY/GRAFT ACUTE MI REVASCULARIZATION N/A 03/01/2020   Procedure: Coronary/Graft Acute MI Revascularization;  Surgeon: Iran Ouch, MD;  Location: [facility] INVASIVE CV LAB;  Service: Cardiovascular;  Laterality: N/A;  . INCISION AND DRAINAGE Left 12/30/2018   Procedure: INCISION AND DRAINAGE LEFT ELBOW;  Surgeon: Juanell Fairly, MD;  Location: [facility] ORS;  Service: Orthopedics;  Laterality: Left;  . LEFT HEART CATH AND CORONARY ANGIOGRAPHY N/A 03/01/2020   Procedure: LEFT HEART CATH AND CORONARY ANGIOGRAPHY;  Surgeon: Iran Ouch, MD;  Location: [facility] INVASIVE CV LAB;  Service: Cardiovascular;  Laterality: N/A;    Prior to Admission medications   Medication Sig Start Date End Date Taking? Authorizing Provider  aspirin 81 MG chewable tablet Chew 1 tablet (81 mg total) by mouth daily. 03/05/20 04/04/20 Yes Dorcas Carrow, MD  atorvastatin (LIPITOR) 80 MG tablet Take 1 tablet (80 mg total) by mouth daily. 03/05/20 04/04/20 Yes Ghimire, Lyndel Safe, MD  metFORMIN (GLUCOPHAGE-XR) 500 MG 24 hr tablet Take 1,000 mg by mouth daily with breakfast.   Yes [provider]  metoprolol tartrate (LOPRESSOR) 25 MG tablet Take 0.5 tablets (12.5 mg total) by mouth 2 (two) times daily. 03/04/20 04/03/20 Yes Ghimire, Lyndel Safe, MD  potassium chloride SA (KLOR-CON) 20 MEQ tablet Take 1 tablet (20 mEq total) by mouth daily for 7 days. 03/04/20 03/17/20 Yes Ghimire, Lyndel Safe, MD  tamsulosin (FLOMAX) 0.4 MG CAPS capsule Take 0.4 mg by mouth daily. 11/07/19  Yes [provider]  ticagrelor (BRILINTA) 90 MG TABS tablet Take 1 tablet (90 mg total) by mouth 2 (two) times daily. 03/04/20 04/03/20 Yes Dorcas Carrow, MD  predniSONE (DELTASONE)  20 MG tablet prednisone 20 mg tablet Patient not taking: Reported on 02/29/2020    [provider]  Tadalafil 2.5 MG TABS Take 1 tablet by mouth daily. Patient not taking: Reported on 03/01/2020 02/19/20   [provider]    Allergies Lisinopril  History reviewed. No pertinent family history.  Social History Social History   Tobacco Use  . Smoking status: Never Smoker  . Smokeless  tobacco: Never Used  Substance Use Topics  . Alcohol use: Yes    Comment: social  . Drug use: Not Currently    Review of Systems  Review of Systems  Constitutional: Positive for fatigue. Negative for chills and fever.  HENT: Negative for sore throat.   Respiratory: Positive for chest tightness and shortness of breath.   Cardiovascular: Positive for chest pain.  Gastrointestinal: Negative for abdominal pain.  Genitourinary: Negative for flank pain.  Musculoskeletal: Negative for neck pain.  Skin: Negative for rash and wound.  Allergic/Immunologic: Negative for immunocompromised state.  Neurological: Negative for weakness and numbness.  Hematological: Does not bruise/bleed easily.  All other systems reviewed and are negative.    ____________________________________________  PHYSICAL EXAM:      VITAL SIGNS: ED Triage Vitals  Enc Vitals Group     BP 03/17/20 0255 117/89     Pulse Rate 03/17/20 0255 (!) 171     Resp 03/17/20 0255 18     Temp 03/17/20 0255 97.7 F (36.5 C)     Temp src --      SpO2 03/17/20 0255 100 %     Weight 03/17/20 0314 258 lb (117 kg)     Height 03/17/20 0314  (1.803 m)     Head Circumference --      Peak Flow --      Pain Score 03/17/20 0314 5     Pain Loc --      Pain Edu? --      Excl. in GC? --      Physical Exam Vitals and nursing note reviewed.  Constitutional:      General: He is not in acute distress.    Appearance: He is well-developed. He is diaphoretic.  HENT:     Head: Normocephalic and atraumatic.  Eyes:     Conjunctiva/sclera: Conjunctivae normal.  Cardiovascular:     Rate and Rhythm: Regular rhythm. Tachycardia present.     Heart sounds: Normal heart sounds. No murmur heard.  No friction rub.  Pulmonary:     Effort: Pulmonary effort is normal. No respiratory distress.     Breath sounds: Normal breath sounds. No wheezing or rales.  Abdominal:     General: There is no distension.     Palpations: Abdomen is soft.      Tenderness: There is no abdominal tenderness.  Musculoskeletal:     Cervical back: Neck supple.  Skin:    General: Skin is warm.     Capillary Refill: Capillary refill takes less than 2 seconds.  Neurological:     Mental Status: He is alert and oriented to person, place, and time.     Motor: No abnormal muscle tone.       ____________________________________________   LABS (all labs ordered are listed, but only abnormal results are displayed)  Labs Reviewed  CBC WITH DIFFERENTIAL/PLATELET - Abnormal; Notable for the following components:      Result Value   Hemoglobin 12.5 (*)    HCT 36.8 (*)    All other components within normal limits  COMPREHENSIVE METABOLIC PANEL - Abnormal; Notable for the following components:   Sodium 133 (*)    Glucose, Bld 316 (*)    BUN 23 (*)    Creatinine, Ser 1.45 (*)    Total Bilirubin 1.4 (*)    GFR calc non Af Amer 55 (*)    All other components within normal limits  MAGNESIUM - Abnormal; Notable for the following components:   Magnesium 1.4 (*)    All other components within normal limits  APTT - Abnormal; Notable for the following components:   aPTT 46 (*)    All other components within normal limits  GLUCOSE, CAPILLARY - Abnormal; Notable for the following components:   Glucose-Capillary 238 (*)    All other components within normal limits  GLUCOSE, CAPILLARY - Abnormal; Notable for the following components:   Glucose-Capillary 214 (*)    All other components within normal limits  GLUCOSE, CAPILLARY - Abnormal; Notable for the following components:   Glucose-Capillary 307 (*)    All other components within normal limits  GLUCOSE, CAPILLARY - Abnormal; Notable for the following components:   Glucose-Capillary 229 (*)    All other components within normal limits  TROPONIN I (HIGH SENSITIVITY) - Abnormal; Notable for the following components:   Troponin I (High Sensitivity) 110 (*)    All other components within normal limits    TROPONIN I (HIGH SENSITIVITY) - Abnormal; Notable for the following components:   Troponin I (High Sensitivity) 575 (*)    All other components within normal limits  TROPONIN I (HIGH SENSITIVITY) - Abnormal; Notable for the following components:   Troponin I (High Sensitivity) 1,452 (*)    All other components within normal limits  TROPONIN I (HIGH SENSITIVITY) - Abnormal; Notable for the following components:   Troponin I (High Sensitivity) 1,927 (*)    All other components within normal limits  TROPONIN I (HIGH SENSITIVITY) - Abnormal; Notable for the following components:   Troponin I (High Sensitivity) 1,654 (*)    All other components within normal limits  SARS CORONAVIRUS 2 BY RT PCR (HOSPITAL ORDER, PERFORMED IN [facility])  TSH  HEPARIN LEVEL (UNFRACTIONATED)  HIV ANTIBODY (ROUTINE TESTING W REFLEX)  PROTIME-INR  HEPARIN LEVEL (UNFRACTIONATED)  BASIC METABOLIC PANEL  MAGNESIUM  CBC  TROPONIN I (HIGH SENSITIVITY)    ____________________________________________  EKG: Supraventricular tachycardia, SVT 172, QRS 90, QTc 453. Since last EKG, SVT is new and has replaced sinus rhythm. Non-specific ST changes, likely rate-related.  ________________________________________  RADIOLOGY All imaging, including plain films, CT scans, and ultrasounds, independently reviewed by me, and interpretations confirmed via formal radiology reads.  ED MD interpretation:   CXR: CLear  Official radiology report(s): DG Chest Portable 1 View  Result Date: 03/17/2020 CLINICAL DATA:  Shortness of breath, sudden onset EXAM: PORTABLE CHEST 1 VIEW COMPARISON:  02/29/2020 FINDINGS: Artifact from EKG leads and defibrillator pads. Mild prominence of lung markings without Kerley lines, likely accentuated by lower volumes. No consolidation or pneumothorax. No pleural effusion. Normal heart size IMPRESSION: Negative portable chest. Electronically Signed   By: Marnee Spring M.D.   On: 03/17/2020  04:18   ECHOCARDIOGRAM COMPLETE  Result Date: 03/17/2020    ECHOCARDIOGRAM REPORT   Patient Name:   Joshua Holder Date of Exam: 03/17/2020 Medical Rec #:  [id number]       Height:       71.0 in Accession #:    [id number]      Weight:  258.0 lb Date of Birth:  21-Jan-1966      BSA:          2.350 m Patient Age:    53 years        BP:           129/85 mmHg Patient Gender: M               HR:           67 bpm. Exam Location:  [facility] Procedure: 2D Echo, Cardiac Doppler and Color Doppler Indications:     CHF 428.0  History:         Patient has prior history of Echocardiogram examinations, most                  recent 03/01/2020. Risk Factors:Hypertension and Diabetes. MI.  Sonographer:     Cristela Blue RDCS (AE) Referring Phys:  [id number] Lennon Alstrom Diagnosing Phys: Julien Nordmann MD  Sonographer Comments: Technically challenging study due to limited acoustic windows. IMPRESSIONS  1. Left ventricular ejection fraction, by estimation, is 50%. The left ventricle has low normal function. Inferior wall hypokinesis. There is moderate left ventricular hypertrophy. Left ventricular diastolic parameters are consistent with Grade II diastolic dysfunction (pseudonormalization).  2. Right ventricular systolic function is normal. The right ventricular size is normal. There is mildly elevated pulmonary artery systolic pressure. The estimated right ventricular systolic pressure is 38.1 mmHg.  3. Mild to moderate mitral valve regurgitation. FINDINGS  Left Ventricle: Left ventricular ejection fraction, by estimation, is 50 to 55%. The left ventricle has low normal function. Inferior wall hypokinesis. The left ventricular internal cavity size was normal in size. There is moderate left ventricular hypertrophy. Left ventricular diastolic parameters are consistent with Grade II diastolic dysfunction (pseudonormalization). Right Ventricle: The right ventricular size is normal. No increase in right ventricular wall thickness. Right  ventricular systolic function is normal. There is mildly elevated pulmonary artery systolic pressure. The tricuspid regurgitant velocity is 2.65  m/s, and with an assumed right atrial pressure of 10 mmHg, the estimated right ventricular systolic pressure is 38.1 mmHg. Left Atrium: Left atrial size was normal in size. Right Atrium: Right atrial size was normal in size. Pericardium: There is no evidence of pericardial effusion. Mitral Valve: The mitral valve is normal in structure. Normal mobility of the mitral valve leaflets. Mild to moderate mitral valve regurgitation. No evidence of mitral valve stenosis. Tricuspid Valve: The tricuspid valve is normal in structure. Tricuspid valve regurgitation is mild . No evidence of tricuspid stenosis. Aortic Valve: The aortic valve was not well visualized. Aortic valve regurgitation is mild. No aortic stenosis is present. Aortic valve mean gradient measures 2.5 mmHg. Aortic valve peak gradient measures 4.5 mmHg. Aortic valve area, by VTI measures 2.93  cm. Pulmonic Valve: The pulmonic valve was normal in structure. Pulmonic valve regurgitation is not visualized. No evidence of pulmonic stenosis. Aorta: The aortic root is normal in size and structure. Venous: The inferior vena cava is normal in size with greater than 50% respiratory variability, suggesting right atrial pressure of 3 mmHg. IAS/Shunts: No atrial level shunt detected by color flow Doppler.  LEFT VENTRICLE PLAX 2D LVIDd:         4.41 cm  Diastology LVIDs:         3.29 cm  LV e' lateral:   11.10 cm/s LV PW:         1.35 cm  LV E/e' lateral: 8.3 LV IVS:  1.10 cm  LV e' medial:    5.11 cm/s LVOT diam:     2.10 cm  LV E/e' medial:  18.1 LV SV:         57 LV SV Index:   24 LVOT Area:     3.46 cm  RIGHT VENTRICLE RV Basal diam:  4.37 cm RV S prime:     7.51 cm/s TAPSE (M-mode): 3.9 cm LEFT ATRIUM             Index       RIGHT ATRIUM           Index LA diam:        3.80 cm 1.62 cm/m  RA Area:     22.70 cm LA Vol  (A2C):   60.6 ml 25.79 ml/m RA Volume:   69.50 ml  29.58 ml/m LA Vol (A4C):   42.6 ml 18.13 ml/m LA Biplane Vol: 52.7 ml 22.43 ml/m  AORTIC VALVE                   PULMONIC VALVE AV Area (Vmax):    2.57 cm    PV Vmax:        0.83 m/s AV Area (Vmean):   2.81 cm    PV Peak grad:   2.7 mmHg AV Area (VTI):     2.93 cm    RVOT Peak grad: 3 mmHg AV Vmax:           106.50 cm/s AV Vmean:          70.300 cm/s AV VTI:            0.195 m AV Peak Grad:      4.5 mmHg AV Mean Grad:      2.5 mmHg LVOT Vmax:         79.00 cm/s LVOT Vmean:        57.000 cm/s LVOT VTI:          0.165 m LVOT/AV VTI ratio: 0.85  AORTA Ao Root diam: 3.70 cm MITRAL VALVE               TRICUSPID VALVE MV Area (PHT): 3.99 cm    TR Peak grad:   28.1 mmHg MV Decel Time: 190 msec    TR Vmax:        265.00 cm/s MV E velocity: 92.50 cm/s MV A velocity: 55.70 cm/s  SHUNTS MV E/A ratio:  1.66        Systemic VTI:  0.16 m                            Systemic Diam: 2.10 cm Julien Nordmann MD Electronically signed by Julien Nordmann MD Signature Date/Time: 03/17/2020/6:18:41 PM    Final     ____________________________________________  PROCEDURES   Procedure(s) performed (including Critical Care):  .Critical Care Performed by: Shaune Pollack, MD Authorized by: Shaune Pollack, MD   Critical care provider statement:    Critical care time (minutes):  45   Critical care time was exclusive of:  Separately billable procedures and treating other patients and teaching time   Critical care was necessary to treat or prevent imminent or life-threatening deterioration of the following conditions:  Cardiac failure, circulatory failure and respiratory failure   Critical care was time spent personally by me on the following activities:  Development of treatment plan with patient or surrogate, discussions with consultants, evaluation of patient's response to treatment, examination  of patient, obtaining history from patient or surrogate, ordering and  performing treatments and interventions, ordering and review of laboratory studies, ordering and review of radiographic studies, pulse oximetry, re-evaluation of patient's condition and review of old charts   I assumed direction of critical care for this patient from another provider in my specialty: no   .1-3 Lead EKG Interpretation Performed by: Shaune Pollack, MD Authorized by: Shaune Pollack, MD     Interpretation: non-specific     ECG rate:  110-170   ECG rate assessment: tachycardic     Rhythm: atrial fibrillation     Ectopy: PVCs     Conduction: normal   Comments:     Indication: SVT    ____________________________________________  INITIAL IMPRESSION / MDM / ASSESSMENT AND PLAN / ED COURSE  As part of my medical decision making, I reviewed the following data within the electronic MEDICAL RECORD NUMBER Nursing notes reviewed and incorporated, Old chart reviewed, Notes from prior ED visits, and [place] Controlled Substance Database       *Joshua Holder was evaluated in Emergency Department on 03/17/2020 for the symptoms described in the history of present illness. He was evaluated in the context of the global COVID-19 pandemic, which necessitated consideration that the patient might be at risk for infection with the SARS-CoV-2 virus that causes COVID-19. Institutional protocols and algorithms that pertain to the evaluation of patients at risk for COVID-19 are in a state of rapid change based on information released by regulatory bodies including the CDC and federal and state organizations. These policies and algorithms were followed during the patient's care in the ED.  Some ED evaluations and interventions may be delayed as a result of limited staffing during the pandemic.*     Medical Decision Making:  54 yo M here with acute onset SOB, chest pain. On arrival, pt noted to be in AFib RVR vs SVT. Adenosine given x 3 with slowing of rate to near 80-90 in what appears to be AFib, then return  to rate 170s. Ultimately, dilt bolus and gtt able to convert to AFib RVR with ultimate rate control. CP resolved with rate control, but given his recent PCI, will place on heparin and admit. Labs, records reviewed from prior visits, and trop elevated but is significantly decreased from prior, which is overall reassuring. No STEMI on EKG after conversion to AFib.  ____________________________________________  FINAL CLINICAL IMPRESSION(S) / ED DIAGNOSES  Final diagnoses:  SVT (supraventricular tachycardia) (HCC)  Atrial fibrillation with rapid ventricular response (HCC)     MEDICATIONS GIVEN DURING THIS VISIT:  Medications  heparin ADULT infusion 100 units/mL (25000 units/218mL sodium chloride 0.45%) (1,400 Units/hr Intravenous New Bag/Given 03/17/20 0708)  atorvastatin (LIPITOR) tablet 80 mg (80 mg Oral Given 03/17/20 0936)  nitroGLYCERIN (NITROSTAT) SL tablet 0.4 mg (has no administration in time range)  acetaminophen (TYLENOL) tablet 650 mg (has no administration in time range)  ondansetron (ZOFRAN) injection 4 mg (has no administration in time range)  insulin aspart (novoLOG) injection 0-15 Units (11 Units Subcutaneous Given 03/17/20 1700)  insulin aspart (novoLOG) injection 0-5 Units (has no administration in time range)  metoprolol tartrate (LOPRESSOR) tablet 25 mg (has no administration in time range)  ticagrelor (BRILINTA) tablet 90 mg (90 mg Oral Given 03/17/20 1239)  adenosine (ADENOCARD) 6 MG/2ML injection 6 mg (6 mg Intravenous Given 03/17/20 0317)  metoprolol tartrate (LOPRESSOR) injection 2.5 mg (2.5 mg Intravenous Given 03/17/20 0313)  adenosine (ADENOCARD) 6 MG/2ML injection 6 mg (6 mg Intravenous Given 03/17/20  0319)  adenosine (ADENOCARD) 12 MG/4ML injection 12 mg (12 mg Intravenous Given 03/17/20 0322)  sodium chloride 0.9 % bolus 1,000 mL (0 mLs Intravenous Stopped 03/17/20 0550)  diltiazem (CARDIZEM) 1 mg/mL load via infusion 10 mg ( Intravenous Canceled Entry 03/17/20 1348)  magnesium  sulfate IVPB 2 g 50 mL (0 g Intravenous Stopped 03/17/20 0926)  metoprolol tartrate (LOPRESSOR) tablet 12.5 mg (12.5 mg Oral Given 03/17/20 1115)     ED Discharge Orders         Ordered    Amb referral to AFIB Clinic     Discontinue  Reprint     03/17/20 1610           Note:  This document was prepared using Dragon voice recognition software and may include unintentional dictation errors.   Shaune Pollack, MD 03/17/20 2110

## 2020-03-17 NOTE — ED Notes (Signed)
Cardiologist at bedside.  

## 2020-03-17 NOTE — Progress Notes (Signed)
ANTICOAGULATION CONSULT NOTE  Pharmacy Consult for Heparin Indication: chest pain/ACS  Allergies  Allergen Reactions  . Lisinopril Swelling and Cough    Patient Measurements: Height: 5\' 11"  (180.3 cm) Weight: 117.1 kg (258 lb 1.6 oz) IBW/kg (Calculated) : 75.3 HEPARIN DW (KG): 101  Vital Signs: Temp: 97.7 F (36.5 C) (07/08 2001) Temp Source: Oral (07/08 2001) BP: 108/85 (07/08 2001) Pulse Rate: 79 (07/08 2001)  Labs: Recent Labs    03/17/20 0304 03/17/20 0329 03/17/20 0605 03/17/20 1116 03/17/20 1118 03/17/20 1412 03/17/20 1544 03/17/20 2129  HGB  --  12.5*  --   --   --   --   --   --   HCT  --  36.8*  --   --   --   --   --   --   PLT  --  394  --   --   --   --   --   --   APTT  --   --   --  46*  --   --   --   --   LABPROT 13.6  --   --   --   --   --   --   --   INR 1.1  --   --   --   --   --   --   --   HEPARINUNFRC  --   --   --   --   --  0.33  --  0.28*  CREATININE  --  1.45*  --   --   --   --   --   --   TROPONINIHS  --  110*   < >  --    < > 1,927* 1,654* 1,606*   < > = values in this interval not displayed.    Estimated Creatinine Clearance: 77.5 mL/min (A) (by C-G formula based on SCr of 1.45 mg/dL (H)).   Medical History: Past Medical History:  Diagnosis Date  . 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 Gout   . Hypertension   . MI (myocardial infarction) (HCC)     Medications:  Medications Prior to Admission  Medication Sig Dispense Refill Last Dose  . aspirin 81 MG chewable tablet Chew 1 tablet (81 mg total) by mouth daily. 30 tablet 0 03/16/2020 at Unknown time  . atorvastatin (LIPITOR) 80 MG tablet Take 1 tablet (80 mg total) by mouth daily. 30 tablet 0 03/16/2020 at Unknown time  . metFORMIN (GLUCOPHAGE-XR) 500 MG 24 hr tablet Take 1,000 mg by mouth daily with breakfast.   03/16/2020 at Unknown time  . metoprolol tartrate (LOPRESSOR) 25 MG tablet Take 0.5 tablets (12.5 mg total) by mouth 2 (two) times daily. 30 tablet 0 03/16/2020 at  Unknown time  . potassium chloride SA (KLOR-CON) 20 MEQ tablet Take 1 tablet (20 mEq total) by mouth daily for 7 days. 7 tablet 0 03/16/2020 at Unknown time  . tamsulosin (FLOMAX) 0.4 MG CAPS capsule Take 0.4 mg by mouth daily.   03/16/2020 at Unknown time  . ticagrelor (BRILINTA) 90 MG TABS tablet Take 1 tablet (90 mg total) by mouth 2 (two) times daily. 60 tablet 0 03/16/2020 at Unknown time  . predniSONE (DELTASONE) 20 MG tablet prednisone 20 mg tablet (Patient not taking: Reported on 02/29/2020)   Not Taking at Unknown time  . Tadalafil 2.5 MG TABS Take 1 tablet by mouth daily. (Patient not taking: Reported on 03/01/2020)  Not Taking at Unknown time    Assessment: Baseline labs ordered.  No anticoagulants PTA per med list.  Asked to initiate Heparin for ACS w/o bolus.  7/8 1412 HL 0.33  7/8 2129 HL 0.28, slightly subtherapeutic  Goal of Therapy:  Heparin level 0.3-0.7 units/ml Monitor platelets by anticoagulation protocol: Yes   Plan:  Heparin level is slightly subtherapeutic. Will increase heparin rate to 1600 units/hr. Reheck anti-xa level 6 hours after increase. CBC daily while on heparin.   Wayland Denis, PharmD 03/17/2020,11:57 PM

## 2020-03-17 NOTE — Consult Note (Addendum)
Cardiology Consultation:   Patient ID: Joshua Holder MRN: [id number]; DOB: Feb 17, 1966  Admit date: 03/17/2020 Date of Consult: 03/17/2020  Primary Care Provider: System, Provider Not In Primary Cardiologist: Lorine Bears, MD  Primary Electrophysiologist:  None    Patient Profile:   Joshua Holder is a 54 y.o. male with a hx of CAD (pRCA s/p PTCA without PCI / stenting in setting thrombus and aneurysmal dz) and late presenting inferior STEMI with post MI course complicated by brief A. fib with RVR following catheterization, diabetes mellitus, essential hypertension, gout, and who is being seen today for the evaluation of elevated troponin and chest discomfort at the request of Dr. Myriam Forehand.  History of Present Illness:   Joshua Holder is a 54 year old male with PMH as above.  Prior to his most recent admission with late presenting inferior STEMI, he had no prior cardiac history.  He presented to [facility] ED 02/29/2020 with report of chest pain that started more than 24 hours prior.  CP was rated 6/7 out of 10.  It was described as substernal tightness radiating to the right and left side.  This CP first was intermittent but then became continuous.  Associated symptoms included fatigue and shortness of breath.  EKG at presentation showed inferior Q waves and minor 1 mm inferior ST elevation.  First high-sensitivity troponin greater than 20,000.  Presentation was consistent with late presenting inferior STEMI.  Initial plan was to treat him medically with cardiac catheterization the following day; however, his chest pain worsened and was associated with diaphoresis and recommendation changed to emergent cardiac catheterization.  02/29/2020 LHC showed occluded pRCA with L-R collaterals.  Balloon angioplasty was performed on the RCA but revealed vessel as aneurysmal with large amount of thrombus.  It was felt that RCA PCI and stent placement would have low yield of success and significant risks of distal  embolization and worsening myocardial infarction.  He was thus started on Aggrastat and oral DAPT.  It was noted that, if he had recurrent symptoms, relook angiography could be considered during hospitalization.  It was noted he was at high risk for mechanical complications and RV failure.  LVEDP was moderately elevated but diuresis avoided given the possibility of RV failure.  It was recommended he be monitored clinically.  Given his ACE allergy, he was started on ARB.  He was discharged on Brilinta, ARB, BB, and atorvastatin. Colchicine 0.6mg  BID was continued for possible pericarditis in the setting of late presenting MI. He has Afib s/p MI but anticoagulation deferred due to the fact that it was short lived.   Since that time, he reports medication compliance. He does report a slight cough that started in the hospital and worsens when laying flat. In addition to orthopnea, he notes increasing episodes of PND. He denies any LEE, abdominal distention, wt gain, or early satiety.   He then had a "gout flare" and reportedly took prednisone and received a short of cortisone. That evening, he awoke with racing heart rate and chest tightness, rated 7/10 and constant. His wife listened to his heart and heard that his "heart did not sound right, and stated he should go to the ED." Assoicated sx included SOB. He denied palpitations, n/v, emesis, presyncope, or syncope. He reports today that his chest tightness was different from that felt before his cardiac catheterization, given it was more tightness than pain.   In the [facility] ED, vitals significant for HR 171, BP 117/89,and SpO2 100% ORA. EKG showed SVT with diffuse ST/T  changes noted in the inferior, lateral, and precordial leads and seen during his previous admission but more pronounced with elevated rate. Also noted was prolonged QTc. Per ED MD, subsequent EKG showed Aflutter; however, I am unable to find this EKG on review of EMR. Labs showed elevated HS Tn,  hypomagnesemia, hyperglycemia, AKI, and anemia (chronic). HS Tn 110  575, Mg 1.4, K+ 4.2 (with consideration of hyperglycemia), Na 133 (corrected Na with hyperglycemia 136), Cr 1.45, BUN 23, LFTs wnl, glucose 316, Hgb 12.5 and HCT 36.8, TSH 0.786.   He was started on IV heparin. Sx persisted through adenosine x3 but resolved s/p start of diltiazem drip, at which time he reported complete alleviation of CP, SOB, and racing HR. At the time of his consultation, rate had improved from 181bpm to 90s-low 100s at rest. No CP.  Heart Pathway Score:     Past Medical History:  Diagnosis Date  . 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 Gout   . Hypertension   . MI (myocardial infarction) [facility])     Past Surgical History:  Procedure Laterality Date  . CORONARY/GRAFT ACUTE MI REVASCULARIZATION N/A 03/01/2020   Procedure: Coronary/Graft Acute MI Revascularization;  Surgeon: Iran Ouch, MD;  Location: [facility] INVASIVE CV LAB;  Service: Cardiovascular;  Laterality: N/A;  . INCISION AND DRAINAGE Left 12/30/2018   Procedure: INCISION AND DRAINAGE LEFT ELBOW;  Surgeon: Juanell Fairly, MD;  Location: [facility] ORS;  Service: Orthopedics;  Laterality: Left;  . LEFT HEART CATH AND CORONARY ANGIOGRAPHY N/A 03/01/2020   Procedure: LEFT HEART CATH AND CORONARY ANGIOGRAPHY;  Surgeon: Iran Ouch, MD;  Location: [facility] INVASIVE CV LAB;  Service: Cardiovascular;  Laterality: N/A;     Home Medications:  Prior to Admission medications   Medication Sig Start Date End Date Taking? Authorizing Provider  aspirin 81 MG chewable tablet Chew 1 tablet (81 mg total) by mouth daily. 03/05/20 04/04/20 Yes Dorcas Carrow, MD  atorvastatin (LIPITOR) 80 MG tablet Take 1 tablet (80 mg total) by mouth daily. 03/05/20 04/04/20 Yes Ghimire, Lyndel Safe, MD  metFORMIN (GLUCOPHAGE-XR) 500 MG 24 hr tablet Take 1,000 mg by mouth daily with breakfast.   Yes [provider]  metoprolol tartrate (LOPRESSOR) 25 MG tablet Take 0.5 tablets  (12.5 mg total) by mouth 2 (two) times daily. 03/04/20 04/03/20 Yes Ghimire, Lyndel Safe, MD  potassium chloride SA (KLOR-CON) 20 MEQ tablet Take 1 tablet (20 mEq total) by mouth daily for 7 days. 03/04/20 03/17/20 Yes Ghimire, Lyndel Safe, MD  tamsulosin (FLOMAX) 0.4 MG CAPS capsule Take 0.4 mg by mouth daily. 11/07/19  Yes [provider]  ticagrelor (BRILINTA) 90 MG TABS tablet Take 1 tablet (90 mg total) by mouth 2 (two) times daily. 03/04/20 04/03/20 Yes Ghimire, Lyndel Safe, MD  predniSONE (DELTASONE) 20 MG tablet prednisone 20 mg tablet Patient not taking: Reported on 02/29/2020    [provider]  Tadalafil 2.5 MG TABS Take 1 tablet by mouth daily. Patient not taking: Reported on 03/01/2020 02/19/20   [provider]    Inpatient Medications: Scheduled Meds: . aspirin  81 mg Oral Daily  . atorvastatin  80 mg Oral Daily  . insulin aspart  0-15 Units Subcutaneous TID WC  . insulin aspart  0-5 Units Subcutaneous QHS  . metoprolol tartrate  12.5 mg Oral BID   Continuous Infusions: . diltiazem (CARDIZEM) infusion 5 mg/hr (03/17/20 0550)  . heparin 1,400 Units/hr (03/17/20 0708)  . magnesium sulfate bolus IVPB 2 g (03/17/20 0840)  PRN Meds: acetaminophen, nitroGLYCERIN, ondansetron (ZOFRAN) IV  Allergies:    Allergies  Allergen Reactions  . Lisinopril Swelling and Cough    Social History:   Social History   Socioeconomic History  . Marital status: Single    Spouse name: Not on file  . Number of children: Not on file  . Years of education: Not on file  . Highest education level: Not on file  Occupational History  . Not on file  Tobacco Use  . Smoking status: Never Smoker  . Smokeless tobacco: Never Used  Substance and Sexual Activity  . Alcohol use: Yes    Comment: social  . Drug use: Not Currently  . Sexual activity: Yes  Other Topics Concern  . Not on file  Social History Narrative  . Not on file   Social Determinants of Health   Financial Resource Strain:    . Difficulty of Paying Living Expenses:   Food Insecurity:   . Worried About Programme researcher, broadcasting/film/video in the Last Year:   . Barista in the Last Year:   Transportation Needs:   . Freight forwarder (Medical):   Marland Kitchen Lack of Transportation (Non-Medical):   Physical Activity:   . Days of Exercise per Week:   . Minutes of Exercise per Session:   Stress:   . Feeling of Stress :   Social Connections:   . Frequency of Communication with Friends and Family:   . Frequency of Social Gatherings with Friends and Family:   . Attends Religious Services:   . Active Member of Clubs or Organizations:   . Attends Banker Meetings:   Marland Kitchen Marital Status:   Intimate Partner Violence:   . Fear of Current or Ex-Partner:   . Emotionally Abused:   Marland Kitchen Physically Abused:   . Sexually Abused:     Family History:   History reviewed. No pertinent family history.   ROS:  Please see the history of present illness.  Review of Systems  Constitutional: Positive for malaise/fatigue.  Respiratory: Positive for cough and shortness of breath. Negative for hemoptysis.   Cardiovascular: Positive for chest pain, orthopnea and PND. Negative for palpitations and leg swelling.  Gastrointestinal: Negative for blood in stool and melena.  Musculoskeletal: Negative for falls.  Neurological: Negative for dizziness and loss of consciousness.  All other systems reviewed and are negative.   All other ROS reviewed and negative.     Physical Exam/Data:   Vitals:   03/17/20 0342 03/17/20 0630 03/17/20 0742 03/17/20 0800  BP:  120/85 140/89 (!) 140/94  Pulse: 98 87 90 (!) 108  Resp: 20 (!) 21 (!) 21 (!) 21  Temp:      SpO2: 100% 97% 96% 100%  Weight:      Height:        Intake/Output Summary (Last 24 hours) at 03/17/2020 0850 Last data filed at 03/17/2020 0550 Gross per 24 hour  Intake 1021.11 ml  Output --  Net 1021.11 ml   Last 3 Weights 03/17/2020 03/01/2020 02/29/2020  Weight (lbs) 258 lb 265 lb 14  oz 280 lb  Weight (kg) 117.028 kg 120.6 kg 127.007 kg     Body mass index is 35.98 kg/m.  General:  Well nourished, well developed, in no acute distress HEENT: normal Neck: JVD difficult to assess 2/2 body habitus Vascular: No carotid bruits; radial pulses 2+ bilaterally Cardiac:  normal S1, S2; tachycardic but regular with intermittent extrasystole appreciated; no murmur Lungs:  Slightly  reduced bibasilar breath sounds with R>L  Abd: soft, nontender, no hepatomegaly  Ext: mild non-pitting pre-tibial edema Musculoskeletal:  No deformities, BUE and BLE strength normal and equal Skin: warm and dry  Neuro:  No focal abnormalities noted Psych:  Normal affect   EKG:  The EKG was personally reviewed and demonstrates:  EKG showed SVT with diffuse ST/T changes noted in the inferior, lateral, and precordial leads and seen during his previous admission but more pronounced with elevated rate.  Telemetry:  Telemetry was personally reviewed and demonstrates:  SVT, PVCs, rates 100s-160s  Relevant CV Studies: LHC 03/01/2020:  LV end diastolic pressure is moderately elevated.  The left ventricular ejection fraction is 35-45% by visual estimate.  There is moderate left ventricular systolic dysfunction.  Prox RCA to Mid RCA lesion is 100% stenosed.  Post intervention, there is a 100% residual stenosis.  Balloon angioplasty was performed using a BALLOON TREK RX 2.5X15.  Mid LAD lesion is 40% stenosed.  1. Severe one-vessel coronary artery disease with thrombotic occlusion of the proximal right coronary artery with reasonable left to right collaterals from the LAD. 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 2. Moderately reduced LV systolic function with an EF of 35 to 40% with inferior wall akinesis. 3. Moderately elevated left ventricular end-diastolic pressure at 27 mmHg. 4. Balloon angioplasty of the right coronary artery only established  TIMI I flow and this revealed that the RCA was aneurysmal with massive amount of thrombus. I felt that continued intervention would carry a significant risk of no reflow due to distal embolization and that would risk the already developed collaterals.  Recommendations: This was a case of inferior ST elevation myocardial infarction with late presentation after more than 24 hours of symptoms. Initial plan was to treat him medically and consider angiography in the morning but then his chest pain worsened with diaphoresis and I elected to proceed with emergent cardiac catheterization with the above results. The patient was started on Aggrastat to be continued for 18 hours. I started the patient on oral dual antiplatelet therapy with aspirin and ticagrelor. If the patient remains clinically stable, no plans for repeat angiography and the plan is to treat him medically assuming that his left-to-right collaterals will continue to develop. If in the other hand he has recurrent symptoms or instability, relook angiography can be considered after the patient has been treated with intravenous and oral antiplatelet medications. The patient is at high risk for mechanical complications and right ventricular failure. __________  2D echo 03/01/2020: 1. Left ventricular ejection fraction, by estimation, is 50 to 55%. The  left ventricle has low normal function. The left ventricle demonstrates  regional wall motion abnormalities (see scoring diagram/findings for  description). There is mild left  ventricular hypertrophy. Left ventricular diastolic parameters were  normal. There is severe hypokinesis of the left ventricular, basal-mid  inferior wall and inferoseptal wall.  2. Right ventricular systolic function is normal. The right ventricular  size is not well visualized. There is normal pulmonary artery systolic  pressure.  3. The mitral valve is normal in structure. Trivial mitral valve  regurgitation. No  evidence of mitral stenosis.  4. The aortic valve is tricuspid. Aortic valve regurgitation is not  visualized. No aortic stenosis is present.  5. The inferior vena cava is dilated in size with <50% respiratory  variability, suggesting right atrial pressure of 15 mmHg.  Laboratory Data:  High Sensitivity Troponin:   Recent Labs  Lab 02/29/20 2355 03/01/20 0202 03/01/20 0409 03/17/20 0329 03/17/20 0605  TROPONINIHS 21,388* 19,065* 17,858* 110* 575*     Cardiac EnzymesNo results for input(s): TROPONINI in the last 168 hours. No results for input(s): TROPIPOC in the last 168 hours.  Chemistry Recent Labs  Lab 03/17/20 0329  NA 133*  K 4.2  CL 99  CO2 22  GLUCOSE 316*  BUN 23*  CREATININE 1.45*  CALCIUM 9.2  GFRNONAA 55*  GFRAA >60  ANIONGAP 12    Recent Labs  Lab 03/17/20 0329  PROT 7.6  ALBUMIN 3.7  AST 16  ALT 17  ALKPHOS 78  BILITOT 1.4*   Hematology Recent Labs  Lab 03/17/20 0329  WBC 7.6  RBC 4.43  HGB 12.5*  HCT 36.8*  MCV 83.1  MCH 28.2  MCHC 34.0  RDW 12.5  PLT 394   BNPNo results for input(s): BNP, PROBNP in the last 168 hours.  DDimer No results for input(s): DDIMER in the last 168 hours.   Radiology/Studies:  DG Chest Portable 1 View  Result Date: 03/17/2020 CLINICAL DATA:  Shortness of breath, sudden onset EXAM: PORTABLE CHEST 1 VIEW COMPARISON:  02/29/2020 FINDINGS: Artifact from EKG leads and defibrillator pads. Mild prominence of lung markings without Kerley lines, likely accentuated by lower volumes. No consolidation or pneumothorax. No pleural effusion. Normal heart size IMPRESSION: Negative portable chest. Electronically Signed   By: Marnee Spring M.D.   On: 03/17/2020 04:18    Assessment and Plan:   NSTEMI H/o CAD with recent 6/21 late presenting inferior MI --No current CP. Recent LHC after late presenting inferior STEMI as outlined in HPI above.  Consider elevated HS Tn in the setting of rapid rate and AKI; however,  recommend continue to cycle given cath as above with recommendation for relook angiography consideration if repeat sx. He does state today that his sx are different than that of his previous inferior STEMI, but cannot completely exclude at this time. Also considered is recent suspected pericarditis. --Repeat echo. Previous echo as above with low normal EF 50-55%; however, high risk for RV failure. --Ordered additional HS Tn. Cycle until peaked and down-trending. --Continue IV heparin. --Discontinue IV diltiazem given low normal EF and contraindicated. Goal HR below 110bpm.   --Increase dose of BB. Up-titrate as BP allows. --Continue Brilinta, BB, nitro, and statin. Discontinue ASA to avoid triple therapy. Continue PRN IV metoprolol as needed for additional rate control.  --Continue NPO status pending MD to see patient.  --Further recommendations pending echo results. We will continue to follow closely.   SVT --Consider as 2/2 recent prednisone and corticosteroid use. S/p adenosine x3. As above, will discontinue IV diltiazem given reduced EF. Will increase BB to metoprolol 25mg  BID and continue to up-titrate as needed for rate control as BP allows. Goal HR below 110bpm. Recommend electrolyte repletion as below with low Mg. TSH wnl. Echo pending. Given h/o Afib after MI, monitor closely for repeat Afib and continue IV heparin. Will likely need Zio at discharge and reconsideration of oral anticoagulation at discharge if Afib seen this admission.   HFrEF (EF 50-55%) --SOB improved at this time. 6/22 EF 50-55%. LVEDP moderately elevated by diuresis avoided given possibility of RV failure. --Repeat echo, given high risk of RV failure with new sx of orthopnea and PND reported. Consider new sx 2/2 HFrEF exacerbation in the setting of rapid rate.  --Monitor clinically.  --Monitor I/Os, daily weights. Daily BMET. --Defer start of IV lasix at this time and  pending echo results / reassessment of RV. ACEi  allergy, angioedema. ARB held 2/2 AKI / soft BP at this time. Continue BB as BP allows.  HTN --BP soft. Continue to monitor with escalation of rate control / BB to avoid pre-renal AKI. ARB held as above. No ACE 2/2 allergy.  AKI --Daily BMET. Cr 1.45. Baseline Cr 1.15.  Caution with nephrotoxins. Avoid pre-renal AKI - closely monitor BP with rate control.   Electrolyte abnormalities, hypomagnesemia --Daily BMET.  --Replete Mg as needed with Goal 2.0.  --Calculated corrected Na 2/2 hyperglycemia   corrected value is 136. --Consider that K+ may be lower than resulted 2/2 glucose of 316. Goal K 4.0.  DM2 --SSI, Per IM. As above, corrected Na (due to hyperglycemia) = 136. Consider that resulted K may be lower than reported due to hyperglycemia with pt now on SSI.   Anemia --Appears chronic on review of previous labs. Monitor as on heparin and Brilinta. No reported s/sx of bleeding.  Possible Pericarditis --As per most recent admission, discharged on colchicine for possible pericarditis. Continue to hold for now given AKI.   HLD --Continue statin.  For questions or updates, please contact [facility] Please consult [email or web address] for contact info under     Signed, Lennon Alstrom, PA-C  03/17/2020 8:50 AM

## 2020-03-17 NOTE — ED Provider Notes (Signed)
-----------------------------------------   8:03 AM on 03/17/2020 -----------------------------------------  I was notified by nursing that patient had rolled over in his sleep and suddenly developed worsening tachycardia consistent with atrial fibrillation with RVR.  Patient denies any chest pain or shortness of breath, continues to be on Cardizem drip and seems to have converted to normal sinus rhythm within the next 1 to 2 minutes.  Initial EKG performed during episode of A. fib with RVR read by computer as "acute MI", however does not meet criteria by my read.  Repeat EKG shows no acute ischemic changes and patient continues to deny chest pain.  ED ECG REPORT I, Chesley Noon, the attending physician, personally viewed and interpreted this ECG.   Date: 03/17/2020  EKG Time: 7:58  Rate: 132  Rhythm: atrial fibrillation, rate 132  Axis: Normal  Intervals:none  ST&T Change: Inferior Q waves  ED ECG REPORT I, Chesley Noon, the attending physician, personally viewed and interpreted this ECG.   Date: 03/17/2020  EKG Time: 8:00  Rate: 90  Rhythm: normal sinus rhythm  Axis: Normal  Intervals:none  ST&T Change: Inferior Q waves     Chesley Noon, MD 03/17/20 [phone]

## 2020-03-17 NOTE — Progress Notes (Signed)
*  PRELIMINARY RESULTS* Echocardiogram 2D Echocardiogram has been performed.  Cristela Blue 03/17/2020, 2:06 PM

## 2020-03-17 NOTE — Plan of Care (Signed)
  Problem: Education: Goal: Knowledge of General Education information will improve Description: Including pain rating scale, medication(s)/side effects and non-pharmacologic comfort measures Outcome: Progressing   Problem: Clinical Measurements: Goal: Ability to maintain clinical measurements within normal limits will improve Outcome: Progressing Goal: Will remain free from infection Outcome: Progressing Goal: Cardiovascular complication will be avoided Outcome: Progressing Note: Patient has not complain of chest pain. He is on a heparin gtt

## 2020-03-17 NOTE — ED Notes (Signed)
Pt was sleeping and stated he rolled over when his HR jumped to 170-180s on the monitor and was sustained for 30-60secs. Pt denies any complaints at this time. This Water engineer at bedside. Repeat EKG done and given to EDP. Admitting ED made aware.

## 2020-03-17 NOTE — ED Notes (Signed)
Pt transported to hospital bed at this time. Pt denies any further needs at this time.

## 2020-03-17 NOTE — Progress Notes (Addendum)
     Progress Note    Joshua Holder  ZOX:[id number] DOB: [birth year]  DOA: 03/17/2020 PCP: System, Provider Not In        Assessment/Plan:   Principal Problem:   NSTEMI (non-ST elevated myocardial infarction) (HCC) Active Problems:   DM (diabetes mellitus) (HCC)   HTN (hypertension)   Gout   Atrial fibrillation with RVR (HCC)   AKI (acute kidney injury) (HCC)   Atrial flutter (HCC)   Demand ischemia (HCC)     No complaints.  No chest pain, palpitations, dizziness or shortness of breath.  He feels better.  Patient is morbidly obese otherwise physical exam is unremarkable.  Elevated troponin with upward trend, peaked at 1,927.  Continue metoprolol, Lipitor, Brilinta and IV heparin infusion.  Replete magnesium with magnesium sulfate.  Case was discussed with cardiologist, Dr. Mariah Milling    LOS: 0 days   Lurene Shadow  [facility]     03/17/2020, 4:24 PM

## 2020-03-18 ENCOUNTER — Telehealth: Payer: Self-pay | Admitting: Physician Assistant

## 2020-03-18 DIAGNOSIS — M109 Gout, unspecified: Secondary | ICD-10-CM

## 2020-03-18 DIAGNOSIS — I4892 Unspecified atrial flutter: Secondary | ICD-10-CM

## 2020-03-18 DIAGNOSIS — I214 Non-ST elevation (NSTEMI) myocardial infarction: Secondary | ICD-10-CM | POA: Diagnosis not present

## 2020-03-18 DIAGNOSIS — N179 Acute kidney failure, unspecified: Secondary | ICD-10-CM

## 2020-03-18 DIAGNOSIS — E1159 Type 2 diabetes mellitus with other circulatory complications: Secondary | ICD-10-CM | POA: Diagnosis not present

## 2020-03-18 DIAGNOSIS — I248 Other forms of acute ischemic heart disease: Secondary | ICD-10-CM | POA: Diagnosis not present

## 2020-03-18 LAB — BASIC METABOLIC PANEL
Anion gap: 9 (ref 5–15)
BUN: 22 mg/dL — ABNORMAL HIGH (ref 6–20)
CO2: 23 mmol/L (ref 22–32)
Calcium: 8.5 mg/dL — ABNORMAL LOW (ref 8.9–10.3)
Chloride: 104 mmol/L (ref 98–111)
Creatinine, Ser: 1 mg/dL (ref 0.61–1.24)
GFR calc Af Amer: >60 mL/min (ref >60)
GFR calc non Af Amer: >60 mL/min (ref >60)
Glucose, Bld: 275 mg/dL — ABNORMAL HIGH (ref 70–99)
Potassium: 3.9 mmol/L (ref 3.5–5.1)
Sodium: 136 mmol/L (ref 135–145)

## 2020-03-18 LAB — GLUCOSE, CAPILLARY
Glucose-Capillary: 261 mg/dL — ABNORMAL HIGH (ref 70–99)
Glucose-Capillary: 158 mg/dL — ABNORMAL HIGH (ref 70–99)
Glucose-Capillary: 170 mg/dL — ABNORMAL HIGH (ref 70–99)

## 2020-03-18 LAB — CBC
HCT: 33.5 % — ABNORMAL LOW (ref 39.0–52.0)
Hemoglobin: 11.3 g/dL — ABNORMAL LOW (ref 13.0–17.0)
MCH: 27.9 pg (ref 26.0–34.0)
MCHC: 33.7 g/dL (ref 30.0–36.0)
MCV: 82.7 fL (ref 80.0–100.0)
Platelets: 320 10*3/uL (ref 150–400)
RBC: 4.05 MIL/uL — ABNORMAL LOW (ref 4.22–5.81)
RDW: 13.2 % (ref 11.5–15.5)
WBC: 7.7 10*3/uL (ref 4.0–10.5)
nRBC: 0 % (ref 0.0–0.2)

## 2020-03-18 LAB — HEPARIN LEVEL (UNFRACTIONATED)
Heparin Unfractionated: 0.41 IU/mL (ref 0.30–0.70)
Heparin Unfractionated: 0.39 IU/mL (ref 0.30–0.70)

## 2020-03-18 LAB — MAGNESIUM: Magnesium: 1.6 mg/dL — ABNORMAL LOW (ref 1.7–2.4)

## 2020-03-18 MED ORDER — ASPIRIN EC 81 MG PO TBEC
81.0000 mg | DELAYED_RELEASE_TABLET | Freq: Every day | ORAL | Status: DC
  Administered 2020-03-18: 81 mg via ORAL
  Filled 2020-03-18: qty 1

## 2020-03-18 MED ORDER — MAGNESIUM SULFATE 2 GM/50ML IV SOLN
2.0000 g | Freq: Once | INTRAVENOUS | Status: AC
  Administered 2020-03-18: 2 g via INTRAVENOUS
  Filled 2020-03-18: qty 50

## 2020-03-18 MED ORDER — METOPROLOL TARTRATE 25 MG PO TABS: 25.0000 mg | ORAL_TABLET | Freq: Two times a day (BID) | ORAL | 0 refills | Status: DC

## 2020-03-18 MED ORDER — COLCHICINE 0.6 MG PO TABS
1.2000 mg | ORAL_TABLET | Freq: Once | ORAL | Status: AC
  Administered 2020-03-18: 1.2 mg via ORAL
  Filled 2020-03-18: qty 2

## 2020-03-18 MED ORDER — ASPIRIN EC 81 MG PO TBEC: 81.0000 mg | DELAYED_RELEASE_TABLET | Freq: Every day | ORAL | Status: DC

## 2020-03-18 MED ORDER — COLCHICINE 0.6 MG PO TABS
0.6000 mg | ORAL_TABLET | Freq: Every day | ORAL | 0 refills | Status: DC
Start: 2020-03-18 — End: 2020-05-11

## 2020-03-18 MED ORDER — NITROGLYCERIN 0.4 MG SL SUBL
0.4000 mg | SUBLINGUAL_TABLET | SUBLINGUAL | 0 refills | Status: DC | PRN
Start: 2020-03-18 — End: 2021-03-17

## 2020-03-18 MED ORDER — COLCHICINE 0.6 MG PO TABS
0.6000 mg | ORAL_TABLET | Freq: Once | ORAL | Status: AC | PRN
  Administered 2020-03-18: 0.6 mg via ORAL
  Filled 2020-03-18: qty 1

## 2020-03-18 NOTE — Progress Notes (Signed)
Inpatient Diabetes Program Recommendations  AACE/ADA: New Consensus Statement on Inpatient Glycemic Control (2015)  Target Ranges:  Prepandial:   less than 140 mg/dL      Peak postprandial:   less than 180 mg/dL (1-2 hours)      Critically ill patients:  140 - 180 mg/dL   Lab Results  Component Value Date   GLUCAP 158 (H) 03/18/2020   HGBA1C 7.0 (H) 03/01/2020    Review of Glycemic Control Results for Joshua Holder, Joshua Holder (MRN [id number]) as of 03/18/2020 13:02  Ref. Range 03/17/2020 08:43 03/17/2020 11:14 03/17/2020 16:55 03/17/2020 21:05 03/18/2020 07:47 03/18/2020 11:45  Glucose-Capillary Latest Ref Range: 70 - 99 mg/dL 191 (H) 660 (H) 600 (H) 229 (H) 261 (H) 158 (H)   Diabetes history: DM2 Outpatient Diabetes medications: Metformin 1 gm qd Current orders for Inpatient glycemic control: Novolog moderate correction tid  Inpatient Diabetes Program Recommendations:   While Metformin on hold, Consider Lantus 12 units daily (0.1 unit/kg x 117.1 kg)  Thank you, Darel Hong E. Nikita Humble, RN, MSN, CDE  Diabetes Coordinator Inpatient Glycemic Control Team Team Pager [phone] (8am-5pm) 03/18/2020 1:07 PM

## 2020-03-18 NOTE — Progress Notes (Signed)
Ch visited with Pt in response to OR for AD. Upon arrival one MD was leaving and another coming in. [facility] asked if Pt wanted to complete AD, and he let Ch know that he does not know when in the day his wife will be coming in. Ch asked if he would like to hold onto AD packet to look it over, and Pt said yes. Ch left AD packet with Pt; Ch will ask other chaplains to follow up on AD completion.

## 2020-03-18 NOTE — Progress Notes (Signed)
Patient ride arrived. Patient off unit.Care relinquished.

## 2020-03-18 NOTE — Discharge Summary (Addendum)
Physician Discharge Summary  Joshua Holder GEX:[id number] DOB: [birth year] DOA: 03/17/2020  PCP: System, Provider Not In  Admit date: 03/17/2020 Discharge date: 03/18/2020  Discharge disposition: Home   Recommendations for Outpatient Follow-Up:   Follow-up with PCP in 1 week Follow-up with cardiologist   Discharge Diagnosis:   Principal Problem:   NSTEMI (non-ST elevated myocardial infarction) (HCC) Active Problems:   DM (diabetes mellitus) (HCC)   HTN (hypertension)   Gout   Atrial fibrillation with RVR (HCC)   AKI (acute kidney injury) (HCC)   Atrial flutter (HCC)   Demand ischemia (HCC)    Discharge Condition: Stable.  Diet recommendation:  Diet Order            Diet - low sodium heart healthy           Diet Carb Modified                       Code Status: Full Code     Hospital Course:   [name] is a 54 year old man with medical history significant for diabetes, hypertension, recent STEMI on February 29, 2020 treated with Aggrastat (no stent, and discharged on aspirin and Brilinta) who presented to the hospital with sudden onset of severe retrosternal chest pain.  He said he has been using prednisone recently for gout.  He is taking 60 mg of prednisone the day before admission and is not sure if this precipitated his symptoms.  He was found to have atrial flutter with rapid ventricular response and elevated troponins. He was treated with IV heparin infusion and cardizem infusion. He was seen in consultation by the cardiologist. Elevated troponins were thought to be due to demand ischemia from tachycardia.  His metoprolol was increased.  Heart rate has improved.  He developed acute gout flare of the left wrist joint.  He was treated with 2 doses of colchicine with improvement in his symptoms.  He said he had stopped taking allopurinol for a month.  He was supposed to see his PCP for refills but he never followed up for this.  Colchicine was prescribed  at discharge.  He said he had not been able to use colchicine in the past because it was too expensive.  He is okay with few pills of colchicine.  He has been advised to follow-up with PCP for further evaluation management.  He is deemed stable for discharge to home.  Discharge plan discussed with the patient.  Discharge plan was also discussed with cardiologist, Dr. Mariah Milling      Discharge Exam:   Vitals:   03/18/20 1142 03/18/20 1615  BP: 121/84 114/88  Pulse: 78 66  Resp: 17 19  Temp: 98 F (36.7 C) (!) 97.5 F (36.4 C)  SpO2: 99% 100%   Vitals:   03/18/20 0601 03/18/20 0746 03/18/20 1142 03/18/20 1615  BP: 116/89 115/77 121/84 114/88  Pulse: 85 87 78 66  Resp: 16 18 17 19   Temp: 98 F (36.7 C) 97.9 F (36.6 C) 98 F (36.7 C) (!) 97.5 F (36.4 C)  TempSrc:  Oral Oral Oral  SpO2: 100% 100% 99% 100%  Weight:      Height:         GEN: NAD SKIN: No rash EYES: EOMI ENT: MMM CV: RRR PULM: CTA B ABD: soft, obese, NT, +BS CNS: AAO x 3, non focal EXT: Mild swelling of the dorsal aspect of the left wrist with associated tenderness.  No erythema  noted.   The results of significant diagnostics from this hospitalization (including imaging, microbiology, ancillary and laboratory) are listed below for reference.     Procedures and Diagnostic Studies:   DG Chest Portable 1 View  Result Date: 03/17/2020 CLINICAL DATA:  Shortness of breath, sudden onset EXAM: PORTABLE CHEST 1 VIEW COMPARISON:  02/29/2020 FINDINGS: Artifact from EKG leads and defibrillator pads. Mild prominence of lung markings without Kerley lines, likely accentuated by lower volumes. No consolidation or pneumothorax. No pleural effusion. Normal heart size IMPRESSION: Negative portable chest. Electronically Signed   By: Marnee Spring M.D.   On: 03/17/2020 04:18   ECHOCARDIOGRAM COMPLETE  Result Date: 03/17/2020    ECHOCARDIOGRAM REPORT   Patient Name:   Joshua Holder Date of Exam: 03/17/2020 Medical Rec #:   [id number]       Height:       71.0 in Accession #:    [id number]      Weight:       258.0 lb Date of Birth:  1966/08/05      BSA:          2.350 m Patient Age:    53 years        BP:           129/85 mmHg Patient Gender: M               HR:           67 bpm. Exam Location:  [facility] Procedure: 2D Echo, Cardiac Doppler and Color Doppler Indications:     CHF 428.0  History:         Patient has prior history of Echocardiogram examinations, most                  recent 03/01/2020. Risk Factors:Hypertension and Diabetes. MI.  Sonographer:     Cristela Blue RDCS (AE) Referring Phys:  [id number] Lennon Alstrom Diagnosing Phys: Julien Nordmann MD  Sonographer Comments: Technically challenging study due to limited acoustic windows. IMPRESSIONS  1. Left ventricular ejection fraction, by estimation, is 50%. The left ventricle has low normal function. Inferior wall hypokinesis. There is moderate left ventricular hypertrophy. Left ventricular diastolic parameters are consistent with Grade II diastolic dysfunction (pseudonormalization).  2. Right ventricular systolic function is normal. The right ventricular size is normal. There is mildly elevated pulmonary artery systolic pressure. The estimated right ventricular systolic pressure is 38.1 mmHg.  3. Mild to moderate mitral valve regurgitation. FINDINGS  Left Ventricle: Left ventricular ejection fraction, by estimation, is 50 to 55%. The left ventricle has low normal function. Inferior wall hypokinesis. The left ventricular internal cavity size was normal in size. There is moderate left ventricular hypertrophy. Left ventricular diastolic parameters are consistent with Grade II diastolic dysfunction (pseudonormalization). Right Ventricle: The right ventricular size is normal. No increase in right ventricular wall thickness. Right ventricular systolic function is normal. There is mildly elevated pulmonary artery systolic pressure. The tricuspid regurgitant velocity is 2.65  m/s, and with  an assumed right atrial pressure of 10 mmHg, the estimated right ventricular systolic pressure is 38.1 mmHg. Left Atrium: Left atrial size was normal in size. Right Atrium: Right atrial size was normal in size. Pericardium: There is no evidence of pericardial effusion. Mitral Valve: The mitral valve is normal in structure. Normal mobility of the mitral valve leaflets. Mild to moderate mitral valve regurgitation. No evidence of mitral valve stenosis. Tricuspid Valve: The tricuspid valve is normal in structure. Tricuspid valve regurgitation is mild .  No evidence of tricuspid stenosis. Aortic Valve: The aortic valve was not well visualized. Aortic valve regurgitation is mild. No aortic stenosis is present. Aortic valve mean gradient measures 2.5 mmHg. Aortic valve peak gradient measures 4.5 mmHg. Aortic valve area, by VTI measures 2.93  cm. Pulmonic Valve: The pulmonic valve was normal in structure. Pulmonic valve regurgitation is not visualized. No evidence of pulmonic stenosis. Aorta: The aortic root is normal in size and structure. Venous: The inferior vena cava is normal in size with greater than 50% respiratory variability, suggesting right atrial pressure of 3 mmHg. IAS/Shunts: No atrial level shunt detected by color flow Doppler.  LEFT VENTRICLE PLAX 2D LVIDd:         4.41 cm  Diastology LVIDs:         3.29 cm  LV e' lateral:   11.10 cm/s LV PW:         1.35 cm  LV E/e' lateral: 8.3 LV IVS:        1.10 cm  LV e' medial:    5.11 cm/s LVOT diam:     2.10 cm  LV E/e' medial:  18.1 LV SV:         57 LV SV Index:   24 LVOT Area:     3.46 cm  RIGHT VENTRICLE RV Basal diam:  4.37 cm RV S prime:     7.51 cm/s TAPSE (M-mode): 3.9 cm LEFT ATRIUM             Index       RIGHT ATRIUM           Index LA diam:        3.80 cm 1.62 cm/m  RA Area:     22.70 cm LA Vol (A2C):   60.6 ml 25.79 ml/m RA Volume:   69.50 ml  29.58 ml/m LA Vol (A4C):   42.6 ml 18.13 ml/m LA Biplane Vol: 52.7 ml 22.43 ml/m  AORTIC VALVE                    PULMONIC VALVE AV Area (Vmax):    2.57 cm    PV Vmax:        0.83 m/s AV Area (Vmean):   2.81 cm    PV Peak grad:   2.7 mmHg AV Area (VTI):     2.93 cm    RVOT Peak grad: 3 mmHg AV Vmax:           106.50 cm/s AV Vmean:          70.300 cm/s AV VTI:            0.195 m AV Peak Grad:      4.5 mmHg AV Mean Grad:      2.5 mmHg LVOT Vmax:         79.00 cm/s LVOT Vmean:        57.000 cm/s LVOT VTI:          0.165 m LVOT/AV VTI ratio: 0.85  AORTA Ao Root diam: 3.70 cm MITRAL VALVE               TRICUSPID VALVE MV Area (PHT): 3.99 cm    TR Peak grad:   28.1 mmHg MV Decel Time: 190 msec    TR Vmax:        265.00 cm/s MV E velocity: 92.50 cm/s MV A velocity: 55.70 cm/s  SHUNTS MV E/A ratio:  1.66        Systemic  VTI:  0.16 m                            Systemic Diam: 2.10 cm Julien Nordmann MD Electronically signed by Julien Nordmann MD Signature Date/Time: 03/17/2020/6:18:41 PM    Final      Labs:   Basic Metabolic Panel: Recent Labs  Lab 03/17/20 0329 03/18/20 0738  NA 133* 136  K 4.2 3.9  CL 99 104  CO2 22 23  GLUCOSE 316* 275*  BUN 23* 22*  CREATININE 1.45* 1.00  CALCIUM 9.2 8.5*  MG 1.4* 1.6*   GFR Estimated Creatinine Clearance: 112.4 mL/min (by C-G formula based on SCr of 1 mg/dL). Liver Function Tests: Recent Labs  Lab 03/17/20 0329  AST 16  ALT 17  ALKPHOS 78  BILITOT 1.4*  PROT 7.6  ALBUMIN 3.7   No results for input(s): LIPASE, AMYLASE in the last 168 hours. No results for input(s): AMMONIA in the last 168 hours. Coagulation profile Recent Labs  Lab 03/17/20 0304  INR 1.1    CBC: Recent Labs  Lab 03/17/20 0329 03/18/20 0738  WBC 7.6 7.7  NEUTROABS 6.8  --   HGB 12.5* 11.3*  HCT 36.8* 33.5*  MCV 83.1 82.7  PLT 394 320   Cardiac Enzymes: No results for input(s): CKTOTAL, CKMB, CKMBINDEX, TROPONINI in the last 168 hours. BNP: Invalid input(s): POCBNP CBG: Recent Labs  Lab 03/17/20 1655 03/17/20 2105 03/18/20 0747 03/18/20 1145 03/18/20 1617  GLUCAP  307* 229* 261* 158* 170*   D-Dimer No results for input(s): DDIMER in the last 72 hours. Hgb A1c No results for input(s): HGBA1C in the last 72 hours. Lipid Profile No results for input(s): CHOL, HDL, LDLCALC, TRIG, CHOLHDL, LDLDIRECT in the last 72 hours. Thyroid function studies Recent Labs    03/17/20 0329  TSH 0.786   Anemia work up No results for input(s): VITAMINB12, FOLATE, FERRITIN, TIBC, IRON, RETICCTPCT in the last 72 hours. Microbiology Recent Results (from the past 240 hour(s))  SARS Coronavirus 2 by RT PCR (hospital order, performed in [facility] hospital lab) Nasopharyngeal Nasopharyngeal Swab     Status: None   Collection Time: 03/17/20  6:05 AM   Specimen: Nasopharyngeal Swab  Result Value Ref Range Status   SARS Coronavirus 2 NEGATIVE NEGATIVE Final    Comment: (NOTE) SARS-CoV-2 target nucleic acids are NOT DETECTED.  The SARS-CoV-2 RNA is generally detectable in upper and lower respiratory specimens during the acute phase of infection. The lowest concentration of SARS-CoV-2 viral copies this assay can detect is 250 copies / mL. A negative result does not preclude SARS-CoV-2 infection and should not be used as the sole basis for treatment or other patient management decisions.  A negative result may occur with improper specimen collection / handling, submission of specimen other than nasopharyngeal swab, presence of viral mutation(s) within the areas targeted by this assay, and inadequate number of viral copies (<250 copies / mL). A negative result must be combined with clinical observations, patient history, and epidemiological information.  Fact Sheet for Patients:   BoilerBrush.com.cy  Fact Sheet for Healthcare Providers: https://pope.com/  This test is not yet approved or  cleared by the [place] FDA and has been authorized for detection and/or diagnosis of SARS-CoV-2 by FDA under an Emergency Use  Authorization (EUA).  This EUA will remain in effect (meaning this test can be used) for the duration of the COVID-19 declaration under Section 564(b)(1) of the  Act, 21 U.S.C. section 360bbb-3(b)(1), unless the authorization is terminated or revoked sooner.  Performed at [facility], [address] [place], [place] [address]      Discharge Instructions:   Discharge Instructions    Amb referral to [facility]   Complete by: As directed    Diet - low sodium heart healthy   Complete by: As directed    Diet Carb Modified   Complete by: As directed    Increase activity slowly   Complete by: As directed      Allergies as of 03/18/2020      Reactions   Lisinopril Swelling, Cough      Medication List    STOP taking these medications   potassium chloride SA 20 MEQ tablet Commonly known as: KLOR-CON   predniSONE 20 MG tablet Commonly known as: DELTASONE   Tadalafil 2.5 MG Tabs     TAKE these medications   aspirin 81 MG chewable tablet Chew 1 tablet (81 mg total) by mouth daily.   atorvastatin 80 MG tablet Commonly known as: LIPITOR Take 1 tablet (80 mg total) by mouth daily.   colchicine 0.6 MG tablet Take 1 tablet (0.6 mg total) by mouth daily for 10 days.   metFORMIN 500 MG 24 hr tablet Commonly known as: GLUCOPHAGE-XR Take 1,000 mg by mouth daily with breakfast.   metoprolol tartrate 25 MG tablet Commonly known as: LOPRESSOR Take 1 tablet (25 mg total) by mouth 2 (two) times daily. What changed: how much to take   nitroGLYCERIN 0.4 MG SL tablet Commonly known as: Nitrostat Place 1 tablet (0.4 mg total) under the tongue every 5 (five) minutes as needed for chest pain.   tamsulosin 0.4 MG Caps capsule Commonly known as: FLOMAX Take 0.4 mg by mouth daily.   ticagrelor 90 MG Tabs tablet Commonly known as: BRILINTA Take 1 tablet (90 mg total) by mouth 2 (two) times daily.         Time coordinating discharge: 34 minutes  Signed:  Jerlean Peralta  [facility] 03/18/2020, 6:46 PM

## 2020-03-18 NOTE — Progress Notes (Signed)
ANTICOAGULATION CONSULT NOTE  Pharmacy Consult for Heparin Indication: chest pain/ACS  Allergies  Allergen Reactions   Lisinopril Swelling and Cough    Patient Measurements: Height: 5\' 11"  (180.3 cm) Weight: 117.1 kg (258 lb 1.6 oz) IBW/kg (Calculated) : 75.3 HEPARIN DW (KG): 101  Vital Signs: Temp: 97.9 F (36.6 C) (07/09 0746) Temp Source: Oral (07/09 0746) BP: 115/77 (07/09 0746) Pulse Rate: 87 (07/09 0746)  Labs: Recent Labs    03/17/20 0304 03/17/20 0329 03/17/20 0605 03/17/20 1116 03/17/20 1118 03/17/20 1412 03/17/20 1544 03/17/20 2129 03/18/20 0738  HGB  --  12.5*  --   --   --   --   --   --  11.3*  HCT  --  36.8*  --   --   --   --   --   --  33.5*  PLT  --  394  --   --   --   --   --   --  320  APTT  --   --   --  46*  --   --   --   --   --   LABPROT 13.6  --   --   --   --   --   --   --   --   INR 1.1  --   --   --   --   --   --   --   --   HEPARINUNFRC  --   --   --   --   --  0.33  --  0.28* 0.41  CREATININE  --  1.45*  --   --   --   --   --   --  1.00  TROPONINIHS  --  110*   < >  --    < > 1,927* 1,654* 1,606*  --    < > = values in this interval not displayed.    Estimated Creatinine Clearance: 112.4 mL/min (by C-G formula based on SCr of 1 mg/dL).   Medical History: Past Medical History:  Diagnosis Date   Diabetes 1.5, managed as type 2 (HCC)    Gout    Hypertension    MI (myocardial infarction) (HCC)     Medications:  Medications Prior to Admission  Medication Sig Dispense Refill Last Dose   aspirin 81 MG chewable tablet Chew 1 tablet (81 mg total) by mouth daily. 30 tablet 0 03/16/2020 at Unknown time   atorvastatin (LIPITOR) 80 MG tablet Take 1 tablet (80 mg total) by mouth daily. 30 tablet 0 03/16/2020 at Unknown time   metFORMIN (GLUCOPHAGE-XR) 500 MG 24 hr tablet Take 1,000 mg by mouth daily with breakfast.   03/16/2020 at Unknown time   metoprolol tartrate (LOPRESSOR) 25 MG tablet Take 0.5 tablets (12.5 mg total) by  mouth 2 (two) times daily. 30 tablet 0 03/16/2020 at Unknown time   potassium chloride SA (KLOR-CON) 20 MEQ tablet Take 1 tablet (20 mEq total) by mouth daily for 7 days. 7 tablet 0 03/16/2020 at Unknown time   tamsulosin (FLOMAX) 0.4 MG CAPS capsule Take 0.4 mg by mouth daily.   03/16/2020 at Unknown time   ticagrelor (BRILINTA) 90 MG TABS tablet Take 1 tablet (90 mg total) by mouth 2 (two) times daily. 60 tablet 0 03/16/2020 at Unknown time   predniSONE (DELTASONE) 20 MG tablet prednisone 20 mg tablet (Patient not taking: Reported on 02/29/2020)   Not Taking at Unknown time  Tadalafil 2.5 MG TABS Take 1 tablet by mouth daily. (Patient not taking: Reported on 03/01/2020)   Not Taking at Unknown time    Assessment: Baseline labs ordered.  No anticoagulants PTA per med list.  Asked to initiate Heparin for ACS w/o bolus.  7/8 1412 HL 0.33  7/8 2129 HL 0.28, slightly subtherapeutic 7/9 0738 HL 0.41  Goal of Therapy:  Heparin level 0.3-0.7 units/ml Monitor platelets by anticoagulation protocol: Yes   Plan:  Heparin level is therapeutic. Will continue heparin rate at 1600 units/hr. Reheck anti-xa level in 6 hours. CBC daily while on heparin.   Ronnald Ramp, PharmD, BCPS 03/18/2020,9:12 AM

## 2020-03-18 NOTE — Progress Notes (Signed)
[facility] visited pt. as follow-up from [name]'s visit earlier today; pt. still awaiting wife's arrival and discharge.  Pt. asked if document could be completed at home; [facility] explained process of notarization can be done at a bank if need be.  No further needs at this time.    03/18/20 1500  Clinical Encounter Type  Visited With Patient  Visit Type Follow-up  Referral From Chaplain

## 2020-03-18 NOTE — Telephone Encounter (Signed)
Call placed per patient request to provide information that will help clarify previous cath findings for wife.   Call was answered but unable to speak with patient's significant other, as she could not hear me on the other end of the phone. Suspect that cell phone reception may have been an issue.   Patient will likely be discharged over the weekend. Cath was reviewed with patient today, and we can answer any additional questions via telephone if needed.  Signed, Lennon Alstrom, PA-C 03/18/2020, 5:06 PM

## 2020-03-18 NOTE — Progress Notes (Addendum)
ANTICOAGULATION CONSULT NOTE  Pharmacy Consult for Heparin Indication: chest pain/ACS  Allergies  Allergen Reactions  . Lisinopril Swelling and Cough    Patient Measurements: Height: 5\' 11"  (180.3 cm) Weight: 117.1 kg (258 lb 1.6 oz) IBW/kg (Calculated) : 75.3 HEPARIN DW (KG): 101  Vital Signs: Temp: 98 F (36.7 C) (07/09 1142) Temp Source: Oral (07/09 1142) BP: 121/84 (07/09 1142) Pulse Rate: 78 (07/09 1142)  Labs: Recent Labs    03/17/20 0304 03/17/20 0329 03/17/20 0605 03/17/20 1116 03/17/20 1118 03/17/20 1412 03/17/20 1412 03/17/20 1544 03/17/20 2129 03/18/20 0738 03/18/20 1352  HGB  --  12.5*  --   --   --   --   --   --   --  11.3*  --   HCT  --  36.8*  --   --   --   --   --   --   --  33.5*  --   PLT  --  394  --   --   --   --   --   --   --  320  --   APTT  --   --   --  46*  --   --   --   --   --   --   --   LABPROT 13.6  --   --   --   --   --   --   --   --   --   --   INR 1.1  --   --   --   --   --   --   --   --   --   --   HEPARINUNFRC  --   --   --   --   --  0.33   < >  --  0.28* 0.41 0.39  CREATININE  --  1.45*  --   --   --   --   --   --   --  1.00  --   TROPONINIHS  --  110*   < >  --    < > 1,927*  --  1,654* 1,606*  --   --    < > = values in this interval not displayed.    Estimated Creatinine Clearance: 112.4 mL/min (by C-G formula based on SCr of 1 mg/dL).   Medical History: Past Medical History:  Diagnosis Date  . 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 Gout   . Hypertension   . MI (myocardial infarction) (HCC)     Medications:  Medications Prior to Admission  Medication Sig Dispense Refill Last Dose  . aspirin 81 MG chewable tablet Chew 1 tablet (81 mg total) by mouth daily. 30 tablet 0 03/16/2020 at Unknown time  . atorvastatin (LIPITOR) 80 MG tablet Take 1 tablet (80 mg total) by mouth daily. 30 tablet 0 03/16/2020 at Unknown time  . metFORMIN (GLUCOPHAGE-XR) 500 MG 24 hr tablet Take 1,000 mg by mouth daily with  breakfast.   03/16/2020 at Unknown time  . metoprolol tartrate (LOPRESSOR) 25 MG tablet Take 0.5 tablets (12.5 mg total) by mouth 2 (two) times daily. 30 tablet 0 03/16/2020 at Unknown time  . potassium chloride SA (KLOR-CON) 20 MEQ tablet Take 1 tablet (20 mEq total) by mouth daily for 7 days. 7 tablet 0 03/16/2020 at Unknown time  . tamsulosin (FLOMAX) 0.4 MG CAPS capsule Take 0.4 mg by mouth daily.  03/16/2020 at Unknown time  . ticagrelor (BRILINTA) 90 MG TABS tablet Take 1 tablet (90 mg total) by mouth 2 (two) times daily. 60 tablet 0 03/16/2020 at Unknown time  . predniSONE (DELTASONE) 20 MG tablet prednisone 20 mg tablet (Patient not taking: Reported on 02/29/2020)   Not Taking at Unknown time  . Tadalafil 2.5 MG TABS Take 1 tablet by mouth daily. (Patient not taking: Reported on 03/01/2020)   Not Taking at Unknown time    Assessment: Baseline labs ordered.  No anticoagulants PTA per med list.  Asked to initiate Heparin for ACS w/o bolus.   7/8 1412 HL 0.33  7/8 2129 HL 0.28, slightly subtherapeutic 7/9 0738 HL 0.41 7/9 1352 HL 0.39   Goal of Therapy:  Heparin level 0.3-0.7 units/ml Monitor platelets by anticoagulation protocol: Yes   Plan:  Heparin level is therapeutic x 2. Will continue heparin rate at 1600 units/hr. Reheck anti-xa level and CBC with AM labs. Plan to d/c heparin in the AM per Cards.   Ronnald Ramp, PharmD, BCPS 03/18/2020,2:56 PM

## 2020-03-18 NOTE — Discharge Instructions (Signed)
Heart Attack A heart attack occurs when blood and oxygen supply to the heart is cut off. A heart attack causes damage to the heart that cannot be fixed. A heart attack is also called a myocardial infarction, or MI. If you think you are having a heart attack, do not wait to see if the symptoms will go away. Get medical help right away. What are the causes? This condition may be caused by:  A fatty substance (plaque) in the blood vessels (arteries). This can block the flow of blood to the heart.  A blood clot in the blood vessels that go to the heart. The blood clot blocks blood flow.  Low blood pressure.  An abnormal heartbeat.  Some diseases, such as problems in red blood cells (anemia)orproblems in breathing (respiratory failure).  Tightening (spasm) of a blood vessel that cuts off blood to the heart.  A tear in a blood vessel of the heart.  High blood pressure. What increases the risk? The following factors may make you more likely to develop this condition:  Aging. The older you are, the higher your risk.  Having a personal or family history of chest pain, heart attack, stroke, or narrowing of the arteries in the legs, arms, head, or stomach (peripheral artery disease).  Being male.  Smoking.  Not getting regular exercise.  Being overweight or obese.  Having high blood pressure.  Having high cholesterol.  Having diabetes.  Drinking too much alcohol.  Using illegal drugs, such as cocaine or methamphetamine. What are the signs or symptoms? Symptoms of this condition include:  Chest pain. It may feel like: ? Crushing or squeezing. ? Tightness, pressure, fullness, or heaviness.  Pain in the arm, neck, jaw, back, or upper body.  Shortness of breath.  Heartburn.  Upset stomach (indigestion).  Feeling like you may vomit (nauseous).  Cold sweats.  Feeling tired.  Sudden light-headedness. How is this treated? A heart attack must be treated as soon as  possible. Treatment may include:  Medicines to: ? Break up or dissolve blood clots. ? Thin blood and help prevent blood clots. ? Treat blood pressure. ? Improve blood flow to the heart. ? Reduce pain. ? Reduce cholesterol.  Procedures to widen a blocked artery and keep it open.  Open heart surgery.  Receiving oxygen.  Making your heart strong again (cardiac rehabilitation) through exercise, education, and counseling. Follow these instructions at home: Medicines  Take over-the-counter and prescription medicines only as told by your doctor. You may need to take medicine: ? To keep your blood from clotting too easily. ? To control blood pressure. ? To lower cholesterol. ? To control heart rhythms.  Do not take these medicines unless your doctor says it is okay: ? NSAIDs, such as ibuprofen. ? Supplements that have vitamin A, vitamin E, or both. ? Hormone replacement therapy that has estrogen with or without progestin. Lifestyle      Do not use any products that have nicotine or tobacco, such as cigarettes, e-cigarettes, and chewing tobacco. If you need help quitting, ask your doctor.  Avoid secondhand smoke.  Exercise regularly. Ask your doctor about a cardiac rehab program.  Eat heart-healthy foods. Your doctor will tell you what foods to eat.  Stay at a healthy weight.  Lower your stress level.  Do not use illegal drugs. Alcohol use  Do not drink alcohol if: ? Your doctor tells you not to drink. ? You are pregnant, may be pregnant, or are planning to become pregnant.    If you drink alcohol: ? Limit how much you use to:  0-1 drink a day for women.  0-2 drinks a day for men. ? Know how much alcohol is in your drink. In the [place] one drink equals one 12 oz bottle of beer (355 mL), one 5 oz glass of wine (148 mL), or one 1 oz glass of hard liquor (44 mL). General instructions  Work with your doctor to treat other problems you may have, such as diabetes or high  blood pressure.  Get screened for depression. Get treatment if needed.  Keep your vaccines up to date. Get the flu shot (influenza vaccine) every year.  Keep all follow-up visits as told by your doctor. This is important. Contact a doctor if:  You feel very sad.  You have trouble doing your daily activities. Get help right away if:  You have sudden, unexplained discomfort in your chest, arms, back, neck, jaw, or upper body.  You have shortness of breath.  You have sudden sweating or clammy skin.  You feel like you may vomit.  You vomit.  You feel tired or weak.  You get light-headed or dizzy.  You feel your heart beating fast.  You feel your heart skipping beats.  You have blood pressure that is higher than 180/120. These symptoms may be an emergency. Do not wait to see if the symptoms will go away. Get medical help right away. Call your local emergency services (911 in the [place] Do not drive yourself to the hospital. Summary  A heart attack occurs when blood and oxygen supply to the heart is cut off.  Do not take NSAIDs unless your doctor says it is okay.  Do not smoke. Avoid secondhand smoke.  Exercise regularly. Ask your doctor about a cardiac rehab program. This information is not intended to replace advice given to you by your health care provider. Make sure you discuss any questions you have with your health care provider. Document Revised: 12/08/2018 Document Reviewed: 12/08/2018 Elsevier Patient Education  2020 [employer]  

## 2020-03-18 NOTE — Progress Notes (Signed)
Patient is alert and oriented belongings with patient discharge instructions discussed with patient

## 2020-03-18 NOTE — Progress Notes (Signed)
Progress Note  Patient Name: Joshua Holder Date of Encounter: 03/18/2020  Primary Cardiologist: Lorine Bears, MD   Subjective   Reports his chest tightness is almost gone.   Still has orthopnea when laying down, as well as a cough. Reports this occurs even with a CPAP. Also reports very mild bilateral ankle swelling. No SOB.   Discussed his most recent cath and echos (past and most recent). He requests a call be placed to his wife to explain to her as well, which we can do later today.  Discussed need to reschedule him in the office.   Reports L wrist gout flare up. Has not had his allopurinol or colchicine but agreeable to seeing his PCP for follow-up and refills.   Inpatient Medications    Scheduled Meds: . atorvastatin  80 mg Oral Daily  . insulin aspart  0-15 Units Subcutaneous TID WC  . insulin aspart  0-5 Units Subcutaneous QHS  . metoprolol tartrate  25 mg Oral BID  . sodium chloride flush  10 mL Intravenous Q12H  . ticagrelor  90 mg Oral BID   Continuous Infusions: . heparin 1,600 Units/hr (03/18/20 0300)   PRN Meds: acetaminophen, nitroGLYCERIN, ondansetron (ZOFRAN) IV   Vital Signs    Vitals:   03/17/20 2001 03/17/20 2304 03/18/20 0601 03/18/20 0746  BP: 108/85  116/89 115/77  Pulse: 79  85 87  Resp: 16  16 18   Temp: 97.7 F (36.5 C)  98 F (36.7 C) 97.9 F (36.6 C)  TempSrc: Oral   Oral  SpO2: 100%  100% 100%  Weight:  117.1 kg    Height:  5\' 11"  (1.803 m)      Intake/Output Summary (Last 24 hours) at 03/18/2020 0812 Last data filed at 03/18/2020 0300 Gross per 24 hour  Intake 398.05 ml  Output 525 ml  Net -126.95 ml   Last 3 Weights 03/17/2020 03/17/2020 03/01/2020  Weight (lbs) 258 lb 1.6 oz 258 lb 265 lb 14 oz  Weight (kg) 117.073 kg 117.028 kg 120.6 kg      Telemetry    NSR and ST, 80s, max 123bpm, occasional PVCs - Personally Reviewed  ECG    No new tracings- Personally Reviewed  Physical Exam   GEN: No acute distress.   Neck:  JVD difficult to assess 2/2 body habitus Cardiac: RRR with 2/6 systolic murmur, no  rubs, or gallops.  Respiratory: Clear to auscultation bilaterally. GI: Soft, nontender, non-distended  MS: Mild non-pitting pre-tibial edema R>L; No deformity. Neuro:  Nonfocal  Psych: Normal affect   Labs    High Sensitivity Troponin:   Recent Labs  Lab 03/17/20 0605 03/17/20 1118 03/17/20 1412 03/17/20 1544 03/17/20 2129  TROPONINIHS 575* 1,452* 1,927* 1,654* 1,606*      Chemistry Recent Labs  Lab 03/17/20 0329  NA 133*  K 4.2  CL 99  CO2 22  GLUCOSE 316*  BUN 23*  CREATININE 1.45*  CALCIUM 9.2  PROT 7.6  ALBUMIN 3.7  AST 16  ALT 17  ALKPHOS 78  BILITOT 1.4*  GFRNONAA 55*  GFRAA >60  ANIONGAP 12     Hematology Recent Labs  Lab 03/17/20 0329  WBC 7.6  RBC 4.43  HGB 12.5*  HCT 36.8*  MCV 83.1  MCH 28.2  MCHC 34.0  RDW 12.5  PLT 394    BNPNo results for input(s): BNP, PROBNP in the last 168 hours.   DDimer No results for input(s): DDIMER in the last 168 hours.   Radiology  DG Chest Portable 1 View  Result Date: 03/17/2020 CLINICAL DATA:  Shortness of breath, sudden onset EXAM: PORTABLE CHEST 1 VIEW COMPARISON:  02/29/2020 FINDINGS: Artifact from EKG leads and defibrillator pads. Mild prominence of lung markings without Kerley lines, likely accentuated by lower volumes. No consolidation or pneumothorax. No pleural effusion. Normal heart size IMPRESSION: Negative portable chest. Electronically Signed   By: Marnee Spring M.D.   On: 03/17/2020 04:18   ECHOCARDIOGRAM COMPLETE  Result Date: 03/17/2020    ECHOCARDIOGRAM REPORT   Patient Name:   MASUD HOLUB Date of Exam: 03/17/2020 Medical Rec #:  [id number]       Height:       71.0 in Accession #:    [id number]      Weight:       258.0 lb Date of Birth:  May 18, 1966      BSA:          2.350 m Patient Age:    53 years        BP:           129/85 mmHg Patient Gender: M               HR:           67 bpm. Exam Location:   [facility] Procedure: 2D Echo, Cardiac Doppler and Color Doppler Indications:     CHF 428.0  History:         Patient has prior history of Echocardiogram examinations, most                  recent 03/01/2020. Risk Factors:Hypertension and Diabetes. MI.  Sonographer:     Cristela Blue RDCS (AE) Referring Phys:  [id number] Lennon Alstrom Diagnosing Phys: Julien Nordmann MD  Sonographer Comments: Technically challenging study due to limited acoustic windows. IMPRESSIONS  1. Left ventricular ejection fraction, by estimation, is 50%. The left ventricle has low normal function. Inferior wall hypokinesis. There is moderate left ventricular hypertrophy. Left ventricular diastolic parameters are consistent with Grade II diastolic dysfunction (pseudonormalization).  2. Right ventricular systolic function is normal. The right ventricular size is normal. There is mildly elevated pulmonary artery systolic pressure. The estimated right ventricular systolic pressure is 38.1 mmHg.  3. Mild to moderate mitral valve regurgitation. FINDINGS  Left Ventricle: Left ventricular ejection fraction, by estimation, is 50 to 55%. The left ventricle has low normal function. Inferior wall hypokinesis. The left ventricular internal cavity size was normal in size. There is moderate left ventricular hypertrophy. Left ventricular diastolic parameters are consistent with Grade II diastolic dysfunction (pseudonormalization). Right Ventricle: The right ventricular size is normal. No increase in right ventricular wall thickness. Right ventricular systolic function is normal. There is mildly elevated pulmonary artery systolic pressure. The tricuspid regurgitant velocity is 2.65  m/s, and with an assumed right atrial pressure of 10 mmHg, the estimated right ventricular systolic pressure is 38.1 mmHg. Left Atrium: Left atrial size was normal in size. Right Atrium: Right atrial size was normal in size. Pericardium: There is no evidence of pericardial effusion. Mitral  Valve: The mitral valve is normal in structure. Normal mobility of the mitral valve leaflets. Mild to moderate mitral valve regurgitation. No evidence of mitral valve stenosis. Tricuspid Valve: The tricuspid valve is normal in structure. Tricuspid valve regurgitation is mild . No evidence of tricuspid stenosis. Aortic Valve: The aortic valve was not well visualized. Aortic valve regurgitation is mild. No aortic stenosis is present. Aortic valve mean gradient measures 2.5 mmHg.  Aortic valve peak gradient measures 4.5 mmHg. Aortic valve area, by VTI measures 2.93  cm. Pulmonic Valve: The pulmonic valve was normal in structure. Pulmonic valve regurgitation is not visualized. No evidence of pulmonic stenosis. Aorta: The aortic root is normal in size and structure. Venous: The inferior vena cava is normal in size with greater than 50% respiratory variability, suggesting right atrial pressure of 3 mmHg. IAS/Shunts: No atrial level shunt detected by color flow Doppler.  LEFT VENTRICLE PLAX 2D LVIDd:         4.41 cm  Diastology LVIDs:         3.29 cm  LV e' lateral:   11.10 cm/s LV PW:         1.35 cm  LV E/e' lateral: 8.3 LV IVS:        1.10 cm  LV e' medial:    5.11 cm/s LVOT diam:     2.10 cm  LV E/e' medial:  18.1 LV SV:         57 LV SV Index:   24 LVOT Area:     3.46 cm  RIGHT VENTRICLE RV Basal diam:  4.37 cm RV S prime:     7.51 cm/s TAPSE (M-mode): 3.9 cm LEFT ATRIUM             Index       RIGHT ATRIUM           Index LA diam:        3.80 cm 1.62 cm/m  RA Area:     22.70 cm LA Vol (A2C):   60.6 ml 25.79 ml/m RA Volume:   69.50 ml  29.58 ml/m LA Vol (A4C):   42.6 ml 18.13 ml/m LA Biplane Vol: 52.7 ml 22.43 ml/m  AORTIC VALVE                   PULMONIC VALVE AV Area (Vmax):    2.57 cm    PV Vmax:        0.83 m/s AV Area (Vmean):   2.81 cm    PV Peak grad:   2.7 mmHg AV Area (VTI):     2.93 cm    RVOT Peak grad: 3 mmHg AV Vmax:           106.50 cm/s AV Vmean:          70.300 cm/s AV VTI:            0.195 m  AV Peak Grad:      4.5 mmHg AV Mean Grad:      2.5 mmHg LVOT Vmax:         79.00 cm/s LVOT Vmean:        57.000 cm/s LVOT VTI:          0.165 m LVOT/AV VTI ratio: 0.85  AORTA Ao Root diam: 3.70 cm MITRAL VALVE               TRICUSPID VALVE MV Area (PHT): 3.99 cm    TR Peak grad:   28.1 mmHg MV Decel Time: 190 msec    TR Vmax:        265.00 cm/s MV E velocity: 92.50 cm/s MV A velocity: 55.70 cm/s  SHUNTS MV E/A ratio:  1.66        Systemic VTI:  0.16 m  Systemic Diam: 2.10 cm Julien Nordmann MD Electronically signed by Julien Nordmann MD Signature Date/Time: 03/17/2020/6:18:41 PM    Final     Cardiac Studies   Echo 03/17/20 1. Left ventricular ejection fraction, by estimation, is 50%. The left  ventricle has low normal function. Inferior wall hypokinesis. There is  moderate left ventricular hypertrophy. Left ventricular diastolic  parameters are consistent with Grade II  diastolic dysfunction (pseudonormalization).  2. Right ventricular systolic function is normal. The right ventricular  size is normal. There is mildly elevated pulmonary artery systolic  pressure. The estimated right ventricular systolic pressure is 38.1 mmHg.  3. Mild to moderate mitral valve regurgitation.   LHC 03/01/2020:  LV end diastolic pressure is moderately elevated.  The left ventricular ejection fraction is 35-45% by visual estimate.  There is moderate left ventricular systolic dysfunction.  Prox RCA to Mid RCA lesion is 100% stenosed.  Post intervention, there is a 100% residual stenosis.  Balloon angioplasty was performed using a BALLOON TREK RX 2.5X15.  Mid LAD lesion is 40% stenosed.  1. Severe one-vessel coronary artery disease with thrombotic occlusion of the proximal right coronary artery with reasonable left to right collaterals from the LAD. 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 2. Moderately reduced LV systolic function  with an EF of 35 to 40% with inferior wall akinesis. 3. Moderately elevated left ventricular end-diastolic pressure at 27 mmHg. 4. 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 I felt that continued intervention would carry a significant risk of no reflow due to distal embolization and that would risk the already developed collaterals.  Recommendations: This was a case of inferior ST elevation myocardial infarction with late presentation after more than 24 hours of symptoms. Initial plan was to treat him medically and consider angiography in the morning but then his chest pain worsened with diaphoresis and I elected to proceed with emergent cardiac catheterization with the above results. The patient was started on Aggrastat to be continued for 18 hours. I started the patient on oral dual antiplatelet therapy with aspirin and ticagrelor. If the patient remains clinically stable, no plans for repeat angiography and the plan is to treat him medically assuming that his left-to-right collaterals will continue to develop. If in the other hand he has recurrent symptoms or instability, relook angiography can be considered after the patient has been treated with intravenous and oral antiplatelet medications. The patient is at high risk for mechanical complications and right ventricular failure. __________  2D echo 03/01/2020: 1. Left ventricular ejection fraction, by estimation, is 50 to 55%. The  left ventricle has low normal function. The left ventricle demonstrates  regional wall motion abnormalities (see scoring diagram/findings for  description). There is mild left  ventricular hypertrophy. Left ventricular diastolic parameters were  normal. There is severe hypokinesis of the left ventricular, basal-mid  inferior wall and inferoseptal wall.  2. Right ventricular systolic function is  normal. The right ventricular  size is not well visualized. There is normal pulmonary artery systolic  pressure.  3. The mitral valve is normal in structure. Trivial mitral valve  regurgitation. No evidence of mitral stenosis.  4. The aortic valve is tricuspid. Aortic valve regurgitation is not  visualized. No aortic stenosis is present.  5. The inferior vena cava is dilated in size with <50% respiratory  variability, suggesting right atrial pressure of  15 mmHg.  Patient Profile     54 y.o. male with a hx of CAD (pRCA s/p PTCA without PCI / stenting in setting thrombus and aneurysmal dz) and late presenting inferior STEMI with post MI course complicated by brief A. fib with RVR following catheterization, diabetes mellitus, essential hypertension, gout, and who is being seen today for the evaluation of NSTEMI.  Assessment & Plan    NSTEMI H/o CAD with recent 6/21 late presenting inferior MI --Chest tightness significantly improved. Admission for CP and racing HR / SVT after recent LHC for late presenting inferior STEMI. CP different than that before his cath and a tightness associated with rapid rate. Note that he was being treated for pericarditis as well, which also may be contributing to CP. --Updated echo with EF 50-55% as above. Previous echo as above with low normal EF 50-55%; however, high risk for RV failure. Previous cath as above and unable to perform PCI / stent. --HS Tn peaked 1,927.0 and now down-trending. Considered is elevation 2/2 AKI and rapid ventricular rate. Given previous cath as above and elevation of Tn, however, will need to monitor closely for sx going forward and in the office. --Continue IV heparin for medical management. Before discharge, transition back to ASA for DAPT. Increased dose of BB. Continue to up-titrate as BP allows. Continue Brilinta and statin.  --Will need office f/u within 1-2 weeks.  SVT --Consider as 2/2 recent prednisone and corticosteroid  use. Also considered is abnormal electrolytes and recent possible pericarditis s/p late presenting inferior STEMI. TSH wnl. Echo updated as below. --S/p adenosine x3 and discontinued IV diltiazem.  --Remains NSR, rate relatively well controlled.  --Increased PTA BB to metoprolol 25mg  BID for additional rate support. Continue and escalate as tolerated by BP for rate control..  --Given h/o Afib after MI, and that this is his second episode of AT, continue IV heparin for now. Will likely need Zio at discharge +/- reconsideration of oral anticoagulation at discharge.   HFrEF (EF 50-55%) --SOB improved at this time. Cough / orthopnea / PND reportedly still present. --6/22 EF 50-55%. LVEDP moderately elevated. --Repeat echo with EF 50% and G2DD. RVSF nl. RVSP 38.41mmHg.  --Given high risk of RV failure, we have been deferring diuresis. --Monitor I/Os, daily weights. Daily BMET. --ACEi allergy, angioedema. ARB held 2/2 AKI / soft BP at this time.  --Continue BB and increase dose as BP allows. --CHF education recommended.  Mild to moderate MR --Consider as contributing to sx. Will monitor as outpatient   HTN --Controlled.  --ARB held as above 2/2 AKI and can be restarted as BP and renal function allow. If not restarted before discharge, reassess restart at RTC.  Noted ACE allergy.  Pericarditis, previous admission  --As per most recent admission, suspected. Discharged on colchicine, held on admission yesterday. Recommend restart with recovery of renal function if possible.  HLD --Continue statin.  AKI --Improved. Cr 1.45  Cr 1.00. BUN 22. Daily BMET.  Hypomagnesemia --Repleting. Recheck Mg today to ensure at goal of 2.0.   DM2 --SSI, Per IM.  Glycemic control recommended for risk factor modification.  Anemia --Appears chronic on review of previous labs; however, given steady drop with associated drop in HCT, would recommend further workup before discharge on DAPT to ensure no  acute bleed per IM.    For questions or updates, please contact [facility] Please consult [email or web address] for contact info under        Signed, 0m, PA-C  03/18/2020, 8:12 AM

## 2020-03-23 ENCOUNTER — Ambulatory Visit (INDEPENDENT_AMBULATORY_CARE_PROVIDER_SITE_OTHER): Payer: Commercial Managed Care - PPO | Admitting: Family

## 2020-03-23 ENCOUNTER — Encounter: Payer: Self-pay | Admitting: Family

## 2020-03-23 ENCOUNTER — Ambulatory Visit (INDEPENDENT_AMBULATORY_CARE_PROVIDER_SITE_OTHER): Payer: Commercial Managed Care - PPO

## 2020-03-23 ENCOUNTER — Other Ambulatory Visit: Payer: Self-pay

## 2020-03-23 VITALS — BP 114/88 | HR 80 | Ht 71.0 in | Wt 256.0 lb

## 2020-03-23 DIAGNOSIS — I255 Ischemic cardiomyopathy: Secondary | ICD-10-CM

## 2020-03-23 DIAGNOSIS — I5020 Unspecified systolic (congestive) heart failure: Secondary | ICD-10-CM | POA: Diagnosis not present

## 2020-03-23 DIAGNOSIS — I4892 Unspecified atrial flutter: Secondary | ICD-10-CM

## 2020-03-23 DIAGNOSIS — E118 Type 2 diabetes mellitus with unspecified complications: Secondary | ICD-10-CM

## 2020-03-23 DIAGNOSIS — Z794 Long term (current) use of insulin: Secondary | ICD-10-CM

## 2020-03-23 DIAGNOSIS — I5042 Chronic combined systolic (congestive) and diastolic (congestive) heart failure: Secondary | ICD-10-CM

## 2020-03-23 DIAGNOSIS — I4891 Unspecified atrial fibrillation: Secondary | ICD-10-CM

## 2020-03-23 DIAGNOSIS — I25118 Atherosclerotic heart disease of native coronary artery with other forms of angina pectoris: Secondary | ICD-10-CM | POA: Diagnosis not present

## 2020-03-23 DIAGNOSIS — E785 Hyperlipidemia, unspecified: Secondary | ICD-10-CM | POA: Diagnosis not present

## 2020-03-23 DIAGNOSIS — I10 Essential (primary) hypertension: Secondary | ICD-10-CM | POA: Insufficient documentation

## 2020-03-23 DIAGNOSIS — I340 Nonrheumatic mitral (valve) insufficiency: Secondary | ICD-10-CM

## 2020-03-23 DIAGNOSIS — I219 Acute myocardial infarction, unspecified: Secondary | ICD-10-CM | POA: Insufficient documentation

## 2020-03-23 DIAGNOSIS — N529 Male erectile dysfunction, unspecified: Secondary | ICD-10-CM

## 2020-03-23 DIAGNOSIS — E139 Other specified diabetes mellitus without complications: Secondary | ICD-10-CM | POA: Insufficient documentation

## 2020-03-23 MED ORDER — TICAGRELOR 90 MG PO TABS: 90.0000 mg | ORAL_TABLET | Freq: Two times a day (BID) | ORAL | 1 refills | Status: AC

## 2020-03-23 MED ORDER — ATORVASTATIN CALCIUM 80 MG PO TABS: 80.0000 mg | ORAL_TABLET | Freq: Every day | ORAL | 1 refills | Status: DC

## 2020-03-23 MED ORDER — METOPROLOL TARTRATE 25 MG PO TABS: 25.0000 mg | ORAL_TABLET | Freq: Two times a day (BID) | ORAL | 1 refills | Status: DC

## 2020-03-23 NOTE — Progress Notes (Addendum)
Office Visit    Patient Name: Joshua Joshua Date of Encounter: 03/23/2020  Primary Care Provider:  System, Provider Not In Primary Cardiologist:  Lorine Bears, MD Electrophysiologist:  None   Chief Complaint    Joshua Joshua is a 54 y.o. male with a hx of CAD (late presenting inferior STEMI 02/29/20 with thrombotic occlusion RCA with L-R collaterals treated medically), HFrEF secondary to ICM, etoh use, DM2, HTN, gout presents today for hospital follow up after STEMI.  Past Medical History    Past Medical History:  Diagnosis Date  . 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 Gout   . Hypertension   . MI (myocardial infarction) [facility])    Past Surgical History:  Procedure Laterality Date  . CORONARY/GRAFT ACUTE MI REVASCULARIZATION N/A 03/01/2020   Procedure: Coronary/Graft Acute MI Revascularization;  Surgeon: Iran Ouch, MD;  Location: [facility] INVASIVE CV LAB;  Service: Cardiovascular;  Laterality: N/A;  . INCISION AND DRAINAGE Left 12/30/2018   Procedure: INCISION AND DRAINAGE LEFT ELBOW;  Surgeon: Juanell Fairly, MD;  Location: [facility] ORS;  Service: Orthopedics;  Laterality: Left;  . LEFT HEART CATH AND CORONARY ANGIOGRAPHY N/A 03/01/2020   Procedure: LEFT HEART CATH AND CORONARY ANGIOGRAPHY;  Surgeon: Iran Ouch, MD;  Location: [facility] INVASIVE CV LAB;  Service: Cardiovascular;  Laterality: N/A;    Allergies  Allergies  Allergen Reactions  . Lisinopril Swelling and Cough    History of Present Illness    Joshua Joshua is a 54 y.o. male with a hx of CAD (late presenting inferior STEMI 02/29/20 with thrombotic occlusion RCA with L-R collaterals treated medically), HFrEF secondary to ICM, etoh use, DM2, HTN, gout last seen while hospitalized.   Presented to [facility] 02/29/2020 with chest pain starting the day prior associated with fatigue, shortness of breath.  EKG showed minimal inferior ST T elevation and noted inferior Q waves.  Troponin came back greater than  20,000.  Presentation consistent with late presenting inferior STEMI.  Initial plan for medical management however chest pain worsened and he was recommended for emergent cardiac cath.  Cardiac catheterization 03/01/2020 with severe one-vessel CAD with thrombotic occlusion of the proximal RCA with reasonable left-to-right collaterals from the LAD.  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  Moderately reduced LV systolic function with EF 35-40% with inferior wall akinesis.  Moderately elevated LVEDP 27 mmHg.  Balloon angioplasty of RCA only established TIMI I flow and revealed RCA with aneurysmal with massive amount of thrombus.  Continued and for intervention was not pursued due to risk of no reflow due to distal embolization risking the already developed collaterals. Echo 03/01/20 LVEF 50 to 55%, wall motion abnormalities, mild LVH, RV normal size and function, normal PASP, trivial MR. His post MI course was complicated by brief episode of atrial fibrillation with RVR.  Converted with IV amiodarone.  He was discharged on colchicine for possible pericarditis in the setting of late presenting MI.  Readmitted 03/17/2020 in setting of atrial flutter versus SVT with demand ischemia in the setting of known coronary disease.  He was treated with IV diltiazem.  Metoprolol tartrate was increased to 25 mg twice daily.  ARB was held and then deferred due to low normal blood pressure as well as acute kidney injury.  Repeat echo 03/17/2020 with EF 50%, grade 2 diastolic dysfunction, RV normal size and function with mildly elevated PASP, mild to moderate MR.  Reports feeling fatigue since hospital discharge.  No  recurrent chest pain, pressure, tightness.  No shortness of breath at rest.  Does get some dyspnea with more than usual activity.  He has been walking his driveway which is approximately 50 feet and relatively flat a few times per day for exercise.  He has about returning to work.  Endorses that he has  a high stress job but it is mostly sedentary. Walking his 8foot driveway which is flat a few times per day.  He does endorse being a "heavy drinker" for the last 30 years but has not had anything to drink since hospital discharge and plans to continue to not drink.  Tells me he and his wife have made changes to their diet and are eating a more heart healthy diet since his discharge.  He does not that his primary care provider started him on Cialis at a routine clinic visit prior to his STEMI.  We discussed the contraindication between Cialis and nitroglycerin.  He does not plan to resume Cialis at this time.   We reviewed his cardiac testing while hospitalized as well as his medications in depth.  EKGs/Labs/Other Studies Reviewed:   The following studies were reviewed today: Echo02-Aug-2021  1. Left ventricular ejection fraction, by estimation, is 50%. The left  ventricle has low normal function. Inferior wall hypokinesis. There is  moderate left ventricular hypertrophy. Left ventricular diastolic  parameters are consistent with Grade II  diastolic dysfunction (pseudonormalization).   2. Right ventricular systolic function is normal. The right ventricular  size is normal. There is mildly elevated pulmonary artery systolic  pressure. The estimated right ventricular systolic pressure is 38.1 mmHg.   3. Mild to moderate mitral valve regurgitation.   2D echo 03/01/2020: 1. Left ventricular ejection fraction, by estimation, is 50 to 55%. The  left ventricle has low normal function. The left ventricle demonstrates  regional wall motion abnormalities (see scoring diagram/findings for  description). There is mild left  ventricular hypertrophy. Left ventricular diastolic parameters were  normal. There is severe hypokinesis of the left ventricular, basal-mid  inferior wall and inferoseptal wall.   2. Right ventricular systolic function is normal. The right ventricular  size is not well visualized.  There is normal pulmonary artery systolic  pressure.   3. The mitral valve is normal in structure. Trivial mitral valve  regurgitation. No evidence of mitral stenosis.   4. The aortic valve is tricuspid. Aortic valve regurgitation is not  visualized. No aortic stenosis is present.   5. The inferior vena cava is dilated in size with <50% respiratory  variability, suggesting right atrial pressure of 15 mmHg  Cardiac cath 03/01/2020  LV end diastolic pressure is moderately elevated.  The left ventricular ejection fraction is 35-45% by visual estimate.  There is moderate left ventricular systolic dysfunction.  Prox RCA to Mid RCA lesion is 100% stenosed.  Post intervention, there is a 100% residual stenosis.  Balloon angioplasty was performed using a BALLOON TREK RX [id number].  Mid LAD lesion is 40% stenosed.   1.  Severe one-vessel coronary artery disease with thrombotic occlusion of the proximal right coronary artery with reasonable left to right collaterals from the LAD.  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 2.  Moderately reduced LV systolic function with an EF of 35 to 40% with inferior wall akinesis. 3.  Moderately elevated left ventricular end-diastolic pressure at 27 mmHg. 4.  Balloon angioplasty of the right coronary artery only established TIMI I flow and this  revealed that the RCA was aneurysmal with massive amount of thrombus.  I felt that continued intervention would carry a significant risk of no reflow due to distal embolization and that would risk the already developed collaterals.   Recommendations: This was a case of inferior ST elevation myocardial infarction with late presentation after more than 24 hours of symptoms.  Initial plan was to treat him medically and consider angiography in the morning but then his chest pain worsened with diaphoresis and I elected to proceed with emergent cardiac catheterization with the above  results. The patient was started on Aggrastat to be continued for 18 hours. I started the patient on oral dual antiplatelet therapy with aspirin and ticagrelor. If the patient remains clinically stable, no plans for repeat angiography and the plan is to treat him medically assuming that his left-to-right collaterals will continue to develop. If in the other hand he has recurrent symptoms or instability, relook angiography can be considered after the patient has been treated with intravenous and oral antiplatelet medications. The patient is at high risk for mechanical complications and right ventricular failure.   EKG:  EKG is ordered today.  The ekg ordered today demonstrates SR 80 bpm with occasional PVC, LVH, stable TWI inferior lead, prolonged QT (QT 414, QTc 477)  Recent Labs: 03/17/2020: ALT 17; TSH 0.786 03/18/2020: BUN 22; Creatinine, Ser 1.00; Hemoglobin 11.3; Magnesium 1.6; Platelets 320; Potassium 3.9; Sodium 136  Recent Lipid Panel    Component Value Date/Time   CHOL 208 (H) 03/01/2020 0409   TRIG 128 03/01/2020 0409   HDL 49 03/01/2020 0409   CHOLHDL 4.2 03/01/2020 0409   VLDL 26 03/01/2020 0409   LDLCALC 133 (H) 03/01/2020 0409    Home Medications   Current Meds  Medication Sig  . aspirin 81 MG chewable tablet Chew 1 tablet (81 mg total) by mouth daily.  Marland Kitchen atorvastatin (LIPITOR) 80 MG tablet Take 1 tablet (80 mg total) by mouth daily.  . colchicine 0.6 MG tablet Take 1 tablet (0.6 mg total) by mouth daily for 10 days.  . metFORMIN (GLUCOPHAGE-XR) 500 MG 24 hr tablet Take 1,000 mg by mouth daily with breakfast.  . metoprolol tartrate (LOPRESSOR) 25 MG tablet Take 1 tablet (25 mg total) by mouth 2 (two) times daily.  . nitroGLYCERIN (NITROSTAT) 0.4 MG SL tablet Place 1 tablet (0.4 mg total) under the tongue every 5 (five) minutes as needed for chest pain.  . tadalafil (CIALIS) 5 MG tablet Take 5 mg by mouth daily as needed for erectile dysfunction.  . tamsulosin (FLOMAX) 0.4  MG CAPS capsule Take 0.4 mg by mouth daily.  . ticagrelor (BRILINTA) 90 MG TABS tablet Take 1 tablet (90 mg total) by mouth 2 (two) times daily.  . [DISCONTINUED] atorvastatin (LIPITOR) 80 MG tablet Take 1 tablet (80 mg total) by mouth daily.  . [DISCONTINUED] metoprolol tartrate (LOPRESSOR) 25 MG tablet Take 1 tablet (25 mg total) by mouth 2 (two) times daily.  . [DISCONTINUED] ticagrelor (BRILINTA) 90 MG TABS tablet Take 1 tablet (90 mg total) by mouth 2 (two) times daily.      Review of Systems     Review of Systems  Constitutional: Positive for malaise/fatigue. Negative for chills and fever.  Cardiovascular: Positive for dyspnea on exertion. Negative for chest pain, irregular heartbeat, leg swelling, near-syncope, orthopnea, palpitations and syncope.  Respiratory: Negative for cough, shortness of breath and wheezing.   Gastrointestinal: Negative for melena, nausea and vomiting.  Genitourinary: Negative for hematuria.  Neurological: Negative for dizziness, light-headedness and weakness.   All other systems reviewed and are otherwise negative except as noted above.  Physical Exam    VS:  BP 114/88 (BP Location: Right Arm, Patient Position: Sitting, Cuff Size: Large)   Pulse 80   Ht 5\' 11"  (1.803 m)   Wt 256 lb (116.1 kg)   SpO2 99%   BMI 35.70 kg/m  , BMI Body mass index is 35.7 kg/m. GEN: Well nourished, well developed, in no acute distress. HEENT: normal. Neck: Supple, no JVD, carotid bruits, or masses. Cardiac: RRR, no murmurs, rubs, or gallops. No clubbing, cyanosis, edema.  Radials/DP/PT 2+ and equal bilaterally.  Respiratory:  Respirations regular and unlabored, clear to auscultation bilaterally. GI: Soft, nontender, nondistended, BS + x 4. MS: No deformity or atrophy. Skin: Warm and dry, no rash.  Right radial cath site clean, dry, intact with no ecchymosis, erythema, hematoma Neuro:  Strength and sensation are intact. Psych: Normal affect.   Assessment & Plan      1. CAD s/p inferior STEMI -late presenting inferior STEMI 02/29/2020.  Cardiac cath with severe one-vessel CAD with thrombotic occlusion of proximal RCA with reasonable left-to-right collaterals from LAD.  Recommended for medical management.  GDMT includes DAPT of aspirin/Brilinta, metoprolol, high intensity statin, PRN nitroglycerin. Medical therapy for coronary disease reviewed in depth during visit. R radial cath site healing appropriately. He has been preferred to cardiac rehab and was encouraged to participate. No bleeding complications on DAPT. Refills provided today.   2. Pericarditis - Treated with 10 days PO colchicine for pericarditis in setting of late presenting MI.  No recurrent chest pain, pressure, tightness.   3. Etoh use - Disclosed that he has been a "heavy drinker" for the last 30 years but no etoh since hospital discharge. Continued cessation encouraged.   4. HFrEF secondary to ICM - Echo during cath 03/01/2020 LVEF 35-45% at time of STEMI.  Echo 03/01/2020 EF 50-55%.  Echo 03/17/2020 EF EF 50%, grade 2 diastolic dysfunction.  Euvolemic and well compensated on exam today.  He has been referred to cardiac rehab and was encouraged to participate.  Continue metoprolol 25 mg twice daily.  Further GDMT limited by relative hypotension.  5. Atrial fib with RVR/atrial flutter- Brief episode atrial fibrillation post MI treated with IV amiodarone. CHADS2VASC of 4. As episode <48 hours, anticoagulation deferred.  During subsequent admission he had atrial flutter treated with adenosinex3 and IV diltiazem.  EKG today shows sinus rhythm with occasional PVC.  Continue metoprolol 25 mg twice daily.  14-day ZIO monitor placed today for assessment of recurrent arrhythmia.  If atrial fibrillation noted or require anticoagulation.  6. HTN -BP well controlled today on metoprolol 25 mg twice daily.  Previously intolerant to ACE.  No ARB due to low normal blood pressure.  Encouraged to monitor at home.  Continue  low-sodium, heart healthy diet.  7. HLD, LDL goal <70 -02/28/2020 LDL 133.  Started on atorvastatin 80 mg daily during hospital, tolerating well.  Plan for repeat lipid panel in approximately 8 weeks.  8. DM2 -03/01/2020 A1c 7.  Continue to follow with PCP.  If additional agent needed consider SGLT2 I for cardioprotective benefit.  9. ED -previously prescribed Cialis by his primary care provider.  Discouraged use due to recovery from recent STEMI.  We discussed that he may not use nitroglycerin within 24 hours of utilizing Cialis.  Ultimately would avoid ED agents.  10. Hypomagnesia - Noted during recent admissions.  Check magnesium  level today.  11. Mild to moderate MR -by echo 03/17/2020.  Continue optimal blood pressure control, as above.  *Short term disability paperwork provided by his employer was filled out on day of visit. Successfully faxed. Paperwork copied to be scanned to chart for record. Given note to return to work 04/06/20 without restriction. He will pick up original paperwork 03/24/20.  Disposition: Follow up in 6 week(s) with Dr. Kirke Corin or APP   Alver Sorrow, NP 03/23/2020, 3:27 PM

## 2020-03-23 NOTE — Patient Instructions (Addendum)
Medication Instructions:  No medication changes today. Refills were sent to your pharmacy.   *If you need a refill on your cardiac medications before your next appointment, please call your pharmacy*  Lab Work: Your physician recommends lab work today: BMET, magnesium  If you have labs (blood work) drawn today and your tests are completely normal, you will receive your results only by: Marland Kitchen MyChart Message (if you have MyChart) OR . A paper copy in the mail If you have any lab test that is abnormal or we need to change your treatment, we will call you to review the results.   Testing/Procedures: Your EKG today showed normal sinus rhythm with an occasional early beat in the bottom chambers of your heart.  Please wear your monitor for 2 weeks and remove on 04/06/20.   Follow-Up: At [facility], you and your health needs are our priority.  As part of our continuing mission to provide you with exceptional heart care, we have created designated Provider Care Teams.  These Care Teams include your primary Cardiologist (physician) and Advanced Practice Providers (APPs -  Physician Assistants and Nurse Practitioners) who all work together to provide you with the care you need, when you need it.  We recommend signing up for the patient portal called "MyChart".  Sign up information is provided on this After Visit Summary.  MyChart is used to connect with patients for Virtual Visits (Telemedicine).  Patients are able to view lab/test results, encounter notes, upcoming appointments, etc.  Non-urgent messages can be sent to your provider as well.   To learn more about what you can do with MyChart, go to [email or web address].    Your next appointment:   6 week(s)  The format for your next appointment:   In Person  Provider:   You may see Lorine Bears, MD or one of the following Advanced Practice Providers on your designated Care Team:    Nicolasa Ducking, NP  Eula Listen, PA-C  Marisue Ivan, PA-C  Gillian Shields, NP  Other Instructions  Do not take Nitroglycerin if you have used Cialis within the last 24 hours.    You have been referred to cardiac rehab. This is a combination program including monitored exercise, dietary education, and support group. We strongly recommend participating in the program. Expect a phone call from them in approximately 2 weeks. If you do not hear from them, the phone number for cardiac rehab at [facility] is [phone].    Gillian Shields, NP will fill out your paperwork and fax to your company. We will have the paper copy ready for you to pick up tomorrow.

## 2020-03-24 ENCOUNTER — Telehealth: Payer: Self-pay | Admitting: *Deleted

## 2020-03-24 LAB — BASIC METABOLIC PANEL
BUN/Creatinine Ratio: 22 — ABNORMAL HIGH (ref 9–20)
BUN: 23 mg/dL (ref 6–24)
CO2: 17 mmol/L — ABNORMAL LOW (ref 20–29)
Calcium: 9 mg/dL (ref 8.7–10.2)
Chloride: 103 mmol/L (ref 96–106)
Creatinine, Ser: 1.04 mg/dL (ref 0.76–1.27)
GFR calc Af Amer: 95 mL/min/{1.73_m2} (ref >59)
GFR calc non Af Amer: 82 mL/min/{1.73_m2} (ref >59)
Glucose: 171 mg/dL — ABNORMAL HIGH (ref 65–99)
Potassium: 4.3 mmol/L (ref 3.5–5.2)
Sodium: 139 mmol/L (ref 134–144)

## 2020-03-24 LAB — MAGNESIUM: Magnesium: 1.6 mg/dL (ref 1.6–2.3)

## 2020-03-24 MED ORDER — MAGNESIUM OXIDE 400 MG PO CAPS: 400.0000 mg | ORAL_CAPSULE | Freq: Every day | ORAL | 0 refills | Status: AC

## 2020-03-24 NOTE — Telephone Encounter (Signed)
-----   Message from Alver Sorrow, NP sent at 03/24/2020  9:35 AM EDT ----- Magnesium low normal. Recommend magnesium 400mg  daily (can purchase Mag Ox over the counter). Kidney function stable. Potassium normal.

## 2020-03-24 NOTE — Telephone Encounter (Signed)
Results called to pt. Pt verbalized understanding of results and is agreeable to pick up OTC magnesium oxide 400 mg once a day.

## 2020-04-08 ENCOUNTER — Encounter: Payer: Commercial Managed Care - PPO | Attending: Cardiovascular Disease | Admitting: *Deleted

## 2020-04-08 ENCOUNTER — Other Ambulatory Visit: Payer: Self-pay

## 2020-04-08 DIAGNOSIS — I213 ST elevation (STEMI) myocardial infarction of unspecified site: Secondary | ICD-10-CM

## 2020-04-08 NOTE — Progress Notes (Signed)
Initial telephone orientation completed. Diagnosis can be found in [facility] 6/21. EP orienation scheduled for Thursday, 8/5 at 2:30

## 2020-04-14 ENCOUNTER — Other Ambulatory Visit: Payer: Self-pay

## 2020-04-14 ENCOUNTER — Encounter: Payer: Commercial Managed Care - PPO | Attending: Cardiovascular Disease

## 2020-04-14 VITALS — Ht 72.5 in | Wt 253.0 lb

## 2020-04-14 DIAGNOSIS — I252 Old myocardial infarction: Secondary | ICD-10-CM | POA: Diagnosis present

## 2020-04-14 DIAGNOSIS — Z955 Presence of coronary angioplasty implant and graft: Secondary | ICD-10-CM | POA: Insufficient documentation

## 2020-04-14 DIAGNOSIS — I213 ST elevation (STEMI) myocardial infarction of unspecified site: Secondary | ICD-10-CM

## 2020-04-14 NOTE — Patient Instructions (Addendum)
Patient Instructions  Patient Details  Name: Joshua Holder MRN: [id number] Date of Birth: [birth year] Referring Provider:  Iran Ouch, MD  Below are your personal goals for exercise, nutrition, and risk factors. Our goal is to help you stay on track towards obtaining and maintaining these goals. We will be discussing your progress on these goals with you throughout the program.  Initial Exercise Prescription:  Initial Exercise Prescription - 04/14/20 1700      Date of Initial Exercise RX and Referring Provider   Date 04/14/20    Referring Provider Lorine Bears, MD      Treadmill   MPH 2.7    Grade 0.5    Minutes 15    METs 3.25      NuStep   Level 3    SPM 80    Minutes 15    METs 4.3      Arm Ergometer   Level 1    Watts --    RPM 30    Minutes 15    METs 4.3      REL-XR   Level 3    Speed 50    Minutes 15    METs 4.3      T5 Nustep   Level 2    SPM 80    Minutes 15    METs 4.3      Biostep-RELP   Level 3    SPM 50    Minutes 15    METs 4.3      Prescription Details   Frequency (times per week) 2    Duration Progress to 30 minutes of continuous aerobic without signs/symptoms of physical distress      Intensity   THRR 40-80% of Max Heartrate 117-151    Ratings of Perceived Exertion 11-13    Perceived Dyspnea 0-4      Progression   Progression Continue to progress workloads to maintain intensity without signs/symptoms of physical distress.      Resistance Training   Training Prescription Yes    Weight 4 lb    Reps 10-15           Exercise Goals: Frequency: Be able to perform aerobic exercise two to three times per week in program working toward 2-5 days per week of home exercise.  Intensity: Work with a perceived exertion of 11 (fairly light) - 15 (hard) while following your exercise prescription.  We will make changes to your prescription with you as you progress through the program.   Duration: Be able to do 30 to 45 minutes  of continuous aerobic exercise in addition to a 5 minute warm-up and a 5 minute cool-down routine.   Nutrition Goals: Your personal nutrition goals will be established when you do your nutrition analysis with the dietician.  The following are general nutrition guidelines to follow: Cholesterol < 200mg /day Sodium < 1500mg /day Fiber: Men over 50 yrs - 30 grams per day  Personal Goals:  Personal Goals and Risk Factors at Admission - 04/14/20 1652      Core Components/Risk Factors/Patient Goals on Admission    Weight Management Yes;Weight Loss    Intervention Weight Management: Develop a combined nutrition and exercise program designed to reach desired caloric intake, while maintaining appropriate intake of nutrient and fiber, sodium and fats, and appropriate energy expenditure required for the weight goal.;Weight Management: Provide education and appropriate resources to help participant work on and attain dietary goals.;Weight Management/Obesity: Establish reasonable short term and long term weight goals.  Admit Weight 253 lb (114.8 kg)    Goal Weight: Short Term 248 lb (112.5 kg)    Goal Weight: Long Term 243 lb (110.2 kg)    Expected Outcomes Short Term: Continue to assess and modify interventions until short term weight is achieved;Long Term: Adherence to nutrition and physical activity/exercise program aimed toward attainment of established weight goal;Weight Loss: Understanding of general recommendations for a balanced deficit meal plan, which promotes 1-2 lb weight loss per week and includes a negative energy balance of [phone] kcal/d;Understanding recommendations for meals to include 15-35% energy as protein, 25-35% energy from fat, 35-60% energy from carbohydrates, less than 200mg  of dietary cholesterol, 20-35 gm of total fiber daily;Understanding of distribution of calorie intake throughout the day with the consumption of 4-5 meals/snacks    Diabetes Yes    Intervention Provide  education about signs/symptoms and action to take for hypo/hyperglycemia.;Provide education about proper nutrition, including hydration, and aerobic/resistive exercise prescription along with prescribed medications to achieve blood glucose in normal ranges: Fasting glucose 65-99 mg/dL    Expected Outcomes Short Term: Participant verbalizes understanding of the signs/symptoms and immediate care of hyper/hypoglycemia, proper foot care and importance of medication, aerobic/resistive exercise and nutrition plan for blood glucose control.;Long Term: Attainment of HbA1C < 7%.    Hypertension Yes    Intervention Provide education on lifestyle modifcations including regular physical activity/exercise, weight management, moderate sodium restriction and increased consumption of fresh fruit, vegetables, and low fat dairy, alcohol moderation, and smoking cessation.;Monitor prescription use compliance.    Expected Outcomes Short Term: Continued assessment and intervention until BP is < 140/43mm HG in hypertensive participants. < 130/1mm HG in hypertensive participants with diabetes, heart failure or chronic kidney disease.;Long Term: Maintenance of blood pressure at goal levels.           Tobacco Use Initial Evaluation: Social History   Tobacco Use  Smoking Status Never Smoker  Smokeless Tobacco Never Used    Exercise Goals and Review:  Exercise Goals    Row Name 04/14/20 1650             Exercise Goals   Increase Physical Activity Yes       Intervention Provide advice, education, support and counseling about physical activity/exercise needs.;Develop an individualized exercise prescription for aerobic and resistive training based on initial evaluation findings, risk stratification, comorbidities and participant's personal goals.       Expected Outcomes Short Term: Attend rehab on a regular basis to increase amount of physical activity.;Long Term: Add in home exercise to make exercise part of routine  and to increase amount of physical activity.;Long Term: Exercising regularly at least 3-5 days a week.       Increase Strength and Stamina Yes       Intervention Provide advice, education, support and counseling about physical activity/exercise needs.;Develop an individualized exercise prescription for aerobic and resistive training based on initial evaluation findings, risk stratification, comorbidities and participant's personal goals.       Expected Outcomes Short Term: Increase workloads from initial exercise prescription for resistance, speed, and METs.;Short Term: Perform resistance training exercises routinely during rehab and add in resistance training at home;Long Term: Improve cardiorespiratory fitness, muscular endurance and strength as measured by increased METs and functional capacity (06/14/20)       Able to understand and use rate of perceived exertion (RPE) scale Yes       Intervention Provide education and explanation on how to use RPE scale  Expected Outcomes Short Term: Able to use RPE daily in rehab to express subjective intensity level;Long Term:  Able to use RPE to guide intensity level when exercising independently       Able to understand and use Dyspnea scale Yes       Intervention Provide education and explanation on how to use Dyspnea scale       Expected Outcomes Short Term: Able to use Dyspnea scale daily in rehab to express subjective sense of shortness of breath during exertion;Long Term: Able to use Dyspnea scale to guide intensity level when exercising independently       Knowledge and understanding of Target Heart Rate Range (THRR) Yes       Intervention Provide education and explanation of THRR including how the numbers were predicted and where they are located for reference       Expected Outcomes Short Term: Able to state/look up THRR;Short Term: Able to use daily as guideline for intensity in rehab;Long Term: Able to use THRR to govern intensity when exercising  independently       Able to check pulse independently Yes       Intervention Provide education and demonstration on how to check pulse in carotid and radial arteries.;Review the importance of being able to check your own pulse for safety during independent exercise       Expected Outcomes Short Term: Able to explain why pulse checking is important during independent exercise;Long Term: Able to check pulse independently and accurately       Understanding of Exercise Prescription Yes       Intervention Provide education, explanation, and written materials on patient's individual exercise prescription       Expected Outcomes Short Term: Able to explain program exercise prescription;Long Term: Able to explain home exercise prescription to exercise independently              Copy of goals given to participant.

## 2020-04-14 NOTE — Progress Notes (Signed)
Cardiac Individual Treatment Plan  Patient Details  Name: Joshua Holder MRN: [id number] Date of Birth: 15-Apr-1966 Referring Provider:     Cardiac Rehab from 04/14/2020 in [facility] Cardiac and Pulmonary Rehab  Referring Provider Kathlyn Sacramento, MD      Initial Encounter Date:    Cardiac Rehab from 04/14/2020 in [facility] Cardiac and Pulmonary Rehab  Date 04/14/20      Visit Diagnosis: ST elevation myocardial infarction (STEMI), unspecified artery ([place])  Patient's Home Medications on Admission:  Current Outpatient Medications:  .  atorvastatin (LIPITOR) 80 MG tablet, Take 1 tablet (80 mg total) by mouth daily., Disp: 90 tablet, Rfl: 1 .  colchicine 0.6 MG tablet, Take 1 tablet (0.6 mg total) by mouth daily for 10 days., Disp: 10 tablet, Rfl: 0 .  Magnesium Oxide 400 MG CAPS, Take 1 capsule (400 mg total) by mouth daily., Disp: , Rfl: 0 .  metFORMIN (GLUCOPHAGE-XR) 500 MG 24 hr tablet, Take 1,000 mg by mouth daily with breakfast., Disp: , Rfl:  .  metoprolol tartrate (LOPRESSOR) 25 MG tablet, Take 1 tablet (25 mg total) by mouth 2 (two) times daily., Disp: 180 tablet, Rfl: 1 .  nitroGLYCERIN (NITROSTAT) 0.4 MG SL tablet, Place 1 tablet (0.4 mg total) under the tongue every 5 (five) minutes as needed for chest pain., Disp: 30 tablet, Rfl: 0 .  tadalafil (CIALIS) 5 MG tablet, Take 5 mg by mouth daily as needed for erectile dysfunction., Disp: , Rfl:  .  tamsulosin (FLOMAX) 0.4 MG CAPS capsule, Take 0.4 mg by mouth daily., Disp: , Rfl:  .  ticagrelor (BRILINTA) 90 MG TABS tablet, Take 1 tablet (90 mg total) by mouth 2 (two) times daily., Disp: 180 tablet, Rfl: 1  Past Medical History: Past Medical History:  Diagnosis Date  . Atrial fibrillation with RVR ([place]) 02/29/2020   (a) <48 hours in setting of inferior STEMI  . Combined systolic and diastolic heart failure ([place]) 02/29/2020  . Coronary artery disease 02/29/2020   (a) 02/29/20 late presenting inferior STEMI thrombotic occlusion RCA with L-R  collaterals treated medically  . Diabetes 1.5, managed as type 2 ([place])   . Gout   . HLD (hyperlipidemia) 02/29/2020  . Hypertension   . Ischemic cardiomyopathy 02/29/2020   (a) EF during cath 03/01/20 35-40% (b) Echo 03/01/20 EF 50-55% (c) Echo 03/17/20 EF 50%, gr2DD  . MI (myocardial infarction) ([place])     Tobacco Use: Social History   Tobacco Use  Smoking Status Never Smoker  Smokeless Tobacco Never Used    Labs: Recent Review Flowsheet Data    Labs for ITP Cardiac and Pulmonary Rehab Latest Ref Rng & Units 12/30/2018 03/01/2020   Cholestrol 0 - 200 mg/dL - 208(H)   LDLCALC 0 - 99 mg/dL - 133(H)   HDL >40 mg/dL - 49   Trlycerides <150 mg/dL - 128   Hemoglobin A1c 4.8 - 5.6 % 7.7(H) 7.0(H)       Exercise Target Goals: Exercise Program Goal: Individual exercise prescription set using results from initial 6 min walk test and THRR while considering  patient's activity barriers and safety.   Exercise Prescription Goal: Initial exercise prescription builds to 30-45 minutes a day of aerobic activity, 2-3 days per week.  Home exercise guidelines will be given to patient during program as part of exercise prescription that the participant will acknowledge.   Education: Aerobic Exercise & Resistance Training: - Gives group verbal and written instruction on the various components of exercise. Focuses on aerobic and resistive  training programs and the benefits of this training and how to safely progress through these programs..   Education: Exercise & Equipment Safety: - Individual verbal instruction and demonstration of equipment use and safety with use of the equipment.   Cardiac Rehab from 04/14/2020 in [facility] Cardiac and Pulmonary Rehab  Date 04/14/20  Educator [place]  Instruction Review Code 1- Verbalizes Understanding      Education: Exercise Physiology & General Exercise Guidelines: - Group verbal and written instruction with models to review the exercise physiology of the  cardiovascular system and associated critical values. Provides general exercise guidelines with specific guidelines to those with heart or lung disease.    Education: Flexibility, Balance, Mind/Body Relaxation: Provides group verbal/written instruction on the benefits of flexibility and balance training, including mind/body exercise modes such as yoga, pilates and tai chi.  Demonstration and skill practice provided.   Activity Barriers & Risk Stratification:  Activity Barriers & Cardiac Risk Stratification - 04/14/20 1715      Activity Barriers & Cardiac Risk Stratification   Activity Barriers Other (comment)    Comments left leg- rod from knee to ankle (May 2000), left knee pain    Cardiac Risk Stratification High           6 Minute Walk:  6 Minute Walk    Row Name 04/14/20 1646         6 Minute Walk   Phase Initial     Distance 1553 feet     Walk Time 6 minutes     # of Rest Breaks 0     MPH 2.94     METS 4.38     RPE 11     Perceived Dyspnea  1     VO2 Peak 15.34     Symptoms Yes (comment)     Comments Left knee pain 2/10     Resting HR 84 bpm     Resting BP 122/86     Resting Oxygen Saturation  97 %     Exercise Oxygen Saturation  during 6 min walk 98 %     Max Ex. HR 133 bpm     Max Ex. BP 140/86     2 Minute Post BP 118/88            Oxygen Initial Assessment:   Oxygen Re-Evaluation:   Oxygen Discharge (Final Oxygen Re-Evaluation):   Initial Exercise Prescription:  Initial Exercise Prescription - 04/14/20 1700      Date of Initial Exercise RX and Referring Provider   Date 04/14/20    Referring Provider Kathlyn Sacramento, MD      Treadmill   MPH 2.7    Grade 0.5    Minutes 15    METs 3.25      Recumbant Bike   Level 3    RPM 60    Minutes 15    METs 4.3      NuStep   Level 3    SPM 80    Minutes 15    METs 4.3      Arm Ergometer   Level 1    Watts --    RPM 30    Minutes 15    METs 4.3      REL-XR   Level 3    Speed 50     Minutes 15    METs 4.3      T5 Nustep   Level 2    SPM 80    Minutes 15  METs 4.3      Biostep-RELP   Level 3    SPM 50    Minutes 15    METs 4.3      Prescription Details   Frequency (times per week) 2    Duration Progress to 30 minutes of continuous aerobic without signs/symptoms of physical distress      Intensity   THRR 40-80% of Max Heartrate 117-151    Ratings of Perceived Exertion 11-13    Perceived Dyspnea 0-4      Progression   Progression Continue to progress workloads to maintain intensity without signs/symptoms of physical distress.      Resistance Training   Training Prescription Yes    Weight 4 lb    Reps 10-15           Perform Capillary Blood Glucose checks as needed.  Exercise Prescription Changes:  Exercise Prescription Changes    Row Name 04/14/20 1600 04/14/20 1700           Response to Exercise   Blood Pressure (Admit) 122/86 122/86      Blood Pressure (Exercise) 140/86 140/86      Blood Pressure (Exit) 118/88 118/88      Heart Rate (Admit) 84 bpm 84 bpm      Heart Rate (Exercise) 133 bpm 133 bpm      Heart Rate (Exit) 94 bpm 94 bpm      Oxygen Saturation (Admit) 97 % 97 %      Oxygen Saturation (Exercise) 98 % 98 %      Oxygen Saturation (Exit) 97 % 97 %      Rating of Perceived Exertion (Exercise) 11 11      Perceived Dyspnea (Exercise) 1 1      Symptoms Left knee pain 2/10 Left knee pain 2/10      Comments walk test results walk test results      Duration Progress to 30 minutes of  aerobic without signs/symptoms of physical distress Progress to 30 minutes of  aerobic without signs/symptoms of physical distress        Resistance Training   Weight 4 lb --      Reps 10-15 --             Exercise Comments:   Exercise Goals and Review:  Exercise Goals    Row Name 04/14/20 1650             Exercise Goals   Increase Physical Activity Yes       Intervention Provide advice, education, support and counseling about  physical activity/exercise needs.;Develop an individualized exercise prescription for aerobic and resistive training based on initial evaluation findings, risk stratification, comorbidities and participant's personal goals.       Expected Outcomes Short Term: Attend rehab on a regular basis to increase amount of physical activity.;Long Term: Add in home exercise to make exercise part of routine and to increase amount of physical activity.;Long Term: Exercising regularly at least 3-5 days a week.       Increase Strength and Stamina Yes       Intervention Provide advice, education, support and counseling about physical activity/exercise needs.;Develop an individualized exercise prescription for aerobic and resistive training based on initial evaluation findings, risk stratification, comorbidities and participant's personal goals.       Expected Outcomes Short Term: Increase workloads from initial exercise prescription for resistance, speed, and METs.;Short Term: Perform resistance training exercises routinely during rehab and add in resistance training at home;Long Term:  Improve cardiorespiratory fitness, muscular endurance and strength as measured by increased METs and functional capacity (6MWT)       Able to understand and use rate of perceived exertion (RPE) scale Yes       Intervention Provide education and explanation on how to use RPE scale       Expected Outcomes Short Term: Able to use RPE daily in rehab to express subjective intensity level;Long Term:  Able to use RPE to guide intensity level when exercising independently       Able to understand and use Dyspnea scale Yes       Intervention Provide education and explanation on how to use Dyspnea scale       Expected Outcomes Short Term: Able to use Dyspnea scale daily in rehab to express subjective sense of shortness of breath during exertion;Long Term: Able to use Dyspnea scale to guide intensity level when exercising independently       Knowledge  and understanding of Target Heart Rate Range (THRR) Yes       Intervention Provide education and explanation of THRR including how the numbers were predicted and where they are located for reference       Expected Outcomes Short Term: Able to state/look up THRR;Short Term: Able to use daily as guideline for intensity in rehab;Long Term: Able to use THRR to govern intensity when exercising independently       Able to check pulse independently Yes       Intervention Provide education and demonstration on how to check pulse in carotid and radial arteries.;Review the importance of being able to check your own pulse for safety during independent exercise       Expected Outcomes Short Term: Able to explain why pulse checking is important during independent exercise;Long Term: Able to check pulse independently and accurately       Understanding of Exercise Prescription Yes       Intervention Provide education, explanation, and written materials on patient's individual exercise prescription       Expected Outcomes Short Term: Able to explain program exercise prescription;Long Term: Able to explain home exercise prescription to exercise independently              Exercise Goals Re-Evaluation :   Discharge Exercise Prescription (Final Exercise Prescription Changes):  Exercise Prescription Changes - 04/14/20 1700      Response to Exercise   Blood Pressure (Admit) 122/86    Blood Pressure (Exercise) 140/86    Blood Pressure (Exit) 118/88    Heart Rate (Admit) 84 bpm    Heart Rate (Exercise) 133 bpm    Heart Rate (Exit) 94 bpm    Oxygen Saturation (Admit) 97 %    Oxygen Saturation (Exercise) 98 %    Oxygen Saturation (Exit) 97 %    Rating of Perceived Exertion (Exercise) 11    Perceived Dyspnea (Exercise) 1    Symptoms Left knee pain 2/10    Comments walk test results    Duration Progress to 30 minutes of  aerobic without signs/symptoms of physical distress           Nutrition:  Target  Goals: Understanding of nutrition guidelines, daily intake of sodium '1500mg'$ , cholesterol '200mg'$ , calories 30% from fat and 7% or less from saturated fats, daily to have 5 or more servings of fruits and vegetables.  Education: Controlling Sodium/Reading Food Labels -Group verbal and written material supporting the discussion of sodium use in heart healthy nutrition. Review and explanation with models,  verbal and written materials for utilization of the food label.   Education: General Nutrition Guidelines/Fats and Fiber: -Group instruction provided by verbal, written material, models and posters to present the general guidelines for heart healthy nutrition. Gives an explanation and review of dietary fats and fiber.   Biometrics:  Pre Biometrics - 04/14/20 1640      Pre Biometrics   Height 6' 0.5" (1.842 m)    Weight 253 lb (114.8 kg)   Verbal from patient   BMI (Calculated) 33.82    Single Leg Stand 30 seconds            Nutrition Therapy Plan and Nutrition Goals:   Nutrition Assessments:   MEDIFICTS Score Key:          ?70 Need to make dietary changes          40-70 Heart Healthy Diet         ? 40 Therapeutic Level Cholesterol Diet  Nutrition Goals Re-Evaluation:   Nutrition Goals Discharge (Final Nutrition Goals Re-Evaluation):   Psychosocial: Target Goals: Acknowledge presence or absence of significant depression and/or stress, maximize coping skills, provide positive support system. Participant is able to verbalize types and ability to use techniques and skills needed for reducing stress and depression.   Education: Depression - Provides group verbal and written instruction on the correlation between heart/lung disease and depressed mood, treatment options, and the stigmas associated with seeking treatment.   Education: Sleep Hygiene -Provides group verbal and written instruction about how sleep can affect your health.  Define sleep hygiene, discuss sleep cycles  and impact of sleep habits. Review good sleep hygiene tips.     Education: Stress and Anxiety: - Provides group verbal and written instruction about the health risks of elevated stress and causes of high stress.  Discuss the correlation between heart/lung disease and anxiety and treatment options. Review healthy ways to manage with stress and anxiety.    Initial Review & Psychosocial Screening:  Initial Psych Review & Screening - 04/08/20 1440      Initial Review   Current issues with Current Stress Concerns;Current Sleep Concerns    Source of Stress Concerns Occupation      [place]? Yes   wife     Barriers   Psychosocial barriers to participate in program There are no identifiable barriers or psychosocial needs.;The patient should benefit from training in stress management and relaxation.      Screening Interventions   Interventions Encouraged to exercise;To provide support and resources with identified psychosocial needs;Provide feedback about the scores to participant    Expected Outcomes Short Term goal: Utilizing psychosocial counselor, staff and physician to assist with identification of specific Stressors or current issues interfering with healing process. Setting desired goal for each stressor or current issue identified.;Long Term Goal: Stressors or current issues are controlled or eliminated.;Short Term goal: Identification and review with participant of any Quality of Life or Depression concerns found by scoring the questionnaire.;Long Term goal: The participant improves quality of Life and PHQ9 Scores as seen by post scores and/or verbalization of changes           Quality of Life Scores:   Quality of Life - 04/14/20 1639      Quality of Life   Select Quality of Life      Quality of Life Scores   Health/Function Pre 14.46 %    Socioeconomic Pre 20 %    Psych/Spiritual Pre 19.71 %  Family Pre 21.6 %    GLOBAL Pre 17.83 %           Scores of 19 and below usually indicate a poorer quality of life in these areas.  A difference of  2-3 points is a clinically meaningful difference.  A difference of 2-3 points in the total score of the Quality of Life Index has been associated with significant improvement in overall quality of life, self-image, physical symptoms, and general health in studies assessing change in quality of life.  PHQ-9: Recent Review Flowsheet Data    Depression screen [facility] 2/9 04/14/2020   Decreased Interest 1   Down, Depressed, Hopeless 1   PHQ - 2 Score 2   Altered sleeping 0   Tired, decreased energy 1   Change in appetite 1   Feeling bad or failure about yourself  1   Trouble concentrating 0   Moving slowly or fidgety/restless 0   Suicidal thoughts 0   PHQ-9 Score 5   Difficult doing work/chores Somewhat difficult     Interpretation of Total Score  Total Score Depression Severity:  1-4 = Minimal depression, 5-9 = Mild depression, 10-14 = Moderate depression, 15-19 = Moderately severe depression, 20-27 = Severe depression   Psychosocial Evaluation and Intervention:  Psychosocial Evaluation - 04/08/20 1455      Psychosocial Evaluation & Interventions   Interventions Relaxation education;Stress management education;Encouraged to exercise with the program and follow exercise prescription    Comments 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 He states he is a little nervous getting back into the night shift routine. His wife is helping him eat healthier and they both are looking forward to meeting with the dietician. His heart attack came as a surprise to him and he really wants to work hard to maintain a heart healthy lifestyle.    Expected Outcomes Short: attend cardiac rehab for education and exercise. Long; develop and maintain positive self care habits.    Continue Psychosocial Services  Follow up required by staff           Psychosocial  Re-Evaluation:   Psychosocial Discharge (Final Psychosocial Re-Evaluation):   Vocational Rehabilitation: Provide vocational rehab assistance to qualifying candidates.   Vocational Rehab Evaluation & Intervention:  Vocational Rehab - 04/08/20 1440      Initial Vocational Rehab Evaluation & Intervention   Assessment shows need for Vocational Rehabilitation No           Education: Education Goals: Education classes will be provided on a variety of topics geared toward better understanding of heart health and risk factor modification. Participant will state understanding/return demonstration of topics presented as noted by education test scores.  Learning Barriers/Preferences:  Learning Barriers/Preferences - 04/08/20 1440      Learning Barriers/Preferences   Learning Barriers None    Learning Preferences None           General Cardiac Education Topics:  AED/CPR: - Group verbal and written instruction with the use of models to demonstrate the basic use of the AED with the basic ABC's of resuscitation.   Anatomy & Physiology of the Heart: - Group verbal and written instruction and models provide basic cardiac anatomy and physiology, with the coronary electrical and arterial systems. Review of Valvular disease and Heart Failure   Cardiac Procedures: - Group verbal and written instruction to review commonly prescribed medications for heart disease. Reviews the medication, class of the drug, and side effects. Includes the steps  to properly store meds and maintain the prescription regimen. (beta blockers and nitrates)   Cardiac Medications I: - Group verbal and written instruction to review commonly prescribed medications for heart disease. Reviews the medication, class of the drug, and side effects. Includes the steps to properly store meds and maintain the prescription regimen.   Cardiac Medications II: -Group verbal and written instruction to review commonly prescribed  medications for heart disease. Reviews the medication, class of the drug, and side effects. (all other drug classes)    Go Sex-Intimacy & Heart Disease, Get SMART - Goal Setting: - Group verbal and written instruction through game format to discuss heart disease and the return to sexual intimacy. Provides group verbal and written material to discuss and apply goal setting through the application of the S.M.A.R.T. Method.   Other Matters of the Heart: - Provides group verbal, written materials and models to describe Stable Angina and Peripheral Artery. Includes description of the disease process and treatment options available to the cardiac patient.   Infection Prevention: - Provides verbal and written material to individual with discussion of infection control including proper hand washing and proper equipment cleaning during exercise session.   Cardiac Rehab from 04/14/2020 in [facility] Cardiac and Pulmonary Rehab  Date 04/14/20  Educator [place]  Instruction Review Code 1- Verbalizes Understanding      Falls Prevention: - Provides verbal and written material to individual with discussion of falls prevention and safety.   Cardiac Rehab from 04/14/2020 in [facility] Cardiac and Pulmonary Rehab  Date 04/14/20  Educator [place]  Instruction Review Code 1- Verbalizes Understanding      Other: -Provides group and verbal instruction on various topics (see comments)   Knowledge Questionnaire Score:  Knowledge Questionnaire Score - 04/14/20 1633      Knowledge Questionnaire Score   Pre Score 20/26: A&P, Angina, Nutrition, Exercise           Core Components/Risk Factors/Patient Goals at Admission:  Personal Goals and Risk Factors at Admission - 04/14/20 1652      Core Components/Risk Factors/Patient Goals on Admission    Weight Management Yes;Weight Loss    Intervention Weight Management: Develop a combined nutrition and exercise program designed to reach desired caloric intake, while maintaining  appropriate intake of nutrient and fiber, sodium and fats, and appropriate energy expenditure required for the weight goal.;Weight Management: Provide education and appropriate resources to help participant work on and attain dietary goals.;Weight Management/Obesity: Establish reasonable short term and long term weight goals.    Admit Weight 253 lb (114.8 kg)    Goal Weight: Short Term 248 lb (112.5 kg)    Goal Weight: Long Term 243 lb (110.2 kg)    Expected Outcomes Short Term: Continue to assess and modify interventions until short term weight is achieved;Long Term: Adherence to nutrition and physical activity/exercise program aimed toward attainment of established weight goal;Weight Loss: Understanding of general recommendations for a balanced deficit meal plan, which promotes 1-2 lb weight loss per week and includes a negative energy balance of [phone] kcal/d;Understanding recommendations for meals to include 15-35% energy as protein, 25-35% energy from fat, 35-60% energy from carbohydrates, less than '200mg'$  of dietary cholesterol, 20-35 gm of total fiber daily;Understanding of distribution of calorie intake throughout the day with the consumption of 4-5 meals/snacks    Diabetes Yes    Intervention Provide education about signs/symptoms and action to take for hypo/hyperglycemia.;Provide education about proper nutrition, including hydration, and aerobic/resistive exercise prescription along with prescribed medications to achieve  blood glucose in normal ranges: Fasting glucose 65-99 mg/dL    Expected Outcomes Short Term: Participant verbalizes understanding of the signs/symptoms and immediate care of hyper/hypoglycemia, proper foot care and importance of medication, aerobic/resistive exercise and nutrition plan for blood glucose control.;Long Term: Attainment of HbA1C < 7%.    Hypertension Yes    Intervention Provide education on lifestyle modifcations including regular physical activity/exercise, weight  management, moderate sodium restriction and increased consumption of fresh fruit, vegetables, and low fat dairy, alcohol moderation, and smoking cessation.;Monitor prescription use compliance.    Expected Outcomes Short Term: Continued assessment and intervention until BP is < 140/48m HG in hypertensive participants. < 130/859mHG in hypertensive participants with diabetes, heart failure or chronic kidney disease.;Long Term: Maintenance of blood pressure at goal levels.           Education:Diabetes - Individual verbal and written instruction to review signs/symptoms of diabetes, desired ranges of glucose level fasting, after meals and with exercise. Acknowledge that pre and post exercise glucose checks will be done for 3 sessions at entry of program.   [facility]  Date 04/14/20  Educator KLCanadianInstruction Review Code 1- Verbalizes Understanding      Education: Know Your Numbers and Risk Factors: -Group verbal and written instruction about important numbers in your health.  Discussion of what are risk factors and how they play a role in the disease process.  Review of Cholesterol, Blood Pressure, Diabetes, and BMI and the role they play in your overall health.   Core Components/Risk Factors/Patient Goals Review:    Core Components/Risk Factors/Patient Goals at Discharge (Final Review):    ITP Comments:  ITP Comments    Row Name 04/08/20 1450 04/14/20 1645         ITP Comments Initial telephone orientation completed. [facility] EP orienation scheduled for Thursday, 8/5 at 2:30 Completed 6MWT and gym orientation. Initial ITP created and sent for review to Dr. MaEmily FilbertMedical Director.             Comments: Initial ITP

## 2020-04-18 ENCOUNTER — Encounter: Payer: Commercial Managed Care - PPO | Admitting: *Deleted

## 2020-04-18 ENCOUNTER — Other Ambulatory Visit: Payer: Self-pay

## 2020-04-18 DIAGNOSIS — I213 ST elevation (STEMI) myocardial infarction of unspecified site: Secondary | ICD-10-CM

## 2020-04-18 DIAGNOSIS — I252 Old myocardial infarction: Secondary | ICD-10-CM | POA: Diagnosis not present

## 2020-04-18 LAB — GLUCOSE, CAPILLARY
Glucose-Capillary: 135 mg/dL — ABNORMAL HIGH (ref 70–99)
Glucose-Capillary: 111 mg/dL — ABNORMAL HIGH (ref 70–99)

## 2020-04-18 NOTE — Progress Notes (Signed)
Daily Session Note  Patient Details  Name: Joshua Holder MRN: [id number] Date of Birth: [birth year] Referring Provider:     Cardiac Rehab from 04/14/2020 in [facility]  Referring Provider Kathlyn Sacramento, MD      Encounter Date: 04/18/2020  Check In:  Session Check In - 04/18/20 1538      Check-In   Supervising physician immediately available to respond to emergencies See telemetry face sheet for immediately available ER MD    Location [facility]    Staff Present Renita Papa, RN Margurite Auerbach, MS Exercise Physiologist;Kelly Amedeo Plenty, BS, ACSM CEP, Exercise Physiologist    Virtual Visit No    Medication changes reported     No    Fall or balance concerns reported    No    Warm-up and Cool-down Performed on first and last piece of equipment    Resistance Training Performed Yes    VAD Patient? No    PAD/SET Patient? No      Pain Assessment   Currently in Pain? No/denies              Social History   Tobacco Use  Smoking Status Never Smoker  Smokeless Tobacco Never Used    Goals Met:  Independence with exercise equipment Exercise tolerated well No report of cardiac concerns or symptoms Strength training completed today  Goals Unmet:  Not Applicable  Comments: First full day of exercise!  Patient was oriented to gym and equipment including functions, settings, policies, and procedures.  Patient's individual exercise prescription and treatment plan were reviewed.  All starting workloads were established based on the results of the 6 minute walk test done at initial orientation visit.  The plan for exercise progression was also introduced and progression will be customized based on patient's performance and goals.    Dr. Emily Filbert is Medical Director for [place] [facility].

## 2020-04-20 ENCOUNTER — Encounter: Payer: Self-pay | Admitting: *Deleted

## 2020-04-20 DIAGNOSIS — I213 ST elevation (STEMI) myocardial infarction of unspecified site: Secondary | ICD-10-CM

## 2020-04-20 NOTE — Progress Notes (Signed)
Cardiac Individual Treatment Plan  Patient Details  Name: Joshua Holder MRN: [id number] Date of Birth: 15-Apr-1966 Referring Provider:     Cardiac Rehab from 04/14/2020 in [facility] Cardiac and Pulmonary Rehab  Referring Provider Kathlyn Sacramento, MD      Initial Encounter Date:    Cardiac Rehab from 04/14/2020 in [facility] Cardiac and Pulmonary Rehab  Date 04/14/20      Visit Diagnosis: ST elevation myocardial infarction (STEMI), unspecified artery ([place])  Patient's Home Medications on Admission:  Current Outpatient Medications:  .  atorvastatin (LIPITOR) 80 MG tablet, Take 1 tablet (80 mg total) by mouth daily., Disp: 90 tablet, Rfl: 1 .  colchicine 0.6 MG tablet, Take 1 tablet (0.6 mg total) by mouth daily for 10 days., Disp: 10 tablet, Rfl: 0 .  Magnesium Oxide 400 MG CAPS, Take 1 capsule (400 mg total) by mouth daily., Disp: , Rfl: 0 .  metFORMIN (GLUCOPHAGE-XR) 500 MG 24 hr tablet, Take 1,000 mg by mouth daily with breakfast., Disp: , Rfl:  .  metoprolol tartrate (LOPRESSOR) 25 MG tablet, Take 1 tablet (25 mg total) by mouth 2 (two) times daily., Disp: 180 tablet, Rfl: 1 .  nitroGLYCERIN (NITROSTAT) 0.4 MG SL tablet, Place 1 tablet (0.4 mg total) under the tongue every 5 (five) minutes as needed for chest pain., Disp: 30 tablet, Rfl: 0 .  tadalafil (CIALIS) 5 MG tablet, Take 5 mg by mouth daily as needed for erectile dysfunction., Disp: , Rfl:  .  tamsulosin (FLOMAX) 0.4 MG CAPS capsule, Take 0.4 mg by mouth daily., Disp: , Rfl:  .  ticagrelor (BRILINTA) 90 MG TABS tablet, Take 1 tablet (90 mg total) by mouth 2 (two) times daily., Disp: 180 tablet, Rfl: 1  Past Medical History: Past Medical History:  Diagnosis Date  . Atrial fibrillation with RVR ([place]) 02/29/2020   (a) <48 hours in setting of inferior STEMI  . Combined systolic and diastolic heart failure ([place]) 02/29/2020  . Coronary artery disease 02/29/2020   (a) 02/29/20 late presenting inferior STEMI thrombotic occlusion RCA with L-R  collaterals treated medically  . Diabetes 1.5, managed as type 2 ([place])   . Gout   . HLD (hyperlipidemia) 02/29/2020  . Hypertension   . Ischemic cardiomyopathy 02/29/2020   (a) EF during cath 03/01/20 35-40% (b) Echo 03/01/20 EF 50-55% (c) Echo 03/17/20 EF 50%, gr2DD  . MI (myocardial infarction) ([place])     Tobacco Use: Social History   Tobacco Use  Smoking Status Never Smoker  Smokeless Tobacco Never Used    Labs: Recent Review Flowsheet Data    Labs for ITP Cardiac and Pulmonary Rehab Latest Ref Rng & Units 12/30/2018 03/01/2020   Cholestrol 0 - 200 mg/dL - 208(H)   LDLCALC 0 - 99 mg/dL - 133(H)   HDL >40 mg/dL - 49   Trlycerides <150 mg/dL - 128   Hemoglobin A1c 4.8 - 5.6 % 7.7(H) 7.0(H)       Exercise Target Goals: Exercise Program Goal: Individual exercise prescription set using results from initial 6 min walk test and THRR while considering  patient's activity barriers and safety.   Exercise Prescription Goal: Initial exercise prescription builds to 30-45 minutes a day of aerobic activity, 2-3 days per week.  Home exercise guidelines will be given to patient during program as part of exercise prescription that the participant will acknowledge.   Education: Aerobic Exercise & Resistance Training: - Gives group verbal and written instruction on the various components of exercise. Focuses on aerobic and resistive  training programs and the benefits of this training and how to safely progress through these programs..   Education: Exercise & Equipment Safety: - Individual verbal instruction and demonstration of equipment use and safety with use of the equipment.   Cardiac Rehab from 04/14/2020 in [facility] Cardiac and Pulmonary Rehab  Date 04/14/20  Educator [place]  Instruction Review Code 1- Verbalizes Understanding      Education: Exercise Physiology & General Exercise Guidelines: - Group verbal and written instruction with models to review the exercise physiology of the  cardiovascular system and associated critical values. Provides general exercise guidelines with specific guidelines to those with heart or lung disease.    Education: Flexibility, Balance, Mind/Body Relaxation: Provides group verbal/written instruction on the benefits of flexibility and balance training, including mind/body exercise modes such as yoga, pilates and tai chi.  Demonstration and skill practice provided.   Activity Barriers & Risk Stratification:  Activity Barriers & Cardiac Risk Stratification - 04/14/20 1715      Activity Barriers & Cardiac Risk Stratification   Activity Barriers Other (comment)    Comments left leg- rod from knee to ankle (May 2000), left knee pain    Cardiac Risk Stratification High           6 Minute Walk:  6 Minute Walk    Row Name 04/14/20 1646         6 Minute Walk   Phase Initial     Distance 1553 feet     Walk Time 6 minutes     # of Rest Breaks 0     MPH 2.94     METS 4.38     RPE 11     Perceived Dyspnea  1     VO2 Peak 15.34     Symptoms Yes (comment)     Comments Left knee pain 2/10     Resting HR 84 bpm     Resting BP 122/86     Resting Oxygen Saturation  97 %     Exercise Oxygen Saturation  during 6 min walk 98 %     Max Ex. HR 133 bpm     Max Ex. BP 140/86     2 Minute Post BP 118/88            Oxygen Initial Assessment:   Oxygen Re-Evaluation:   Oxygen Discharge (Final Oxygen Re-Evaluation):   Initial Exercise Prescription:  Initial Exercise Prescription - 04/14/20 1700      Date of Initial Exercise RX and Referring Provider   Date 04/14/20    Referring Provider Kathlyn Sacramento, MD      Treadmill   MPH 2.7    Grade 0.5    Minutes 15    METs 3.25      Recumbant Bike   Level 3    RPM 60    Minutes 15    METs 4.3      NuStep   Level 3    SPM 80    Minutes 15    METs 4.3      Arm Ergometer   Level 1    [place] --    RPM 30    Minutes 15    METs 4.3      REL-XR   Level 3    Speed 50     Minutes 15    METs 4.3      T5 Nustep   Level 2    SPM 80    Minutes 15  METs 4.3      Biostep-RELP   Level 3    SPM 50    Minutes 15    METs 4.3      Prescription Details   Frequency (times per week) 2    Duration Progress to 30 minutes of continuous aerobic without signs/symptoms of physical distress      Intensity   THRR 40-80% of Max Heartrate 117-151    Ratings of Perceived Exertion 11-13    Perceived Dyspnea 0-4      Progression   Progression Continue to progress workloads to maintain intensity without signs/symptoms of physical distress.      Resistance Training   Training Prescription Yes    Weight 4 lb    Reps 10-15           Perform Capillary Blood Glucose checks as needed.  Exercise Prescription Changes:  Exercise Prescription Changes    Row Name 04/14/20 1600 04/14/20 1700           Response to Exercise   Blood Pressure (Admit) 122/86 122/86      Blood Pressure (Exercise) 140/86 140/86      Blood Pressure (Exit) 118/88 118/88      Heart Rate (Admit) 84 bpm 84 bpm      Heart Rate (Exercise) 133 bpm 133 bpm      Heart Rate (Exit) 94 bpm 94 bpm      Oxygen Saturation (Admit) 97 % 97 %      Oxygen Saturation (Exercise) 98 % 98 %      Oxygen Saturation (Exit) 97 % 97 %      Rating of Perceived Exertion (Exercise) 11 11      Perceived Dyspnea (Exercise) 1 1      Symptoms Left knee pain 2/10 Left knee pain 2/10      Comments walk test results walk test results      Duration Progress to 30 minutes of  aerobic without signs/symptoms of physical distress Progress to 30 minutes of  aerobic without signs/symptoms of physical distress        Resistance Training   Weight 4 lb --      Reps 10-15 --             Exercise Comments:   Exercise Goals and Review:  Exercise Goals    Row Name 04/14/20 1650             Exercise Goals   Increase Physical Activity Yes       Intervention Provide advice, education, support and counseling about  physical activity/exercise needs.;Develop an individualized exercise prescription for aerobic and resistive training based on initial evaluation findings, risk stratification, comorbidities and participant's personal goals.       Expected Outcomes Short Term: Attend rehab on a regular basis to increase amount of physical activity.;Long Term: Add in home exercise to make exercise part of routine and to increase amount of physical activity.;Long Term: Exercising regularly at least 3-5 days a week.       Increase Strength and Stamina Yes       Intervention Provide advice, education, support and counseling about physical activity/exercise needs.;Develop an individualized exercise prescription for aerobic and resistive training based on initial evaluation findings, risk stratification, comorbidities and participant's personal goals.       Expected Outcomes Short Term: Increase workloads from initial exercise prescription for resistance, speed, and METs.;Short Term: Perform resistance training exercises routinely during rehab and add in resistance training at home;Long Term:  Improve cardiorespiratory fitness, muscular endurance and strength as measured by increased METs and functional capacity (6MWT)       Able to understand and use rate of perceived exertion (RPE) scale Yes       Intervention Provide education and explanation on how to use RPE scale       Expected Outcomes Short Term: Able to use RPE daily in rehab to express subjective intensity level;Long Term:  Able to use RPE to guide intensity level when exercising independently       Able to understand and use Dyspnea scale Yes       Intervention Provide education and explanation on how to use Dyspnea scale       Expected Outcomes Short Term: Able to use Dyspnea scale daily in rehab to express subjective sense of shortness of breath during exertion;Long Term: Able to use Dyspnea scale to guide intensity level when exercising independently       Knowledge  and understanding of Target Heart Rate Range (THRR) Yes       Intervention Provide education and explanation of THRR including how the numbers were predicted and where they are located for reference       Expected Outcomes Short Term: Able to state/look up THRR;Short Term: Able to use daily as guideline for intensity in rehab;Long Term: Able to use THRR to govern intensity when exercising independently       Able to check pulse independently Yes       Intervention Provide education and demonstration on how to check pulse in carotid and radial arteries.;Review the importance of being able to check your own pulse for safety during independent exercise       Expected Outcomes Short Term: Able to explain why pulse checking is important during independent exercise;Long Term: Able to check pulse independently and accurately       Understanding of Exercise Prescription Yes       Intervention Provide education, explanation, and written materials on patient's individual exercise prescription       Expected Outcomes Short Term: Able to explain program exercise prescription;Long Term: Able to explain home exercise prescription to exercise independently              Exercise Goals Re-Evaluation :  Exercise Goals Re-Evaluation    [place] Name 04/18/20 1539             Exercise Goal Re-Evaluation   Exercise Goals Review Increase Physical Activity;Able to understand and use rate of perceived exertion (RPE) scale;Knowledge and understanding of Target Heart Rate Range (THRR);Understanding of Exercise Prescription;Increase Strength and Stamina;Able to check pulse independently       Comments Reviewed RPE and dyspnea scales, THR and program prescription with pt today.  Pt voiced understanding and was given a copy of goals to take home.       Expected Outcomes Short: Use RPE daily to regulate intensity. Long: Follow program prescription in THR.              Discharge Exercise Prescription (Final Exercise  Prescription Changes):  Exercise Prescription Changes - 04/14/20 1700      Response to Exercise   Blood Pressure (Admit) 122/86    Blood Pressure (Exercise) 140/86    Blood Pressure (Exit) 118/88    Heart Rate (Admit) 84 bpm    Heart Rate (Exercise) 133 bpm    Heart Rate (Exit) 94 bpm    Oxygen Saturation (Admit) 97 %    Oxygen Saturation (Exercise) 98 %  Oxygen Saturation (Exit) 97 %    Rating of Perceived Exertion (Exercise) 11    Perceived Dyspnea (Exercise) 1    Symptoms Left knee pain 2/10    Comments walk test results    Duration Progress to 30 minutes of  aerobic without signs/symptoms of physical distress           Nutrition:  Target Goals: Understanding of nutrition guidelines, daily intake of sodium <1565m, cholesterol <2066m calories 30% from fat and 7% or less from saturated fats, daily to have 5 or more servings of fruits and vegetables.  Education: Controlling Sodium/Reading Food Labels -Group verbal and written material supporting the discussion of sodium use in heart healthy nutrition. Review and explanation with models, verbal and written materials for utilization of the food label.   Education: General Nutrition Guidelines/Fats and Fiber: -Group instruction provided by verbal, written material, models and posters to present the general guidelines for heart healthy nutrition. Gives an explanation and review of dietary fats and fiber.   Biometrics:  Pre Biometrics - 04/14/20 1640      Pre Biometrics   Height 6' 0.5" (1.842 m)    Weight 253 lb (114.8 kg)   Verbal from patient   BMI (Calculated) 33.82    Single Leg Stand 30 seconds            Nutrition Therapy Plan and Nutrition Goals:   Nutrition Assessments:   MEDIFICTS Score Key:          ?70 Need to make dietary changes          40-70 Heart Healthy Diet         ? 40 Therapeutic Level Cholesterol Diet  Nutrition Goals Re-Evaluation:   Nutrition Goals Discharge (Final Nutrition Goals  Re-Evaluation):   Psychosocial: Target Goals: Acknowledge presence or absence of significant depression and/or stress, maximize coping skills, provide positive support system. Participant is able to verbalize types and ability to use techniques and skills needed for reducing stress and depression.   Education: Depression - Provides group verbal and written instruction on the correlation between heart/lung disease and depressed mood, treatment options, and the stigmas associated with seeking treatment.   Education: Sleep Hygiene -Provides group verbal and written instruction about how sleep can affect your health.  Define sleep hygiene, discuss sleep cycles and impact of sleep habits. Review good sleep hygiene tips.     Education: Stress and Anxiety: - Provides group verbal and written instruction about the health risks of elevated stress and causes of high stress.  Discuss the correlation between heart/lung disease and anxiety and treatment options. Review healthy ways to manage with stress and anxiety.    Initial Review & Psychosocial Screening:  Initial Psych Review & Screening - 04/08/20 1440      Initial Review   Current issues with Current Stress Concerns;Current Sleep Concerns    Source of Stress Concerns Occupation      [place]   wife     Barriers   Psychosocial barriers to participate in program There are no identifiable barriers or psychosocial needs.;The patient should benefit from training in stress management and relaxation.      Screening Interventions   Interventions Encouraged to exercise;To provide support and resources with identified psychosocial needs;Provide feedback about the scores to participant    Expected Outcomes Short Term goal: Utilizing psychosocial counselor, staff and physician to assist with identification of specific Stressors or current issues interfering with healing process. Setting  desired goal for each  stressor or current issue identified.;Long Term Goal: Stressors or current issues are controlled or eliminated.;Short Term goal: Identification and review with participant of any Quality of Life or Depression concerns found by scoring the questionnaire.;Long Term goal: The participant improves quality of Life and PHQ9 Scores as seen by post scores and/or verbalization of changes           Quality of Life Scores:   Quality of Life - 04/14/20 1639      Quality of Life   Select Quality of Life      Quality of Life Scores   Health/Function Pre 14.46 %    Socioeconomic Pre 20 %    Psych/Spiritual Pre 19.71 %    Family Pre 21.6 %    GLOBAL Pre 17.83 %          Scores of 19 and below usually indicate a poorer quality of life in these areas.  A difference of  2-3 points is a clinically meaningful difference.  A difference of 2-3 points in the total score of the Quality of Life Index has been associated with significant improvement in overall quality of life, self-image, physical symptoms, and general health in studies assessing change in quality of life.  PHQ-9: Recent Review Flowsheet Data    Depression screen [facility] 2/9 04/14/2020   Decreased Interest 1   Down, Depressed, Hopeless 1   PHQ - 2 Score 2   Altered sleeping 0   Tired, decreased energy 1   Change in appetite 1   Feeling bad or failure about yourself  1   Trouble concentrating 0   Moving slowly or fidgety/restless 0   Suicidal thoughts 0   PHQ-9 Score 5   Difficult doing work/chores Somewhat difficult     Interpretation of Total Score  Total Score Depression Severity:  1-4 = Minimal depression, 5-9 = Mild depression, 10-14 = Moderate depression, 15-19 = Moderately severe depression, 20-27 = Severe depression   Psychosocial Evaluation and Intervention:  Psychosocial Evaluation - 04/08/20 1455      Psychosocial Evaluation & Interventions   Interventions Relaxation education;Stress management education;Encouraged to  exercise with the program and follow exercise prescription    Comments Joshua Holder 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 He states he is a little nervous getting back into the night shift routine. His wife is helping him eat healthier and they both are looking forward to meeting with the dietician. His heart attack came as a surprise to him and he really wants to work hard to maintain a heart healthy lifestyle.    Expected Outcomes Short: attend cardiac rehab for education and exercise. Long; develop and maintain positive self care habits.    Continue Psychosocial Services  Follow up required by staff           Psychosocial Re-Evaluation:   Psychosocial Discharge (Final Psychosocial Re-Evaluation):   Vocational Rehabilitation: Provide vocational rehab assistance to qualifying candidates.   Vocational Rehab Evaluation & Intervention:  Vocational Rehab - 04/08/20 1440      Initial Vocational Rehab Evaluation & Intervention   Assessment shows need for Vocational Rehabilitation No           Education: Education Goals: Education classes will be provided on a variety of topics geared toward better understanding of heart health and risk factor modification. Participant will state understanding/return demonstration of topics presented as noted by education test scores.  Learning Barriers/Preferences:  Learning Barriers/Preferences - 04/08/20  1440      Learning Barriers/Preferences   Learning Barriers None    Learning Preferences None           General Cardiac Education Topics:  AED/CPR: - Group verbal and written instruction with the use of models to demonstrate the basic use of the AED with the basic ABC's of resuscitation.   Anatomy & Physiology of the Heart: - Group verbal and written instruction and models provide basic cardiac anatomy and physiology, with the coronary electrical and arterial systems. Review of Valvular disease and Heart  Failure   Cardiac Procedures: - Group verbal and written instruction to review commonly prescribed medications for heart disease. Reviews the medication, class of the drug, and side effects. Includes the steps to properly store meds and maintain the prescription regimen. (beta blockers and nitrates)   Cardiac Medications I: - Group verbal and written instruction to review commonly prescribed medications for heart disease. Reviews the medication, class of the drug, and side effects. Includes the steps to properly store meds and maintain the prescription regimen.   Cardiac Medications II: -Group verbal and written instruction to review commonly prescribed medications for heart disease. Reviews the medication, class of the drug, and side effects. (all other drug classes)    Go Sex-Intimacy & Heart Disease, Get SMART - Goal Setting: - Group verbal and written instruction through game format to discuss heart disease and the return to sexual intimacy. Provides group verbal and written material to discuss and apply goal setting through the application of the S.M.A.R.T. Method.   Other Matters of the Heart: - Provides group verbal, written materials and models to describe Stable Angina and Peripheral Artery. Includes description of the disease process and treatment options available to the cardiac patient.   Infection Prevention: - Provides verbal and written material to individual with discussion of infection control including proper hand washing and proper equipment cleaning during exercise session.   Cardiac Rehab from 04/14/2020 in [facility] Cardiac and Pulmonary Rehab  Date 04/14/20  Educator [place]  Instruction Review Code 1- Verbalizes Understanding      Falls Prevention: - Provides verbal and written material to individual with discussion of falls prevention and safety.   Cardiac Rehab from 04/14/2020 in [facility] Cardiac and Pulmonary Rehab  Date 04/14/20  Educator [place]  Instruction Review Code 1-  Verbalizes Understanding      Other: -Provides group and verbal instruction on various topics (see comments)   Knowledge Questionnaire Score:  Knowledge Questionnaire Score - 04/14/20 1633      Knowledge Questionnaire Score   Pre Score 20/26: A&P, Angina, Nutrition, Exercise           Core Components/Risk Factors/Patient Goals at Admission:  Personal Goals and Risk Factors at Admission - 04/14/20 1652      Core Components/Risk Factors/Patient Goals on Admission    Weight Management Yes;Weight Loss    Intervention Weight Management: Develop a combined nutrition and exercise program designed to reach desired caloric intake, while maintaining appropriate intake of nutrient and fiber, sodium and fats, and appropriate energy expenditure required for the weight goal.;Weight Management: Provide education and appropriate resources to help participant work on and attain dietary goals.;Weight Management/Obesity: Establish reasonable short term and long term weight goals.    Admit Weight 253 lb (114.8 kg)    Goal Weight: Short Term 248 lb (112.5 kg)    Goal Weight: Long Term 243 lb (110.2 kg)    Expected Outcomes Short Term: Continue to assess and modify interventions  until short term weight is achieved;Long Term: Adherence to nutrition and physical activity/exercise program aimed toward attainment of established weight goal;Weight Loss: Understanding of general recommendations for a balanced deficit meal plan, which promotes 1-2 lb weight loss per week and includes a negative energy balance of [phone] kcal/d;Understanding recommendations for meals to include 15-35% energy as protein, 25-35% energy from fat, 35-60% energy from carbohydrates, less than 296m of dietary cholesterol, 20-35 gm of total fiber daily;Understanding of distribution of calorie intake throughout the day with the consumption of 4-5 meals/snacks    Diabetes Yes    Intervention Provide education about signs/symptoms and action  to take for hypo/hyperglycemia.;Provide education about proper nutrition, including hydration, and aerobic/resistive exercise prescription along with prescribed medications to achieve blood glucose in normal ranges: Fasting glucose 65-99 mg/dL    Expected Outcomes Short Term: Participant verbalizes understanding of the signs/symptoms and immediate care of hyper/hypoglycemia, proper foot care and importance of medication, aerobic/resistive exercise and nutrition plan for blood glucose control.;Long Term: Attainment of HbA1C < 7%.    Hypertension Yes    Intervention Provide education on lifestyle modifcations including regular physical activity/exercise, weight management, moderate sodium restriction and increased consumption of fresh fruit, vegetables, and low fat dairy, alcohol moderation, and smoking cessation.;Monitor prescription use compliance.    Expected Outcomes Short Term: Continued assessment and intervention until BP is < 140/929mHG in hypertensive participants. < 130/8044mG in hypertensive participants with diabetes, heart failure or chronic kidney disease.;Long Term: Maintenance of blood pressure at goal levels.           Education:Diabetes - Individual verbal and written instruction to review signs/symptoms of diabetes, desired ranges of glucose level fasting, after meals and with exercise. Acknowledge that pre and post exercise glucose checks will be done for 3 sessions at entry of program.   [facility]  Date 04/14/20  Educator KL Pathforknstruction Review Code 1- Verbalizes Understanding      Education: Know Your Numbers and Risk Factors: -Group verbal and written instruction about important numbers in your health.  Discussion of what are risk factors and how they play a role in the disease process.  Review of Cholesterol, Blood Pressure, Diabetes, and BMI and the role they play in your overall health.   Core Components/Risk  Factors/Patient Goals Review:    Core Components/Risk Factors/Patient Goals at Discharge (Final Review):    ITP Comments:  ITP Comments    Row Name 04/08/20 1450 04/14/20 1645 04/18/20 1538 04/20/20 0734     ITP Comments Initial telephone orientation completed. [facility] EP orienation scheduled for Thursday, 8/5 at 2:30 Completed 6MWT and gym orientation. Initial ITP created and sent for review to Dr. MarEmily Filbertedical Director. First full day of exercise!  Patient was oriented to gym and equipment including functions, settings, policies, and procedures.  Patient's individual exercise prescription and treatment plan were reviewed.  All starting workloads were established based on the results of the 6 minute walk test done at initial orientation visit.  The plan for exercise progression was also introduced and progression will be customized based on patient's performance and goals. 30 Day review completed. Medical Director ITP review done, changes made as directed, and signed approval by Medical Director.           Comments:

## 2020-04-26 ENCOUNTER — Telehealth: Payer: Self-pay | Admitting: *Deleted

## 2020-04-26 NOTE — Telephone Encounter (Signed)
Results called to pt. Pt verbalized understanding.  

## 2020-04-26 NOTE — Telephone Encounter (Signed)
Left voicemail message to call back  

## 2020-04-26 NOTE — Telephone Encounter (Signed)
-----   Message from Alver Sorrow, NP sent at 04/25/2020  7:03 PM EDT ----- Monitor showed predominantly normal sinus rhythm. No evidence of atrial fibrillation or flutter - good result! Most triggered events were sinus tachycardia.

## 2020-04-27 ENCOUNTER — Other Ambulatory Visit: Payer: Self-pay

## 2020-04-27 ENCOUNTER — Encounter: Payer: Commercial Managed Care - PPO | Admitting: *Deleted

## 2020-04-27 DIAGNOSIS — I213 ST elevation (STEMI) myocardial infarction of unspecified site: Secondary | ICD-10-CM

## 2020-04-27 DIAGNOSIS — I252 Old myocardial infarction: Secondary | ICD-10-CM | POA: Diagnosis not present

## 2020-04-27 LAB — GLUCOSE, CAPILLARY
Glucose-Capillary: 177 mg/dL — ABNORMAL HIGH (ref 70–99)
Glucose-Capillary: 123 mg/dL — ABNORMAL HIGH (ref 70–99)

## 2020-04-27 NOTE — Progress Notes (Signed)
Daily Session Note  Patient Details  Name: Joshua Holder MRN: [id number] Date of Birth: [birth year] Referring Provider:     Cardiac Rehab from 04/14/2020 in [facility] Cardiac and Pulmonary Rehab  Referring Provider Kathlyn Sacramento, MD      Encounter Date: 04/27/2020  Check In:  Session Check In - 04/27/20 1756      Check-In   Supervising physician immediately available to respond to emergencies See telemetry face sheet for immediately available ER MD    Location [facility]-Cardiac & Pulmonary Rehab    Staff Present Nyoka Cowden, RN, BSN, MA;Joseph Hood [place]ren [place], [place] Exercise Physiologist    Virtual Visit No    Medication changes reported     No    Fall or balance concerns reported    No    Warm-up and Cool-down Performed on first and last piece of equipment    Resistance Training Performed Yes    VAD Patient? No    PAD/SET Patient? No      Pain Assessment   Currently in Pain? No/denies              Social History   Tobacco Use  Smoking Status Never Smoker  Smokeless Tobacco Never Used    Goals Met:  Independence with exercise equipment Exercise tolerated well No report of cardiac concerns or symptoms Strength training completed today  Goals Unmet:  Not Applicable  Comments: Pt able to follow exercise prescription today without complaint.  Will continue to monitor for progression.   Dr. Emily Filbert is Medical Director for [facility].

## 2020-04-28 ENCOUNTER — Other Ambulatory Visit: Payer: Self-pay

## 2020-04-28 ENCOUNTER — Encounter: Payer: Commercial Managed Care - PPO | Admitting: *Deleted

## 2020-04-28 DIAGNOSIS — I252 Old myocardial infarction: Secondary | ICD-10-CM | POA: Diagnosis not present

## 2020-04-28 DIAGNOSIS — I213 ST elevation (STEMI) myocardial infarction of unspecified site: Secondary | ICD-10-CM

## 2020-04-28 LAB — GLUCOSE, CAPILLARY
Glucose-Capillary: 120 mg/dL — ABNORMAL HIGH (ref 70–99)
Glucose-Capillary: 136 mg/dL — ABNORMAL HIGH (ref 70–99)

## 2020-04-28 NOTE — Progress Notes (Signed)
Daily Session Note  Patient Details  Name: Joshua Holder MRN: [id number] Date of Birth: [birth year] Referring Provider:     Cardiac Rehab from 04/14/2020 in [facility] Cardiac and Pulmonary Rehab  Referring Provider Kathlyn Sacramento, MD      Encounter Date: 04/28/2020  Check In:  Session Check In - 04/28/20 1618      Check-In   Supervising physician immediately available to respond to emergencies See telemetry face sheet for immediately available ER MD    Location [facility]-Cardiac & Pulmonary Rehab    Staff Present Nyoka Cowden, RN, BSN, MA;Joseph Hood RCP,RRT,BSRT;Kelly Amedeo Plenty, BS, ACSM CEP, Exercise Physiologist;Amanda Oletta Darter, [place], ACSM CEP, Exercise Physiologist    Virtual Visit No    Medication changes reported     No    Fall or balance concerns reported    No    Warm-up and Cool-down Performed on first and last piece of equipment    Resistance Training Performed Yes    VAD Patient? No    PAD/SET Patient? No              Social History   Tobacco Use  Smoking Status Never Smoker  Smokeless Tobacco Never Used    Goals Met:  Independence with exercise equipment Exercise tolerated well No report of cardiac concerns or symptoms Strength training completed today  Goals Unmet:  Not Applicable  Comments: Pt able to follow exercise prescription today without complaint.  Will continue to monitor for progression.   Dr. Emily Filbert is Medical Director for [facility].

## 2020-05-06 ENCOUNTER — Ambulatory Visit: Payer: Commercial Managed Care - PPO

## 2020-05-06 ENCOUNTER — Ambulatory Visit: Payer: Commercial Managed Care - PPO | Admitting: Family

## 2020-05-09 ENCOUNTER — Emergency Department: Payer: Commercial Managed Care - PPO

## 2020-05-09 ENCOUNTER — Other Ambulatory Visit: Payer: Self-pay

## 2020-05-09 ENCOUNTER — Emergency Department
Admission: EM | Admit: 2020-05-09 | Discharge: 2020-05-09 | Disposition: A | Discharge status: Home / Self Care | Payer: Commercial Managed Care - PPO | Attending: Emergency Medicine | Admitting: Emergency Medicine

## 2020-05-09 ENCOUNTER — Encounter: Payer: Self-pay | Admitting: *Deleted

## 2020-05-09 DIAGNOSIS — R5383 Other fatigue: Secondary | ICD-10-CM | POA: Insufficient documentation

## 2020-05-09 DIAGNOSIS — R0789 Other chest pain: Secondary | ICD-10-CM | POA: Diagnosis not present

## 2020-05-09 DIAGNOSIS — R519 Headache, unspecified: Secondary | ICD-10-CM | POA: Insufficient documentation

## 2020-05-09 DIAGNOSIS — R0602 Shortness of breath: Secondary | ICD-10-CM | POA: Diagnosis not present

## 2020-05-09 DIAGNOSIS — R05 Cough: Secondary | ICD-10-CM | POA: Insufficient documentation

## 2020-05-09 DIAGNOSIS — I213 ST elevation (STEMI) myocardial infarction of unspecified site: Secondary | ICD-10-CM

## 2020-05-09 DIAGNOSIS — Z5321 Procedure and treatment not carried out due to patient leaving prior to being seen by health care provider: Secondary | ICD-10-CM | POA: Insufficient documentation

## 2020-05-09 LAB — CBC
HCT: 39.5 % (ref 39.0–52.0)
Hemoglobin: 14.1 g/dL (ref 13.0–17.0)
MCH: 29.1 pg (ref 26.0–34.0)
MCHC: 35.7 g/dL (ref 30.0–36.0)
MCV: 81.4 fL (ref 80.0–100.0)
Platelets: 199 10*3/uL (ref 150–400)
RBC: 4.85 MIL/uL (ref 4.22–5.81)
RDW: 14 % (ref 11.5–15.5)
WBC: 5.4 10*3/uL (ref 4.0–10.5)
nRBC: 0 % (ref 0.0–0.2)

## 2020-05-09 LAB — BASIC METABOLIC PANEL
Anion gap: 7 (ref 5–15)
BUN: 16 mg/dL (ref 6–20)
CO2: 26 mmol/L (ref 22–32)
Calcium: 8.6 mg/dL — ABNORMAL LOW (ref 8.9–10.3)
Chloride: 101 mmol/L (ref 98–111)
Creatinine, Ser: 0.98 mg/dL (ref 0.61–1.24)
GFR calc Af Amer: >60 mL/min (ref >60)
GFR calc non Af Amer: >60 mL/min (ref >60)
Glucose, Bld: 276 mg/dL — ABNORMAL HIGH (ref 70–99)
Potassium: 3.7 mmol/L (ref 3.5–5.1)
Sodium: 134 mmol/L — ABNORMAL LOW (ref 135–145)

## 2020-05-09 LAB — TROPONIN I (HIGH SENSITIVITY): Troponin I (High Sensitivity): 26 ng/L — ABNORMAL HIGH (ref <18)

## 2020-05-09 NOTE — ED Triage Notes (Signed)
Pt to ED for COVID symptoms. Reports head congestion, cough, fatigue. Went to UC a week ago and received a rapid negative COVID test.  Reports due to go back to work tonight, still feels bad and does not want to go back to work if + for COVID  Reports tightness in chest, and occasional [facility]

## 2020-05-11 ENCOUNTER — Other Ambulatory Visit: Payer: Self-pay

## 2020-05-11 ENCOUNTER — Encounter: Payer: Self-pay | Admitting: Nurse Practitioner

## 2020-05-11 ENCOUNTER — Ambulatory Visit (INDEPENDENT_AMBULATORY_CARE_PROVIDER_SITE_OTHER): Payer: Commercial Managed Care - PPO | Admitting: Nurse Practitioner

## 2020-05-11 ENCOUNTER — Encounter: Payer: Commercial Managed Care - PPO | Attending: Cardiovascular Disease

## 2020-05-11 VITALS — BP 118/80 | HR 88 | Ht 71.0 in | Wt 259.0 lb

## 2020-05-11 DIAGNOSIS — I2510 Atherosclerotic heart disease of native coronary artery without angina pectoris: Secondary | ICD-10-CM | POA: Diagnosis not present

## 2020-05-11 DIAGNOSIS — I252 Old myocardial infarction: Secondary | ICD-10-CM | POA: Insufficient documentation

## 2020-05-11 DIAGNOSIS — I4892 Unspecified atrial flutter: Secondary | ICD-10-CM

## 2020-05-11 DIAGNOSIS — Z955 Presence of coronary angioplasty implant and graft: Secondary | ICD-10-CM | POA: Insufficient documentation

## 2020-05-11 DIAGNOSIS — I5042 Chronic combined systolic (congestive) and diastolic (congestive) heart failure: Secondary | ICD-10-CM

## 2020-05-11 DIAGNOSIS — I25118 Atherosclerotic heart disease of native coronary artery with other forms of angina pectoris: Secondary | ICD-10-CM | POA: Diagnosis not present

## 2020-05-11 DIAGNOSIS — E785 Hyperlipidemia, unspecified: Secondary | ICD-10-CM

## 2020-05-11 DIAGNOSIS — I255 Ischemic cardiomyopathy: Secondary | ICD-10-CM

## 2020-05-11 DIAGNOSIS — I10 Essential (primary) hypertension: Secondary | ICD-10-CM

## 2020-05-11 NOTE — Patient Instructions (Signed)
Medication Instructions:  - Your physician recommends that you continue on your current medications as directed. Please refer to the Current Medication list given to you today.  *If you need a refill on your cardiac medications before your next appointment, please call your pharmacy*   Lab Work: - Your physician recommends that you have lab work today: Lipid/ Stage manager LDL  If you have labs (blood work) drawn today and your tests are completely normal, you will receive your results only by: Marland Kitchen MyChart Message (if you have MyChart) OR . A paper copy in the mail If you have any lab test that is abnormal or we need to change your treatment, we will call you to review the results.   Testing/Procedures: - none ordered   Follow-Up: At [facility], you and your health needs are our priority.  As part of our continuing mission to provide you with exceptional heart care, we have created designated Provider Care Teams.  These Care Teams include your primary Cardiologist (physician) and Advanced Practice Providers (APPs -  Physician Assistants and Nurse Practitioners) who all work together to provide you with the care you need, when you need it.  We recommend signing up for the patient portal called "MyChart".  Sign up information is provided on this After Visit Summary.  MyChart is used to connect with patients for Virtual Visits (Telemedicine).  Patients are able to view lab/test results, encounter notes, upcoming appointments, etc.  Non-urgent messages can be sent to your provider as well.   To learn more about what you can do with MyChart, go to [email or web address].    Your next appointment:   3 month(s)  The format for your next appointment:   In Person  Provider:    You may see Lorine Bears, MD or one of the following Advanced Practice Providers on your designated Care Team:    Nicolasa Ducking, NP  Eula Listen, PA-C  Marisue Ivan, PA-C    Other  Instructions n/a

## 2020-05-11 NOTE — Progress Notes (Signed)
Office Visit    Patient Name: Joshua Holder Date of Encounter: 05/11/2020  Primary Care Provider:  System, Provider Not In Primary Cardiologist:  Lorine Bears, MD  Chief Complaint    54 y/o ? CAD s/p late presenting inf STEMI 02/2020 w/ thrombotic occlusion of the RCA w/ L  R collaterals, HFimpEF, ICM, paroxysmal atrial fibrillation and flutter, etoh use, DM2, HTN, hyperlipidemia, and Gout, who presents for follow-up of CAD.  Past Medical History    Past Medical History:  Diagnosis Date  . Atrial fibrillation with RVR (HCC) 02/29/2020   a. <48 hours in setting of inferior STEMI  . 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 Coronary artery disease 02/29/2020   a. 02/29/20 late presenting inferior STEMI thrombotic occlusion RCA with L-R collaterals. PTCA attempted but unsuccessful-->Med Rx.  . 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 Gout   . HLD (hyperlipidemia) 02/29/2020  . Hypertension   . Ischemic cardiomyopathy 02/29/2020   a. EF during cath 03/01/20 35-40%; b. Echo 03/01/20 EF 50-55%; c. Echo 03/17/20 EF 50%, gr2DD  . MI (myocardial infarction) [facility])    Past Surgical History:  Procedure Laterality Date  . CORONARY/GRAFT ACUTE MI REVASCULARIZATION N/A 03/01/2020   Procedure: Coronary/Graft Acute MI Revascularization;  Surgeon: Iran Ouch, MD;  Location: [facility] INVASIVE CV LAB;  Service: Cardiovascular;  Laterality: N/A;  . INCISION AND DRAINAGE Left 12/30/2018   Procedure: INCISION AND DRAINAGE LEFT ELBOW;  Surgeon: Juanell Fairly, MD;  Location: [facility] ORS;  Service: Orthopedics;  Laterality: Left;  . LEFT HEART CATH AND CORONARY ANGIOGRAPHY N/A 03/01/2020   Procedure: LEFT HEART CATH AND CORONARY ANGIOGRAPHY;  Surgeon: Iran Ouch, MD;  Location: [facility] INVASIVE CV LAB;  Service: Cardiovascular;  Laterality: N/A;    Allergies  Allergies  Allergen Reactions  . Lisinopril Swelling and Cough    History of Present Illness     54 year old male with the above Past medical history including CAD status post late presenting inferior STEMI in June 2021 with thrombotic occlusion of the RCA with left-to-right collaterals, ischemic cardiomyopathy, heart failure with improved ejection fraction, paroxysmal atrial fibrillation and flutter, alcohol use, type 2 diabetes mellitus, hypertension, hyperlipidemia, and gout.  As noted above, he was admitted to [facility] on February 29, 2020 with chest pain and dyspnea.  ECG showed inferior ST segment elevation with associated Q waves.  He underwent diagnostic catheterization revealing severe one-vessel CAD with a thrombotic occlusion of the proximal RCA with left-to-right collaterals from the LAD.  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  EF was 35 to 40%.  Balloon angioplasty RCA was performed but was unsuccessful and medical therapy was recommended.  Post MI course was complicated by brief episode of atrial fibrillation, which converted with IV amiodarone.  He also developed sort of chest pain consistent with pericarditis and was treated with colchicine at discharge.  Unfortunate, he was readmitted July 8 with atrial flutter that resolved with IV diltiazem.  Repeat echocardiogram July 8 showed an EF of 50% with grade 2 diastolic dysfunction and normal RV size and function.  Mild to moderate MR was noted.  Given ongoing dual antiplatelet therapy, oral anticoagulation was not initiated.  He underwent 14-day monitoring as an outpatient that did not show any recurrence of atrial fibrillation or flutter.  Since his last visit in mid July, he has been doing well from a cardiac standpoint, though he  has been experiencing chest and sinus congestion.  He has been tested for Covid on several occasions, including last Friday and yesterday with each returning negative.  [facility] was [facility] in [facility] yesterday due to ongoing upper respiratory symptoms.   There, per report, ECG showed no acute changes.  ED note also indicates a bedside echo was performed and was normal.  Patient says he is feeling better today.  He denies chest pain, dyspnea, palpitations, PND, orthopnea, dizziness, syncope, edema, or early satiety.  He has been participating in cardiac rehab and doing exceptionally well.  He has significantly cut back on his drinking and is currently drinking 2 whiskey and Cokes 2 days/week.  He understands that our desires for him to stop completely.  Home Medications    Prior to Admission medications   Medication Sig Start Date End Date Taking? Authorizing Provider  atorvastatin (LIPITOR) 80 MG tablet Take 1 tablet (80 mg total) by mouth daily. 03/23/20 09/19/20  Alver Sorrow, NP  colchicine 0.6 MG tablet Take 1 tablet (0.6 mg total) by mouth daily for 10 days. 03/18/20 03/28/20  Lurene Shadow, MD  Magnesium Oxide 400 MG CAPS Take 1 capsule (400 mg total) by mouth daily. 03/24/20   Alver Sorrow, NP  metFORMIN (GLUCOPHAGE-XR) 500 MG 24 hr tablet Take 1,000 mg by mouth daily with breakfast.    [provider]  metoprolol tartrate (LOPRESSOR) 25 MG tablet Take 1 tablet (25 mg total) by mouth 2 (two) times daily. 03/23/20 09/19/20  Alver Sorrow, NP  nitroGLYCERIN (NITROSTAT) 0.4 MG SL tablet Place 1 tablet (0.4 mg total) under the tongue every 5 (five) minutes as needed for chest pain. 03/18/20   Lurene Shadow, MD  tadalafil (CIALIS) 5 MG tablet Take 5 mg by mouth daily as needed for erectile dysfunction.    [provider]  tamsulosin (FLOMAX) 0.4 MG CAPS capsule Take 0.4 mg by mouth daily. 11/07/19   [provider]  ticagrelor (BRILINTA) 90 MG TABS tablet Take 1 tablet (90 mg total) by mouth 2 (two) times daily. 03/23/20 09/19/20  Alver Sorrow, NP  Aspirin 81 mg daily  Review of Systems    Dealing with upper respiratory infection which has been improving.  He denies chest pain, dyspnea, palpitations, PND,  orthopnea, dizziness, syncope, edema, or early satiety.  All other systems reviewed and are otherwise negative except as noted above.  Physical Exam    VS:  BP 118/80 (BP Location: Left Arm, Patient Position: Sitting, Cuff Size: Normal)   Pulse 100   Ht 5\' 11"  (1.803 m)   Wt 259 lb (117.5 kg)   SpO2 98%   BMI 36.12 kg/m  , BMI Body mass index is 36.12 kg/m. GEN: Well nourished, well developed, in no acute distress. HEENT: normal. Neck: Supple, no JVD, carotid bruits, or masses. Cardiac: RRR, no murmurs, rubs, or gallops. No clubbing, cyanosis, edema.  Radials/PT 2+ and equal bilaterally.  Respiratory:  Respirations regular and unlabored, clear to auscultation bilaterally. GI: Soft, nontender, nondistended, BS + x 4. MS: no deformity or atrophy. Skin: warm and dry, no rash. Neuro:  Strength and sensation are intact. Psych: Normal affect.  Accessory Clinical Findings    Patient refused ECG today as he had one yesterday in the [facility] ED  Lab Results  Component Value Date   WBC 5.4 05/09/2020   HGB 14.1 05/09/2020   HCT 39.5 05/09/2020   MCV 81.4 05/09/2020   PLT 199 05/09/2020  Lab Results  Component Value Date   CREATININE 0.98 05/09/2020   BUN 16 05/09/2020   NA 134 (L) 05/09/2020   K 3.7 05/09/2020   CL 101 05/09/2020   CO2 26 05/09/2020   Lab Results  Component Value Date   ALT 17 03/17/2020   AST 16 03/17/2020   ALKPHOS 78 03/17/2020   BILITOT 1.4 (H) 03/17/2020   Lab Results  Component Value Date   CHOL 208 (H) 03/01/2020   HDL 49 03/01/2020   LDLCALC 133 (H) 03/01/2020   TRIG 128 03/01/2020   CHOLHDL 4.2 03/01/2020    Lab Results  Component Value Date   HGBA1C 7.0 (H) 03/01/2020    Assessment & Plan    1.  Coronary artery disease: Status post late presenting inferior STEMI in June with finding of a thrombotic occlusion of the RCA with left-to-right collaterals.  PTCA was attempted but unsuccessful and he has been medically managed.  He has not been  having any chest pain or dyspnea and has been tolerating cardiac rehabilitation well.  He remains on aspirin, Brilinta, beta-blocker, and statin therapy.  We will follow-up lipids with a direct LDL and LFTs today.  2.  History of pericarditis: No recurrent chest pain.  Completed 10-day course of colchicine following admission in June.  3.  Ischemic cardiomyopathy/heart failure with improved ejection fraction: EF 50% by echo in July, with grade 2 diastolic dysfunction.  Doing well and euvolemic on examination today.  Continue metoprolol therapy.  Pressure stable today though management previously limited by soft blood pressures.  4.  Paroxysmal atrial fibrillation/atrial flutter: Brief episode of A. fib in the setting of his MI in June followed by admission in July with a 3 to 4-hour history of palpitations and atrial flutter that broke with IV diltiazem.  Monitoring in July did not show any recurrence of A. fib or flutter.  He has not been having any palpitations.  CHA2DS2-VASc equals 4.  As previously noted, in the absence of recurrent arrhythmia and ongoing dual antiplatelet therapy, will hold off on direct oral anticoagulant therapy.  Would have a low threshold to institute in the future for recurrent arrhythmia and if recurrent atrial flutter noted, could consider EP evaluation for catheter ablation.  5.  Essential hypertension: Stable on low-dose beta-blocker therapy.  6.  Disposition: Follow-up lipids and LFTs today.  Follow-up in 3 months or sooner if necessary.  Nicolasa Ducking, NP 05/11/2020, 5:28 PM

## 2020-05-12 ENCOUNTER — Telehealth: Payer: Self-pay

## 2020-05-12 DIAGNOSIS — E785 Hyperlipidemia, unspecified: Secondary | ICD-10-CM

## 2020-05-12 LAB — HEPATIC FUNCTION PANEL
ALT: 16 IU/L (ref 0–44)
AST: 16 IU/L (ref 0–40)
Albumin: 4.5 g/dL (ref 3.8–4.9)
Alkaline Phosphatase: 74 IU/L (ref 48–121)
Bilirubin Total: 1.2 mg/dL (ref 0.0–1.2)
Bilirubin, Direct: 0.25 mg/dL (ref 0.00–0.40)
Total Protein: 6.9 g/dL (ref 6.0–8.5)

## 2020-05-12 LAB — LIPID PANEL
Chol/HDL Ratio: 5.3 ratio — ABNORMAL HIGH (ref 0.0–5.0)
Cholesterol, Total: 238 mg/dL — ABNORMAL HIGH (ref 100–199)
HDL: 45 mg/dL (ref >39)
LDL Chol Calc (NIH): 101 mg/dL — ABNORMAL HIGH (ref 0–99)
Triglycerides: 543 mg/dL — ABNORMAL HIGH (ref 0–149)
VLDL Cholesterol Cal: 92 mg/dL — ABNORMAL HIGH (ref 5–40)

## 2020-05-12 LAB — LDL CHOLESTEROL, DIRECT: LDL Direct: 126 mg/dL — ABNORMAL HIGH (ref 0–99)

## 2020-05-12 NOTE — Telephone Encounter (Signed)
Call to patient to review labs.    Pt verbalized understanding and has no further questions at this time.    Advised pt to call for any further questions or concerns.  Order placed for repeat lipid in the medical mall in 6 weeks. Fasting.

## 2020-05-12 NOTE — Telephone Encounter (Signed)
-----   Message from Creig Hines, NP sent at 05/12/2020  7:40 AM EDT ----- Lft's wnl.  Lipids are abnl.  This was a nonfasting profile, thus triglycerides are not likely accurate (prev much lower in hospital), however, direct LDL is not significantly different than LDL when hospitalized, prior to statin therapy.  Pls ensure that he is actually taking lipitor 80mg  daily.  Following strict compliance, let's plan a f/u fasting lipid profile in 6 wks.  If numbers are similar, he will need an additional agent, which can be discussed at f/u appt.

## 2020-05-13 ENCOUNTER — Telehealth: Payer: Self-pay | Admitting: Nurse Practitioner

## 2020-05-13 NOTE — Telephone Encounter (Signed)
Paperwork obtained and given to provider for review.

## 2020-05-13 NOTE — Telephone Encounter (Signed)
[employer] called to check on status of return to work

## 2020-05-13 NOTE — Telephone Encounter (Signed)
[employer] Short Term Disability forms were received. Placed in nurse box

## 2020-05-17 NOTE — Telephone Encounter (Signed)
Call attempted to see what are dates pt plans on going back to work. No answer. LMTCB.

## 2020-05-18 ENCOUNTER — Encounter: Payer: Self-pay | Admitting: *Deleted

## 2020-05-18 DIAGNOSIS — I213 ST elevation (STEMI) myocardial infarction of unspecified site: Secondary | ICD-10-CM

## 2020-05-18 NOTE — Progress Notes (Signed)
Cardiac Individual Treatment Plan  Patient Details  Name: Joshua Holder MRN: [id number] Date of Birth: 1966/07/28 Referring Provider:     Cardiac Rehab from 04/14/2020 in [facility] Cardiac and Pulmonary Rehab  Referring Provider Lorine Bears, MD      Initial Encounter Date:    Cardiac Rehab from 04/14/2020 in [facility] Cardiac and Pulmonary Rehab  Date 04/14/20      Visit Diagnosis: ST elevation myocardial infarction (STEMI), unspecified artery (HCC)  Patient's Home Medications on Admission:  Current Outpatient Medications:  .  aspirin EC 81 MG tablet, Take 1 tablet (81 mg) by mouth once daily. Swallow whole., Disp: , Rfl:  .  atorvastatin (LIPITOR) 80 MG tablet, Take 1 tablet (80 mg total) by mouth daily., Disp: 90 tablet, Rfl: 1 .  Magnesium Oxide 400 MG CAPS, Take 1 capsule (400 mg total) by mouth daily., Disp: , Rfl: 0 .  metFORMIN (GLUCOPHAGE-XR) 500 MG 24 hr tablet, Take 1,000 mg by mouth daily with breakfast., Disp: , Rfl:  .  metoprolol tartrate (LOPRESSOR) 25 MG tablet, Take 1 tablet (25 mg total) by mouth 2 (two) times daily., Disp: 180 tablet, Rfl: 1 .  nitroGLYCERIN (NITROSTAT) 0.4 MG SL tablet, Place 1 tablet (0.4 mg total) under the tongue every 5 (five) minutes as needed for chest pain., Disp: 30 tablet, Rfl: 0 .  tadalafil (CIALIS) 5 MG tablet, Take 5 mg by mouth daily as needed for erectile dysfunction., Disp: , Rfl:  .  tamsulosin (FLOMAX) 0.4 MG CAPS capsule, Take 0.4 mg by mouth daily., Disp: , Rfl:  .  ticagrelor (BRILINTA) 90 MG TABS tablet, Take 1 tablet (90 mg total) by mouth 2 (two) times daily., Disp: 180 tablet, Rfl: 1  Past Medical History: Past Medical History:  Diagnosis Date  . Atrial fibrillation with RVR (HCC) 02/29/2020   a. <48 hours in setting of inferior STEMI  . Atrial flutter (HCC)    a. 03/2020 - converted w/ IV dilt in ED; b. 03/2020 Zio: no recurrent Afib/flutter.  . 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 Coronary  artery disease 02/29/2020   a. 02/29/20 late presenting inferior STEMI thrombotic occlusion RCA with L-R collaterals. PTCA attempted but unsuccessful-->Med Rx.  . 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 Gout   . HLD (hyperlipidemia) 02/29/2020  . Hypertension   . Ischemic cardiomyopathy 02/29/2020   a. EF during cath 03/01/20 35-40%; b. Echo 03/01/20 EF 50-55%; c. Echo 03/17/20 EF 50%, gr2DD  . MI (myocardial infarction) (HCC)     Tobacco Use: Social History   Tobacco Use  Smoking Status Never Smoker  Smokeless Tobacco Never Used    Labs: Recent Review Flowsheet Data    Labs for ITP Cardiac and Pulmonary Rehab Latest Ref Rng & Units 12/30/2018 03/01/2020 05/11/2020   Cholestrol 100 - 199 mg/dL - 462(M) 638(T)   LDLCALC 0 - 99 mg/dL - 771(H) 657(X)   LDLDIRECT 0 - 99 mg/dL - - 038(B)   HDL >33 mg/dL - 49 45   Trlycerides 0 - 149 mg/dL - 832 919(T)   Hemoglobin A1c 4.8 - 5.6 % 7.7(H) 7.0(H) -       Exercise Target Goals: Exercise Program Goal: Individual exercise prescription set using results from initial 6 min walk test and THRR while considering  patient's activity barriers and safety.   Exercise Prescription Goal: Initial exercise prescription builds to 30-45 minutes a day of aerobic activity, 2-3 days per week.  Home exercise guidelines  will be given to patient during program as part of exercise prescription that the participant will acknowledge.   Education: Aerobic Exercise & Resistance Training: - Gives group verbal and written instruction on the various components of exercise. Focuses on aerobic and resistive training programs and the benefits of this training and how to safely progress through these programs..   Cardiac Rehab from 04/27/2020 in [facility] Cardiac and Pulmonary Rehab  Date 04/27/20  Educator AS  Instruction Review Code 1- Verbalizes Understanding      Education: Exercise & Equipment Safety: - Individual verbal instruction and demonstration of equipment use  and safety with use of the equipment.   Cardiac Rehab from 04/27/2020 in [facility] Cardiac and Pulmonary Rehab  Date 04/14/20  Educator [place]  Instruction Review Code 1- Verbalizes Understanding      Education: Exercise Physiology & General Exercise Guidelines: - Group verbal and written instruction with models to review the exercise physiology of the cardiovascular system and associated critical values. Provides general exercise guidelines with specific guidelines to those with heart or lung disease.    Education: Flexibility, Balance, Mind/Body Relaxation: Provides group verbal/written instruction on the benefits of flexibility and balance training, including mind/body exercise modes such as yoga, pilates and tai chi.  Demonstration and skill practice provided.   Activity Barriers & Risk Stratification:  Activity Barriers & Cardiac Risk Stratification - 04/14/20 1715      Activity Barriers & Cardiac Risk Stratification   Activity Barriers Other (comment)    Comments left leg- rod from knee to ankle (May 2000), left knee pain    Cardiac Risk Stratification High           6 Minute Walk:  6 Minute Walk    Row Name 04/14/20 1646         6 Minute Walk   Phase Initial     Distance 1553 feet     Walk Time 6 minutes     # of Rest Breaks 0     MPH 2.94     METS 4.38     RPE 11     Perceived Dyspnea  1     VO2 Peak 15.34     Symptoms Yes (comment)     Comments Left knee pain 2/10     Resting HR 84 bpm     Resting BP 122/86     Resting Oxygen Saturation  97 %     Exercise Oxygen Saturation  during 6 min walk 98 %     Max Ex. HR 133 bpm     Max Ex. BP 140/86     2 Minute Post BP 118/88            Oxygen Initial Assessment:   Oxygen Re-Evaluation:   Oxygen Discharge (Final Oxygen Re-Evaluation):   Initial Exercise Prescription:  Initial Exercise Prescription - 04/14/20 1700      Date of Initial Exercise RX and Referring Provider   Date 04/14/20    Referring  Provider Kathlyn Sacramento, MD      Treadmill   MPH 2.7    Grade 0.5    Minutes 15    METs 3.25      Recumbant Bike   Level 3    RPM 60    Minutes 15    METs 4.3      NuStep   Level 3    SPM 80    Minutes 15    METs 4.3      Arm Ergometer  Level 1    Watts --    RPM 30    Minutes 15    METs 4.3      REL-XR   Level 3    Speed 50    Minutes 15    METs 4.3      T5 Nustep   Level 2    SPM 80    Minutes 15    METs 4.3      Biostep-RELP   Level 3    SPM 50    Minutes 15    METs 4.3      Prescription Details   Frequency (times per week) 2    Duration Progress to 30 minutes of continuous aerobic without signs/symptoms of physical distress      Intensity   THRR 40-80% of Max Heartrate 117-151    Ratings of Perceived Exertion 11-13    Perceived Dyspnea 0-4      Progression   Progression Continue to progress workloads to maintain intensity without signs/symptoms of physical distress.      Resistance Training   Training Prescription Yes    Weight 4 lb    Reps 10-15           Perform Capillary Blood Glucose checks as needed.  Exercise Prescription Changes:  Exercise Prescription Changes    Row Name 04/14/20 1600 04/14/20 1700 04/27/20 1500 05/09/20 1500       Response to Exercise   Blood Pressure (Admit) 122/86 122/86 122/70 122/86    Blood Pressure (Exercise) 140/86 140/86 150/84 124/82    Blood Pressure (Exit) 118/88 118/88 126/70 122/70    Heart Rate (Admit) 84 bpm 84 bpm 92 bpm 99 bpm    Heart Rate (Exercise) 133 bpm 133 bpm 134 bpm 114 bpm    Heart Rate (Exit) 94 bpm 94 bpm 113 bpm 88 bpm    Oxygen Saturation (Admit) 97 % 97 % -- --    Oxygen Saturation (Exercise) 98 % 98 % -- --    Oxygen Saturation (Exit) 97 % 97 % -- --    Rating of Perceived Exertion (Exercise) 11 11 13 13     Perceived Dyspnea (Exercise) 1 1 -- --    Symptoms Left knee pain 2/10 Left knee pain 2/10 -- none    Comments walk test results walk test results first day --     Duration Progress to 30 minutes of  aerobic without signs/symptoms of physical distress Progress to 30 minutes of  aerobic without signs/symptoms of physical distress -- Continue with 30 min of aerobic exercise without signs/symptoms of physical distress.    Intensity -- -- -- THRR unchanged      Progression   Progression -- -- Continue to progress workloads to maintain intensity without signs/symptoms of physical distress. Continue to progress workloads to maintain intensity without signs/symptoms of physical distress.    Average METs -- -- 2.4 3.02      Resistance Training   Training Prescription -- -- Yes Yes    Weight 4 lb -- 4 lb 4 lb    Reps 10-15 -- 10-15 10-15      Interval Training   Interval Training -- -- -- No      Treadmill   MPH -- -- 2 2.7    Grade -- -- 0.5 0.5    Minutes -- -- 15 15    METs -- -- 2.67 3.25      NuStep   Level -- -- -- 3  Minutes -- -- -- 15    METs -- -- -- 2.8      T5 Nustep   Level -- -- 2 2    SPM -- -- 80 --    Minutes -- -- 15 15    METs -- -- 2.1 --      Biostep-RELP   Level -- -- -- 3    Minutes -- -- -- 15    METs -- -- -- 3           Exercise Comments:   Exercise Goals and Review:  Exercise Goals    Row Name 04/14/20 1650             Exercise Goals   Increase Physical Activity Yes       Intervention Provide advice, education, support and counseling about physical activity/exercise needs.;Develop an individualized exercise prescription for aerobic and resistive training based on initial evaluation findings, risk stratification, comorbidities and participant's personal goals.       Expected Outcomes Short Term: Attend rehab on a regular basis to increase amount of physical activity.;Long Term: Add in home exercise to make exercise part of routine and to increase amount of physical activity.;Long Term: Exercising regularly at least 3-5 days a week.       Increase Strength and Stamina Yes       Intervention Provide  advice, education, support and counseling about physical activity/exercise needs.;Develop an individualized exercise prescription for aerobic and resistive training based on initial evaluation findings, risk stratification, comorbidities and participant's personal goals.       Expected Outcomes Short Term: Increase workloads from initial exercise prescription for resistance, speed, and METs.;Short Term: Perform resistance training exercises routinely during rehab and add in resistance training at home;Long Term: Improve cardiorespiratory fitness, muscular endurance and strength as measured by increased METs and functional capacity (6MWT)       Able to understand and use rate of perceived exertion (RPE) scale Yes       Intervention Provide education and explanation on how to use RPE scale       Expected Outcomes Short Term: Able to use RPE daily in rehab to express subjective intensity level;Long Term:  Able to use RPE to guide intensity level when exercising independently       Able to understand and use Dyspnea scale Yes       Intervention Provide education and explanation on how to use Dyspnea scale       Expected Outcomes Short Term: Able to use Dyspnea scale daily in rehab to express subjective sense of shortness of breath during exertion;Long Term: Able to use Dyspnea scale to guide intensity level when exercising independently       Knowledge and understanding of Target Heart Rate Range (THRR) Yes       Intervention Provide education and explanation of THRR including how the numbers were predicted and where they are located for reference       Expected Outcomes Short Term: Able to state/look up THRR;Short Term: Able to use daily as guideline for intensity in rehab;Long Term: Able to use THRR to govern intensity when exercising independently       Able to check pulse independently Yes       Intervention Provide education and demonstration on how to check pulse in carotid and radial arteries.;Review  the importance of being able to check your own pulse for safety during independent exercise       Expected Outcomes Short Term: Able to  explain why pulse checking is important during independent exercise;Long Term: Able to check pulse independently and accurately       Understanding of Exercise Prescription Yes       Intervention Provide education, explanation, and written materials on patient's individual exercise prescription       Expected Outcomes Short Term: Able to explain program exercise prescription;Long Term: Able to explain home exercise prescription to exercise independently              Exercise Goals Re-Evaluation :  Exercise Goals Re-Evaluation    Row Name 04/18/20 1539 05/09/20 1558           Exercise Goal Re-Evaluation   Exercise Goals Review Increase Physical Activity;Able to understand and use rate of perceived exertion (RPE) scale;Knowledge and understanding of Target Heart Rate Range (THRR);Understanding of Exercise Prescription;Increase Strength and Stamina;Able to check pulse independently Increase Physical Activity;Increase Strength and Stamina;Understanding of Exercise Prescription      Comments Reviewed RPE and dyspnea scales, THR and program prescription with pt today.  Pt voiced understanding and was given a copy of goals to take home. Only attended twice since last review.  Pt currently in ED.  Will need negative test to return.  We will continue to montior his progress.      Expected Outcomes Short: Use RPE daily to regulate intensity. Long: Follow program prescription in THR. Short: Improved attendance Long: Continue to follow program prescription.             Discharge Exercise Prescription (Final Exercise Prescription Changes):  Exercise Prescription Changes - 05/09/20 1500      Response to Exercise   Blood Pressure (Admit) 122/86    Blood Pressure (Exercise) 124/82    Blood Pressure (Exit) 122/70    Heart Rate (Admit) 99 bpm    Heart Rate (Exercise)  114 bpm    Heart Rate (Exit) 88 bpm    Rating of Perceived Exertion (Exercise) 13    Symptoms none    Duration Continue with 30 min of aerobic exercise without signs/symptoms of physical distress.    Intensity THRR unchanged      Progression   Progression Continue to progress workloads to maintain intensity without signs/symptoms of physical distress.    Average METs 3.02      Resistance Training   Training Prescription Yes    Weight 4 lb    Reps 10-15      Interval Training   Interval Training No      Treadmill   MPH 2.7    Grade 0.5    Minutes 15    METs 3.25      NuStep   Level 3    Minutes 15    METs 2.8      T5 Nustep   Level 2    Minutes 15      Biostep-RELP   Level 3    Minutes 15    METs 3           Nutrition:  Target Goals: Understanding of nutrition guidelines, daily intake of sodium '1500mg'$ , cholesterol '200mg'$ , calories 30% from fat and 7% or less from saturated fats, daily to have 5 or more servings of fruits and vegetables.  Education: Controlling Sodium/Reading Food Labels -Group verbal and written material supporting the discussion of sodium use in heart healthy nutrition. Review and explanation with models, verbal and written materials for utilization of the food label.   Education: General Nutrition Guidelines/Fats and Fiber: -Group instruction provided by  verbal, written material, models and posters to present the general guidelines for heart healthy nutrition. Gives an explanation and review of dietary fats and fiber.   Biometrics:  Pre Biometrics - 04/14/20 1640      Pre Biometrics   Height 6' 0.5" (1.842 m)    Weight 253 lb (114.8 kg)   Verbal from patient   BMI (Calculated) 33.82    Single Leg Stand 30 seconds            Nutrition Therapy Plan and Nutrition Goals:   Nutrition Assessments:   MEDIFICTS Score Key:          ?70 Need to make dietary changes          40-70 Heart Healthy Diet         ? 40 Therapeutic Level  Cholesterol Diet  Nutrition Goals Re-Evaluation:   Nutrition Goals Discharge (Final Nutrition Goals Re-Evaluation):   Psychosocial: Target Goals: Acknowledge presence or absence of significant depression and/or stress, maximize coping skills, provide positive support system. Participant is able to verbalize types and ability to use techniques and skills needed for reducing stress and depression.   Education: Depression - Provides group verbal and written instruction on the correlation between heart/lung disease and depressed mood, treatment options, and the stigmas associated with seeking treatment.   Education: Sleep Hygiene -Provides group verbal and written instruction about how sleep can affect your health.  Define sleep hygiene, discuss sleep cycles and impact of sleep habits. Review good sleep hygiene tips.     Education: Stress and Anxiety: - Provides group verbal and written instruction about the health risks of elevated stress and causes of high stress.  Discuss the correlation between heart/lung disease and anxiety and treatment options. Review healthy ways to manage with stress and anxiety.    Initial Review & Psychosocial Screening:  Initial Psych Review & Screening - 04/08/20 1440      Initial Review   Current issues with Current Stress Concerns;Current Sleep Concerns    Source of Stress Concerns Occupation      [employer]? Yes   wife     Barriers   Psychosocial barriers to participate in program There are no identifiable barriers or psychosocial needs.;The patient should benefit from training in stress management and relaxation.      Screening Interventions   Interventions Encouraged to exercise;To provide support and resources with identified psychosocial needs;Provide feedback about the scores to participant    Expected Outcomes Short Term goal: Utilizing psychosocial counselor, staff and physician to assist with identification of  specific Stressors or current issues interfering with healing process. Setting desired goal for each stressor or current issue identified.;Long Term Goal: Stressors or current issues are controlled or eliminated.;Short Term goal: Identification and review with participant of any Quality of Life or Depression concerns found by scoring the questionnaire.;Long Term goal: The participant improves quality of Life and PHQ9 Scores as seen by post scores and/or verbalization of changes           Quality of Life Scores:   Quality of Life - 04/14/20 1639      Quality of Life   Select Quality of Life      Quality of Life Scores   Health/Function Pre 14.46 %    Socioeconomic Pre 20 %    Psych/Spiritual Pre 19.71 %    Family Pre 21.6 %    GLOBAL Pre 17.83 %  Scores of 19 and below usually indicate a poorer quality of life in these areas.  A difference of  2-3 points is a clinically meaningful difference.  A difference of 2-3 points in the total score of the Quality of Life Index has been associated with significant improvement in overall quality of life, self-image, physical symptoms, and general health in studies assessing change in quality of life.  PHQ-9: Recent Review Flowsheet Data    Depression screen [facility] 2/9 04/14/2020   Decreased Interest 1   Down, Depressed, Hopeless 1   PHQ - 2 Score 2   Altered sleeping 0   Tired, decreased energy 1   Change in appetite 1   Feeling bad or failure about yourself  1   Trouble concentrating 0   Moving slowly or fidgety/restless 0   Suicidal thoughts 0   PHQ-9 Score 5   Difficult doing work/chores Somewhat difficult     Interpretation of Total Score  Total Score Depression Severity:  1-4 = Minimal depression, 5-9 = Mild depression, 10-14 = Moderate depression, 15-19 = Moderately severe depression, 20-27 = Severe depression   Psychosocial Evaluation and Intervention:  Psychosocial Evaluation - 04/08/20 1455      Psychosocial Evaluation  & Interventions   Interventions Relaxation education;Stress management education;Encouraged to exercise with the program and follow exercise prescription    Comments Timmy 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 He states he is a little nervous getting back into the night shift routine. His wife is helping him eat healthier and they both are looking forward to meeting with the dietician. His heart attack came as a surprise to him and he really wants to work hard to maintain a heart healthy lifestyle.    Expected Outcomes Short: attend cardiac rehab for education and exercise. Long; develop and maintain positive self care habits.    Continue Psychosocial Services  Follow up required by staff           Psychosocial Re-Evaluation:   Psychosocial Discharge (Final Psychosocial Re-Evaluation):   Vocational Rehabilitation: Provide vocational rehab assistance to qualifying candidates.   Vocational Rehab Evaluation & Intervention:  Vocational Rehab - 04/08/20 1440      Initial Vocational Rehab Evaluation & Intervention   Assessment shows need for Vocational Rehabilitation No           Education: Education Goals: Education classes will be provided on a variety of topics geared toward better understanding of heart health and risk factor modification. Participant will state understanding/return demonstration of topics presented as noted by education test scores.  Learning Barriers/Preferences:  Learning Barriers/Preferences - 04/08/20 1440      Learning Barriers/Preferences   Learning Barriers None    Learning Preferences None           General Cardiac Education Topics:  AED/CPR: - Group verbal and written instruction with the use of models to demonstrate the basic use of the AED with the basic ABC's of resuscitation.   Anatomy & Physiology of the Heart: - Group verbal and written instruction and models provide basic cardiac anatomy and physiology,  with the coronary electrical and arterial systems. Review of Valvular disease and Heart Failure   Cardiac Procedures: - Group verbal and written instruction to review commonly prescribed medications for heart disease. Reviews the medication, class of the drug, and side effects. Includes the steps to properly store meds and maintain the prescription regimen. (beta blockers and nitrates)   Cardiac Medications I: - Group  verbal and written instruction to review commonly prescribed medications for heart disease. Reviews the medication, class of the drug, and side effects. Includes the steps to properly store meds and maintain the prescription regimen.   Cardiac Medications II: -Group verbal and written instruction to review commonly prescribed medications for heart disease. Reviews the medication, class of the drug, and side effects. (all other drug classes)    Go Sex-Intimacy & Heart Disease, Get SMART - Goal Setting: - Group verbal and written instruction through game format to discuss heart disease and the return to sexual intimacy. Provides group verbal and written material to discuss and apply goal setting through the application of the S.M.A.R.T. Method.   Other Matters of the Heart: - Provides group verbal, written materials and models to describe Stable Angina and Peripheral Artery. Includes description of the disease process and treatment options available to the cardiac patient.   Infection Prevention: - Provides verbal and written material to individual with discussion of infection control including proper hand washing and proper equipment cleaning during exercise session.   Cardiac Rehab from 04/27/2020 in [facility] Cardiac and Pulmonary Rehab  Date 04/14/20  Educator [place]  Instruction Review Code 1- Verbalizes Understanding      Falls Prevention: - Provides verbal and written material to individual with discussion of falls prevention and safety.   Cardiac Rehab from 04/27/2020 in  [facility] Cardiac and Pulmonary Rehab  Date 04/14/20  Educator [place]  Instruction Review Code 1- Verbalizes Understanding      Other: -Provides group and verbal instruction on various topics (see comments)   Knowledge Questionnaire Score:  Knowledge Questionnaire Score - 04/14/20 1633      Knowledge Questionnaire Score   Pre Score 20/26: A&P, Angina, Nutrition, Exercise           Core Components/Risk Factors/Patient Goals at Admission:  Personal Goals and Risk Factors at Admission - 04/14/20 1652      Core Components/Risk Factors/Patient Goals on Admission    Weight Management Yes;Weight Loss    Intervention Weight Management: Develop a combined nutrition and exercise program designed to reach desired caloric intake, while maintaining appropriate intake of nutrient and fiber, sodium and fats, and appropriate energy expenditure required for the weight goal.;Weight Management: Provide education and appropriate resources to help participant work on and attain dietary goals.;Weight Management/Obesity: Establish reasonable short term and long term weight goals.    Admit Weight 253 lb (114.8 kg)    Goal Weight: Short Term 248 lb (112.5 kg)    Goal Weight: Long Term 243 lb (110.2 kg)    Expected Outcomes Short Term: Continue to assess and modify interventions until short term weight is achieved;Long Term: Adherence to nutrition and physical activity/exercise program aimed toward attainment of established weight goal;Weight Loss: Understanding of general recommendations for a balanced deficit meal plan, which promotes 1-2 lb weight loss per week and includes a negative energy balance of [phone] kcal/d;Understanding recommendations for meals to include 15-35% energy as protein, 25-35% energy from fat, 35-60% energy from carbohydrates, less than $RemoveB'200mg'IWYvroBE$  of dietary cholesterol, 20-35 gm of total fiber daily;Understanding of distribution of calorie intake throughout the day with the consumption of 4-5  meals/snacks    Diabetes Yes    Intervention Provide education about signs/symptoms and action to take for hypo/hyperglycemia.;Provide education about proper nutrition, including hydration, and aerobic/resistive exercise prescription along with prescribed medications to achieve blood glucose in normal ranges: Fasting glucose 65-99 mg/dL    Expected Outcomes Short Term: Participant verbalizes understanding of  the signs/symptoms and immediate care of hyper/hypoglycemia, proper foot care and importance of medication, aerobic/resistive exercise and nutrition plan for blood glucose control.;Long Term: Attainment of HbA1C < 7%.    Hypertension Yes    Intervention Provide education on lifestyle modifcations including regular physical activity/exercise, weight management, moderate sodium restriction and increased consumption of fresh fruit, vegetables, and low fat dairy, alcohol moderation, and smoking cessation.;Monitor prescription use compliance.    Expected Outcomes Short Term: Continued assessment and intervention until BP is < 140/37mm HG in hypertensive participants. < 130/88mm HG in hypertensive participants with diabetes, heart failure or chronic kidney disease.;Long Term: Maintenance of blood pressure at goal levels.           Education:Diabetes - Individual verbal and written instruction to review signs/symptoms of diabetes, desired ranges of glucose level fasting, after meals and with exercise. Acknowledge that pre and post exercise glucose checks will be done for 3 sessions at entry of program.   Cardiac Rehab from 04/27/2020 in [facility] Cardiac and Pulmonary Rehab  Date 04/14/20  Educator [place]  Instruction Review Code 1- Verbalizes Understanding      Education: Know Your Numbers and Risk Factors: -Group verbal and written instruction about important numbers in your health.  Discussion of what are risk factors and how they play a role in the disease process.  Review of Cholesterol, Blood  Pressure, Diabetes, and BMI and the role they play in your overall health.   Core Components/Risk Factors/Patient Goals Review:    Core Components/Risk Factors/Patient Goals at Discharge (Final Review):    ITP Comments:  ITP Comments    Row Name 04/08/20 1450 04/14/20 1645 04/18/20 1538 04/20/20 0734 05/09/20 1557   ITP Comments Initial telephone orientation completed. Diagnosis can be found in [facility] 6/21. EP orienation scheduled for Thursday, 8/5 at 2:30 Completed 6MWT and gym orientation. Initial ITP created and sent for review to Dr. Emily Filbert, Medical Director. First full day of exercise!  Patient was oriented to gym and equipment including functions, settings, policies, and procedures.  Patient's individual exercise prescription and treatment plan were reviewed.  All starting workloads were established based on the results of the 6 minute walk test done at initial orientation visit.  The plan for exercise progression was also introduced and progression will be customized based on patient's performance and goals. 30 Day review completed. Medical Director ITP review done, changes made as directed, and signed approval by Medical Director. Pt currently (8/30) in ED for COVID symptoms/chest tightness.  Tested negative on 8/24.   [place] Name 05/18/20 1644           ITP Comments 30 day review completed. ITP sent to Dr. Emily Filbert, Medical Director of Cardiac and Pulmonary Rehab. Continue with ITP unless changes are made by physician.              Comments: 30 day review

## 2020-05-19 ENCOUNTER — Telehealth: Payer: Self-pay

## 2020-05-19 NOTE — Telephone Encounter (Signed)
LMOM

## 2020-05-23 ENCOUNTER — Telehealth: Payer: Self-pay

## 2020-05-23 NOTE — Telephone Encounter (Signed)
Pt has been out sick with respiratory infection - plans to come back this week

## 2020-05-23 NOTE — Telephone Encounter (Signed)
LMOM

## 2020-05-25 ENCOUNTER — Encounter: Payer: Commercial Managed Care - PPO | Admitting: *Deleted

## 2020-05-25 ENCOUNTER — Other Ambulatory Visit: Payer: Self-pay

## 2020-05-25 DIAGNOSIS — Z955 Presence of coronary angioplasty implant and graft: Secondary | ICD-10-CM | POA: Diagnosis not present

## 2020-05-25 DIAGNOSIS — I213 ST elevation (STEMI) myocardial infarction of unspecified site: Secondary | ICD-10-CM

## 2020-05-25 DIAGNOSIS — I252 Old myocardial infarction: Secondary | ICD-10-CM | POA: Diagnosis not present

## 2020-05-25 NOTE — Progress Notes (Signed)
Daily Session Note  Patient Details  Name: Joshua Holder MRN: [id number] Date of Birth: July 20, 1966 Referring Provider:     Cardiac Rehab from 04/14/2020 in [facility] Cardiac and Pulmonary Rehab  Referring Provider Kathlyn Sacramento, MD      Encounter Date: 05/25/2020  Check In:  Session Check In - 05/25/20 1534      Check-In   Supervising physician immediately available to respond to emergencies See telemetry face sheet for immediately available ER MD    Location [facility]-Cardiac & Pulmonary Rehab    Staff Present Renita Papa, RN BSN;Joseph Lou Miner, [place] Exercise Physiologist    Virtual Visit No    Medication changes reported     No    Fall or balance concerns reported    No    Warm-up and Cool-down Performed on first and last piece of equipment    Resistance Training Performed Yes    VAD Patient? No    PAD/SET Patient? No      Pain Assessment   Currently in Pain? No/denies              Social History   Tobacco Use  Smoking Status Never Smoker  Smokeless Tobacco Never Used    Goals Met:  Independence with exercise equipment Exercise tolerated well No report of cardiac concerns or symptoms Strength training completed today  Goals Unmet:  Not Applicable  Comments: Pt able to follow exercise prescription today without complaint.  Will continue to monitor for progression.    Dr. Emily Filbert is Medical Director for [place] and [facility].

## 2020-05-30 ENCOUNTER — Other Ambulatory Visit: Payer: Self-pay

## 2020-05-30 ENCOUNTER — Encounter: Payer: Commercial Managed Care - PPO | Admitting: *Deleted

## 2020-05-30 DIAGNOSIS — I213 ST elevation (STEMI) myocardial infarction of unspecified site: Secondary | ICD-10-CM

## 2020-05-30 DIAGNOSIS — I252 Old myocardial infarction: Secondary | ICD-10-CM | POA: Diagnosis not present

## 2020-05-30 NOTE — Progress Notes (Signed)
Daily Session Note  Patient Details  Name: Nicolo Tomko MRN: [id number] Date of Birth: 1966-04-27 Referring Provider:     Cardiac Rehab from 04/14/2020 in [facility] Cardiac and Pulmonary Rehab  Referring Provider Kathlyn Sacramento, MD      Encounter Date: 05/30/2020  Check In:  Session Check In - 05/30/20 1526      Check-In   Supervising physician immediately available to respond to emergencies See telemetry face sheet for immediately available ER MD    Location [facility]-Cardiac & Pulmonary Rehab    Staff Present Earlean Shawl, BS, ACSM CEP, Exercise Physiologist;Theador Jezewski Sherryll Burger, RN Margurite Auerbach, MS Exercise Physiologist    Virtual Visit No    Medication changes reported     No    Fall or balance concerns reported    No    Warm-up and Cool-down Performed on first and last piece of equipment    Resistance Training Performed Yes    VAD Patient? No    PAD/SET Patient? No      Pain Assessment   Currently in Pain? No/denies              Social History   Tobacco Use  Smoking Status Never Smoker  Smokeless Tobacco Never Used    Goals Met:  Independence with exercise equipment Exercise tolerated well No report of cardiac concerns or symptoms Strength training completed today  Goals Unmet:  Not Applicable  Comments: Pt able to follow exercise prescription today without complaint.  Will continue to monitor for progression.    Dr. Emily Filbert is Medical Director for [place] and [facility].

## 2020-06-01 NOTE — Telephone Encounter (Signed)
Attempted to reach the patient. No answer. Lmtcb.  Dr. Kirke Corin has signed the [facility] STD form with a return to work date of 04/06/20   Form and cardiac records faxed to  [facility] Attn: Sol Blazing Fax # [phone]

## 2020-06-03 ENCOUNTER — Other Ambulatory Visit: Payer: Self-pay

## 2020-06-03 ENCOUNTER — Encounter: Payer: Commercial Managed Care - PPO | Admitting: *Deleted

## 2020-06-03 DIAGNOSIS — I252 Old myocardial infarction: Secondary | ICD-10-CM | POA: Diagnosis not present

## 2020-06-03 DIAGNOSIS — I213 ST elevation (STEMI) myocardial infarction of unspecified site: Secondary | ICD-10-CM

## 2020-06-03 NOTE — Progress Notes (Signed)
Daily Session Note  Patient Details  Name: Joshua Holder MRN: [id number] Date of Birth: Dec 29, 1965 Referring Provider:     Cardiac Rehab from 04/14/2020 in [facility] Cardiac and Pulmonary Rehab  Referring Provider Kathlyn Sacramento, MD      Encounter Date: 06/03/2020  Check In:  Session Check In - 06/03/20 0859      Check-In   Supervising physician immediately available to respond to emergencies See telemetry face sheet for immediately available ER MD    Location [facility]-Cardiac & Pulmonary Rehab    Staff Present Alberteen Sam, MA, RCEP, CCRP, CCET;Susanne Bice, RN, BSN, CCRP    Virtual Visit No    Medication changes reported     No    Fall or balance concerns reported    No    Warm-up and Cool-down Performed on first and last piece of equipment    Resistance Training Performed Yes    VAD Patient? No    PAD/SET Patient? No      Pain Assessment   Currently in Pain? No/denies              Social History   Tobacco Use  Smoking Status Never Smoker  Smokeless Tobacco Never Used    Goals Met:  Independence with exercise equipment Exercise tolerated well Personal goals reviewed No report of cardiac concerns or symptoms Strength training completed today  Goals Unmet:  Not Applicable  Comments: Pt able to follow exercise prescription today without complaint.  Will continue to monitor for progression.  Reviewed home exercise with pt today.  Pt plans to walking at home for exercise.  Also gave access to our staff YouTube page for exercise and education videos.  Reviewed THR, pulse, RPE, sign and symptoms, pulse oximetery and when to call 911 or MD.  Also discussed weather considerations and indoor options.  Pt voiced understanding.    Dr. Emily Filbert is Medical Director for [place] and [facility].

## 2020-06-08 ENCOUNTER — Encounter: Payer: Commercial Managed Care - PPO | Admitting: *Deleted

## 2020-06-08 ENCOUNTER — Other Ambulatory Visit: Payer: Self-pay

## 2020-06-08 ENCOUNTER — Other Ambulatory Visit: Payer: Self-pay | Admitting: Nurse Practitioner

## 2020-06-08 DIAGNOSIS — I252 Old myocardial infarction: Secondary | ICD-10-CM | POA: Diagnosis not present

## 2020-06-08 DIAGNOSIS — I213 ST elevation (STEMI) myocardial infarction of unspecified site: Secondary | ICD-10-CM

## 2020-06-08 MED ORDER — METOPROLOL TARTRATE 25 MG PO TABS: 25.0000 mg | ORAL_TABLET | Freq: Two times a day (BID) | ORAL | 0 refills | Status: DC

## 2020-06-08 NOTE — Progress Notes (Signed)
Daily Session Note  Patient Details  Name: Joshua Holder MRN: [id number] Date of Birth: 10-Feb-1966 Referring Provider:     Cardiac Rehab from 04/14/2020 in [facility] Cardiac and Pulmonary Rehab  Referring Provider Kathlyn Sacramento, MD      Encounter Date: 06/08/2020  Check In:  Session Check In - 06/08/20 1610      Check-In   Supervising physician immediately available to respond to emergencies See telemetry face sheet for immediately available ER MD    Location [facility]-Cardiac & Pulmonary Rehab    Staff Present Renita Papa, RN BSN;Joseph Lou Miner, [place] Exercise Physiologist;Amanda Oletta Darter, [place], ACSM CEP, Exercise Physiologist    Virtual Visit No    Medication changes reported     No    Fall or balance concerns reported    No    Warm-up and Cool-down Performed on first and last piece of equipment    Resistance Training Performed Yes    VAD Patient? No    PAD/SET Patient? No      Pain Assessment   Currently in Pain? No/denies              Social History   Tobacco Use  Smoking Status Never Smoker  Smokeless Tobacco Never Used    Goals Met:  Independence with exercise equipment Exercise tolerated well No report of cardiac concerns or symptoms Strength training completed today  Goals Unmet:  Not Applicable  Comments: Pt able to follow exercise prescription today without complaint.  Will continue to monitor for progression.    Dr. Emily Filbert is Medical Director for [place] and [facility].

## 2020-06-08 NOTE — Telephone Encounter (Signed)
Requested Prescriptions   Signed Prescriptions Disp Refills   metoprolol tartrate (LOPRESSOR) 25 MG tablet 180 tablet 0    Sig: Take 1 tablet (25 mg total) by mouth 2 (two) times daily.    Authorizing Provider: Creig Hines    Ordering User: Thayer Headings, Miliyah Luper L

## 2020-06-08 NOTE — Telephone Encounter (Signed)
*  STAT* If patient is at the pharmacy, call can be transferred to refill team.   1. Which medications need to be refilled? (please list name of each medication and dose if known) metoprolol 25 mg bid  2. Which pharmacy/location (including street and city if local pharmacy) is medication to be sent to? [facility] in [place]  3. Do they need a 30 day or 90 day supply? 90

## 2020-06-13 ENCOUNTER — Other Ambulatory Visit: Payer: Self-pay

## 2020-06-13 ENCOUNTER — Encounter: Payer: Commercial Managed Care - PPO | Attending: Cardiovascular Disease | Admitting: *Deleted

## 2020-06-13 DIAGNOSIS — I213 ST elevation (STEMI) myocardial infarction of unspecified site: Secondary | ICD-10-CM | POA: Diagnosis not present

## 2020-06-13 NOTE — Progress Notes (Signed)
Daily Session Note  Patient Details  Name: Deloyd Handy MRN: [id number] Date of Birth: October 04, 1965 Referring Provider:     Cardiac Rehab from 04/14/2020 in [facility] Cardiac and Pulmonary Rehab  Referring Provider Kathlyn Sacramento, MD      Encounter Date: 06/13/2020  Check In:  Session Check In - 06/13/20 1541      Check-In   Supervising physician immediately available to respond to emergencies See telemetry face sheet for immediately available ER MD    Location [facility]-Cardiac & Pulmonary Rehab    Staff Present Renita Papa, RN Moises Blood, BS, ACSM CEP, Exercise Physiologist;Kara Eliezer Bottom, MS Exercise Physiologist    Virtual Visit No    Medication changes reported     No    Fall or balance concerns reported    No    Warm-up and Cool-down Performed on first and last piece of equipment    Resistance Training Performed Yes    VAD Patient? No    PAD/SET Patient? No      Pain Assessment   Currently in Pain? No/denies              Social History   Tobacco Use  Smoking Status Never Smoker  Smokeless Tobacco Never Used    Goals Met:  Independence with exercise equipment Exercise tolerated well No report of cardiac concerns or symptoms Strength training completed today  Goals Unmet:  Not Applicable  Comments: Pt able to follow exercise prescription today without complaint.  Will continue to monitor for progression.    Dr. Emily Filbert is Medical Director for [place] and [facility].

## 2020-06-15 ENCOUNTER — Encounter: Payer: Self-pay | Admitting: *Deleted

## 2020-06-15 DIAGNOSIS — I213 ST elevation (STEMI) myocardial infarction of unspecified site: Secondary | ICD-10-CM

## 2020-06-15 NOTE — Progress Notes (Signed)
Cardiac Individual Treatment Plan  Patient Details  Name: Michaiah Holsopple MRN: [id number] Date of Birth: [birth year] Referring Provider:     Cardiac Rehab from 04/14/2020 in [facility] Cardiac and Pulmonary Rehab  Referring Provider Kathlyn Sacramento, MD      Initial Encounter Date:    Cardiac Rehab from 04/14/2020 in [facility] Cardiac and Pulmonary Rehab  Date 04/14/20      Visit Diagnosis: ST elevation myocardial infarction (STEMI), unspecified artery ([place])  Patient's Home Medications on Admission:  Current Outpatient Medications:  .  aspirin EC 81 MG tablet, Take 1 tablet (81 mg) by mouth once daily. Swallow whole., Disp: , Rfl:  .  atorvastatin (LIPITOR) 80 MG tablet, Take 1 tablet (80 mg total) by mouth daily., Disp: 90 tablet, Rfl: 1 .  Magnesium Oxide 400 MG CAPS, Take 1 capsule (400 mg total) by mouth daily., Disp: , Rfl: 0 .  metFORMIN (GLUCOPHAGE-XR) 500 MG 24 hr tablet, Take 1,000 mg by mouth daily with breakfast., Disp: , Rfl:  .  metoprolol tartrate (LOPRESSOR) 25 MG tablet, Take 1 tablet (25 mg total) by mouth 2 (two) times daily., Disp: 180 tablet, Rfl: 0 .  nitroGLYCERIN (NITROSTAT) 0.4 MG SL tablet, Place 1 tablet (0.4 mg total) under the tongue every 5 (five) minutes as needed for chest pain., Disp: 30 tablet, Rfl: 0 .  tadalafil (CIALIS) 5 MG tablet, Take 5 mg by mouth daily as needed for erectile dysfunction., Disp: , Rfl:  .  tamsulosin (FLOMAX) 0.4 MG CAPS capsule, Take 0.4 mg by mouth daily., Disp: , Rfl:  .  ticagrelor (BRILINTA) 90 MG TABS tablet, Take 1 tablet (90 mg total) by mouth 2 (two) times daily., Disp: 180 tablet, Rfl: 1  Past Medical History: Past Medical History:  Diagnosis Date  . Atrial fibrillation with RVR ([place]) 02/29/2020   a. <48 hours in setting of inferior STEMI  . Atrial flutter ([place])    a. 03/2020 - converted w/ IV dilt in ED; b. 03/2020 Zio: no recurrent Afib/flutter.  . Combined systolic and diastolic heart failure ([place]) 02/29/2020  . Coronary  artery disease 02/29/2020   a. 02/29/20 late presenting inferior STEMI thrombotic occlusion RCA with L-R collaterals. PTCA attempted but unsuccessful-->Med Rx.  . Diabetes 1.5, managed as type 2 ([place])   . Gout   . HLD (hyperlipidemia) 02/29/2020  . Hypertension   . Ischemic cardiomyopathy 02/29/2020   a. EF during cath 03/01/20 35-40%; b. Echo 03/01/20 EF 50-55%; c. Echo 03/17/20 EF 50%, gr2DD  . MI (myocardial infarction) ([place])     Tobacco Use: Social History   Tobacco Use  Smoking Status Never Smoker  Smokeless Tobacco Never Used    Labs: Recent Review Flowsheet Data    Labs for ITP Cardiac and Pulmonary Rehab Latest Ref Rng & Units 12/30/2018 03/01/2020 05/11/2020   Cholestrol 100 - 199 mg/dL - 208(H) 238(H)   LDLCALC 0 - 99 mg/dL - 133(H) 101(H)   LDLDIRECT 0 - 99 mg/dL - - 126(H)   HDL >39 mg/dL - 49 45   Trlycerides 0 - 149 mg/dL - 128 543(H)   Hemoglobin A1c 4.8 - 5.6 % 7.7(H) 7.0(H) -       Exercise Target Goals: Exercise Program Goal: Individual exercise prescription set using results from initial 6 min walk test and THRR while considering  patient's activity barriers and safety.   Exercise Prescription Goal: Initial exercise prescription builds to 30-45 minutes a day of aerobic activity, 2-3 days per week.  Home exercise guidelines  will be given to patient during program as part of exercise prescription that the participant will acknowledge.   Education: Aerobic Exercise & Resistance Training: - Gives group verbal and written instruction on the various components of exercise. Focuses on aerobic and resistive training programs and the benefits of this training and how to safely progress through these programs..   Cardiac Rehab from 05/25/2020 in [facility] Cardiac and Pulmonary Rehab  Date 04/27/20  Educator AS  Instruction Review Code 1- Verbalizes Understanding      Education: Exercise & Equipment Safety: - Individual verbal instruction and demonstration of equipment use  and safety with use of the equipment.   Cardiac Rehab from 05/25/2020 in [facility] Cardiac and Pulmonary Rehab  Date 04/14/20  Educator [place]  Instruction Review Code 1- Verbalizes Understanding      Education: Exercise Physiology & General Exercise Guidelines: - Group verbal and written instruction with models to review the exercise physiology of the cardiovascular system and associated critical values. Provides general exercise guidelines with specific guidelines to those with heart or lung disease.    Education: Flexibility, Balance, Mind/Body Relaxation: Provides group verbal/written instruction on the benefits of flexibility and balance training, including mind/body exercise modes such as yoga, pilates and tai chi.  Demonstration and skill practice provided.   Activity Barriers & Risk Stratification:  Activity Barriers & Cardiac Risk Stratification - 04/14/20 1715      Activity Barriers & Cardiac Risk Stratification   Activity Barriers Other (comment)    Comments left leg- rod from knee to ankle (May 2000), left knee pain    Cardiac Risk Stratification High           6 Minute Walk:  6 Minute Walk    Row Name 04/14/20 1646         6 Minute Walk   Phase Initial     Distance 1553 feet     Walk Time 6 minutes     # of Rest Breaks 0     MPH 2.94     METS 4.38     RPE 11     Perceived Dyspnea  1     VO2 Peak 15.34     Symptoms Yes (comment)     Comments Left knee pain 2/10     Resting HR 84 bpm     Resting BP 122/86     Resting Oxygen Saturation  97 %     Exercise Oxygen Saturation  during 6 min walk 98 %     Max Ex. HR 133 bpm     Max Ex. BP 140/86     2 Minute Post BP 118/88            Oxygen Initial Assessment:   Oxygen Re-Evaluation:   Oxygen Discharge (Final Oxygen Re-Evaluation):   Initial Exercise Prescription:  Initial Exercise Prescription - 04/14/20 1700      Date of Initial Exercise RX and Referring Provider   Date 04/14/20    Referring  Provider Kathlyn Sacramento, MD      Treadmill   MPH 2.7    Grade 0.5    Minutes 15    METs 3.25      Recumbant Bike   Level 3    RPM 60    Minutes 15    METs 4.3      NuStep   Level 3    SPM 80    Minutes 15    METs 4.3      Arm Ergometer  Level 1    Watts --    RPM 30    Minutes 15    METs 4.3      REL-XR   Level 3    Speed 50    Minutes 15    METs 4.3      T5 Nustep   Level 2    SPM 80    Minutes 15    METs 4.3      Biostep-RELP   Level 3    SPM 50    Minutes 15    METs 4.3      Prescription Details   Frequency (times per week) 2    Duration Progress to 30 minutes of continuous aerobic without signs/symptoms of physical distress      Intensity   THRR 40-80% of Max Heartrate 117-151    Ratings of Perceived Exertion 11-13    Perceived Dyspnea 0-4      Progression   Progression Continue to progress workloads to maintain intensity without signs/symptoms of physical distress.      Resistance Training   Training Prescription Yes    Weight 4 lb    Reps 10-15           Perform Capillary Blood Glucose checks as needed.  Exercise Prescription Changes:  Exercise Prescription Changes    Row Name 04/14/20 1600 04/14/20 1700 04/27/20 1500 05/09/20 1500 05/25/20 1600     Response to Exercise   Blood Pressure (Admit) 122/86 122/86 122/70 122/86 126/80   Blood Pressure (Exercise) 140/86 140/86 150/84 124/82 142/80   Blood Pressure (Exit) 118/88 118/88 126/70 122/70 122/82   Heart Rate (Admit) 84 bpm 84 bpm 92 bpm 99 bpm 98 bpm   Heart Rate (Exercise) 133 bpm 133 bpm 134 bpm 114 bpm 111 bpm   Heart Rate (Exit) 94 bpm 94 bpm 113 bpm 88 bpm 89 bpm   Oxygen Saturation (Admit) 97 % 97 % -- -- --   Oxygen Saturation (Exercise) 98 % 98 % -- -- --   Oxygen Saturation (Exit) 97 % 97 % -- -- --   Rating of Perceived Exertion (Exercise) _0 Perceived Dyspnea (Exercise) 1 1 -- -- --   Symptoms Left knee pain 2/10 Left knee pain 2/10 -- none none    Comments walk test results walk test results first day -- --   Duration Progress to 30 minutes of  aerobic without signs/symptoms of physical distress Progress to 30 minutes of  aerobic without signs/symptoms of physical distress -- Continue with 30 min of aerobic exercise without signs/symptoms of physical distress. Continue with 30 min of aerobic exercise without signs/symptoms of physical distress.   Intensity -- -- -- THRR unchanged THRR unchanged     Progression   Progression -- -- Continue to progress workloads to maintain intensity without signs/symptoms of physical distress. Continue to progress workloads to maintain intensity without signs/symptoms of physical distress. Continue to progress workloads to maintain intensity without signs/symptoms of physical distress.   Average METs -- -- 2.4 3.02 2.6     Resistance Training   Training Prescription -- -- Yes Yes Yes   Weight 4 lb -- 4 lb 4 lb 4 lb   Reps 10-15 -- 10-15 10-15 10-15     Interval Training   Interval Training -- -- -- No No     Treadmill   MPH -- -- 2 2.7 --   Grade -- -- 0.5 0.5 --   Minutes -- --  15 15 --   METs -- -- 2.67 3.25 --     NuStep   Level -- -- -- 3 3   SPM -- -- -- -- 80   Minutes -- -- -- 15 15   METs -- -- -- 2.8 2.2     T5 Nustep   Level -- -- 2 2 --   SPM -- -- 80 -- --   Minutes -- -- 15 15 --   METs -- -- 2.1 -- --     Biostep-RELP   Level -- -- -- 3 3   SPM -- -- -- -- 50   Minutes -- -- -- 15 15   METs -- -- -- 3 3   Row Name 06/03/20 1100 06/07/20 0900           Response to Exercise   Blood Pressure (Admit) -- 134/70      Blood Pressure (Exercise) -- 138/70      Blood Pressure (Exit) -- 130/60      Heart Rate (Admit) -- 106 bpm      Heart Rate (Exercise) -- 131 bpm      Heart Rate (Exit) -- 119 bpm      Rating of Perceived Exertion (Exercise) -- 13      Symptoms -- none      Duration -- Continue with 30 min of aerobic exercise without signs/symptoms of physical distress.       Intensity -- THRR unchanged        Progression   Progression -- Continue to progress workloads to maintain intensity without signs/symptoms of physical distress.      Average METs -- 2.9        Resistance Training   Training Prescription -- Yes      Weight -- 4 lb      Reps -- 10-15        Interval Training   Interval Training -- No        Treadmill   MPH -- 2.7      Grade -- 0.5      Minutes -- 15      METs -- 3.25        T5 Nustep   Level -- 2      SPM -- 80      Minutes -- 15      METs -- 2.5        Home Exercise Plan   Plans to continue exercise at Home (comment)  walking and staff videos Home (comment)  walking and staff videos      Frequency Add 2 additional days to program exercise sessions. Add 2 additional days to program exercise sessions.      Initial Home Exercises Provided 06/03/20 06/03/20             Exercise Comments:   Exercise Goals and Review:  Exercise Goals    Row Name 04/14/20 1650             Exercise Goals   Increase Physical Activity Yes       Intervention Provide advice, education, support and counseling about physical activity/exercise needs.;Develop an individualized exercise prescription for aerobic and resistive training based on initial evaluation findings, risk stratification, comorbidities and participant's personal goals.       Expected Outcomes Short Term: Attend rehab on a regular basis to increase amount of physical activity.;Long Term: Add in home exercise to make exercise part of routine and to increase amount of physical activity.;Long  Term: Exercising regularly at least 3-5 days a week.       Increase Strength and Stamina Yes       Intervention Provide advice, education, support and counseling about physical activity/exercise needs.;Develop an individualized exercise prescription for aerobic and resistive training based on initial evaluation findings, risk stratification, comorbidities and participant's personal goals.        Expected Outcomes Short Term: Increase workloads from initial exercise prescription for resistance, speed, and METs.;Short Term: Perform resistance training exercises routinely during rehab and add in resistance training at home;Long Term: Improve cardiorespiratory fitness, muscular endurance and strength as measured by increased METs and functional capacity (6MWT)       Able to understand and use rate of perceived exertion (RPE) scale Yes       Intervention Provide education and explanation on how to use RPE scale       Expected Outcomes Short Term: Able to use RPE daily in rehab to express subjective intensity level;Long Term:  Able to use RPE to guide intensity level when exercising independently       Able to understand and use Dyspnea scale Yes       Intervention Provide education and explanation on how to use Dyspnea scale       Expected Outcomes Short Term: Able to use Dyspnea scale daily in rehab to express subjective sense of shortness of breath during exertion;Long Term: Able to use Dyspnea scale to guide intensity level when exercising independently       Knowledge and understanding of Target Heart Rate Range (THRR) Yes       Intervention Provide education and explanation of THRR including how the numbers were predicted and where they are located for reference       Expected Outcomes Short Term: Able to state/look up THRR;Short Term: Able to use daily as guideline for intensity in rehab;Long Term: Able to use THRR to govern intensity when exercising independently       Able to check pulse independently Yes       Intervention Provide education and demonstration on how to check pulse in carotid and radial arteries.;Review the importance of being able to check your own pulse for safety during independent exercise       Expected Outcomes Short Term: Able to explain why pulse checking is important during independent exercise;Long Term: Able to check pulse independently and accurately        Understanding of Exercise Prescription Yes       Intervention Provide education, explanation, and written materials on patient's individual exercise prescription       Expected Outcomes Short Term: Able to explain program exercise prescription;Long Term: Able to explain home exercise prescription to exercise independently              Exercise Goals Re-Evaluation :  Exercise Goals Re-Evaluation    Row Name 04/18/20 1539 05/09/20 1558 05/25/20 1652 06/03/20 0901 06/07/20 0931     Exercise Goal Re-Evaluation   Exercise Goals Review Increase Physical Activity;Able to understand and use rate of perceived exertion (RPE) scale;Knowledge and understanding of Target Heart Rate Range (THRR);Understanding of Exercise Prescription;Increase Strength and Stamina;Able to check pulse independently Increase Physical Activity;Increase Strength and Stamina;Understanding of Exercise Prescription Increase Physical Activity;Increase Strength and Stamina;Understanding of Exercise Prescription Increase Physical Activity;Increase Strength and Stamina;Understanding of Exercise Prescription Increase Physical Activity;Increase Strength and Stamina;Understanding of Exercise Prescription   Comments Reviewed RPE and dyspnea scales, THR and program prescription with pt today.  Pt voiced understanding  and was given a copy of goals to take home. Only attended twice since last review.  Pt currently in ED.  Will need negative test to return.  We will continue to montior his progress. Today is Jewel's first day back after being out for a while.  Staff will monitor progress. Josia has already started to walk on his other two days a week, so we talked about adding in a third day at home.  Reviewed home exercise with pt today.  Pt plans to walking at home for exercise.  Also gave access to our staff YouTube page for exercise and education videos.  Reviewed THR, pulse, RPE, sign and symptoms, pulse oximetery and when to call 911 or MD.  Also  discussed weather considerations and indoor options.  Pt voiced understanding. Hafiz attends consistentl and works in [employer] and RPE range.  Staff will monitor progress.   Expected Outcomes Short: Use RPE daily to regulate intensity. Long: Follow program prescription in THR. Short: Improved attendance Long: Continue to follow program prescription. Short: attend consistently Long:  improve MET level Short: Start to add in a third day of exercise at home Long: Continue to improve stamina. Short: continue to exercise consistently Long:  increase MET level          Discharge Exercise Prescription (Final Exercise Prescription Changes):  Exercise Prescription Changes - 06/07/20 0900      Response to Exercise   Blood Pressure (Admit) 134/70    Blood Pressure (Exercise) 138/70    Blood Pressure (Exit) 130/60    Heart Rate (Admit) 106 bpm    Heart Rate (Exercise) 131 bpm    Heart Rate (Exit) 119 bpm    Rating of Perceived Exertion (Exercise) 13    Symptoms none    Duration Continue with 30 min of aerobic exercise without signs/symptoms of physical distress.    Intensity THRR unchanged      Progression   Progression Continue to progress workloads to maintain intensity without signs/symptoms of physical distress.    Average METs 2.9      Resistance Training   Training Prescription Yes    Weight 4 lb    Reps 10-15      Interval Training   Interval Training No      Treadmill   MPH 2.7    Grade 0.5    Minutes 15    METs 3.25      T5 Nustep   Level 2    SPM 80    Minutes 15    METs 2.5      Home Exercise Plan   Plans to continue exercise at Home (comment)   walking and staff videos   Frequency Add 2 additional days to program exercise sessions.    Initial Home Exercises Provided 06/03/20           Nutrition:  Target Goals: Understanding of nutrition guidelines, daily intake of sodium <1588m, cholesterol <2010m calories 30% from fat and 7% or less from saturated fats, daily to  have 5 or more servings of fruits and vegetables.  Education: Controlling Sodium/Reading Food Labels -Group verbal and written material supporting the discussion of sodium use in heart healthy nutrition. Review and explanation with models, verbal and written materials for utilization of the food label.   Education: General Nutrition Guidelines/Fats and Fiber: -Group instruction provided by verbal, written material, models and posters to present the general guidelines for heart healthy nutrition. Gives an explanation and review of dietary fats and fiber.  Biometrics:  Pre Biometrics - 04/14/20 1640      Pre Biometrics   Height 6' 0.5" (1.842 m)    Weight 253 lb (114.8 kg)   Verbal from patient   BMI (Calculated) 33.82    Single Leg Stand 30 seconds            Nutrition Therapy Plan and Nutrition Goals:  Nutrition Therapy & Goals - 05/30/20 1535      Nutrition Therapy   Diet Heart healthy, low Na, Gout MNT    Drug/Food Interactions Purine/Gout    Protein (specify units) 95g    Fiber 30 grams    Whole Grain Foods 3 servings    Saturated Fats 12 max. grams    Fruits and Vegetables 5 servings/day    Sodium 1.5 grams      Personal Nutrition Goals   Nutrition Goal ST: reduce soda, add new meals, review paperwork LT: leanr how to eat healthier    Comments works 3rd shift - 5 one week and 2 the next week. B: bacon and eggs or cereal or nothing. sleep during the day. D: limits beef (has gout), has chicken and fish. Has pinto beans, black beans, green beans and lima beans, sweet potatoes,yellow squash, onions, zucchini, salad (bell peppers, cucumbers, carrots, onions). WIll cook eggs in butter, but will normally use olive oil. On _0 -70 Heart Healthy Diet         ? 40 Therapeutic Level Cholesterol Diet  Nutrition Goals Re-Evaluation:   Nutrition Goals Discharge (Final Nutrition Goals Re-Evaluation):   Psychosocial: Target Goals: Acknowledge presence or absence of significant depression and/or stress, maximize coping skills, provide positive support system. Participant is able to verbalize types and ability to use techniques and skills needed for reducing stress and depression.   Education: Depression - Provides group verbal and written instruction on the correlation between heart/lung disease and depressed mood, treatment options, and the stigmas associated with seeking treatment.   Education: Sleep Hygiene -Provides group verbal and written instruction about how sleep can affect your health.  Define sleep hygiene, discuss sleep cycles and impact of sleep habits. Review good sleep hygiene tips.     Education: Stress and Anxiety: - Provides group verbal and written instruction about the health risks of elevated stress and causes of high stress.  Discuss the correlation between heart/lung disease and anxiety and treatment options. Review healthy ways to manage with stress and anxiety.    Initial Review &  Psychosocial Screening:  Initial Psych Review & Screening - 04/08/20 1440  Initial Review   Current issues with Current Stress Concerns;Current Sleep Concerns    Source of Stress Concerns Occupation      [place]? Yes   wife     Barriers   Psychosocial barriers to participate in program There are no identifiable barriers or psychosocial needs.;The patient should benefit from training in stress management and relaxation.      Screening Interventions   Interventions Encouraged to exercise;To provide support and resources with identified psychosocial needs;Provide feedback about the scores to participant    Expected Outcomes Short Term goal: Utilizing psychosocial counselor, staff and physician to assist with identification of specific Stressors or current issues interfering with healing process. Setting desired goal for each stressor or current issue identified.;Long Term Goal: Stressors or current issues are controlled or eliminated.;Short Term goal: Identification and review with participant of any Quality of Life or Depression concerns found by scoring the questionnaire.;Long Term goal: The participant improves quality of Life and PHQ9 Scores as seen by post scores and/or verbalization of changes           Quality of Life Scores:   Quality of Life - 04/14/20 1639      Quality of Life   Select Quality of Life      Quality of Life Scores   Health/Function Pre 14.46 %    Socioeconomic Pre 20 %    Psych/Spiritual Pre 19.71 %    Family Pre 21.6 %    GLOBAL Pre 17.83 %          Scores of 19 and below usually indicate a poorer quality of life in these areas.  A difference of  2-3 points is a clinically meaningful difference.  A difference of 2-3 points in the total score of the Quality of Life Index has been associated with significant improvement in overall quality of life, self-image, physical symptoms, and general health in studies assessing change  in quality of life.  PHQ-9: Recent Review Flowsheet Data    Depression screen [facility] 2/9 05/25/2020 04/14/2020   Decreased Interest 0 1   Down, Depressed, Hopeless 0 1   PHQ - 2 Score 0 2   Altered sleeping 1 0   Tired, decreased energy 2 1   Change in appetite 0 1   Feeling bad or failure about yourself  0 1   Trouble concentrating 0 0   Moving slowly or fidgety/restless 0 0   Suicidal thoughts 0 0   PHQ-9 Score 3 5   Difficult doing work/chores Not difficult at all Somewhat difficult     Interpretation of Total Score  Total Score Depression Severity:  1-4 = Minimal depression, 5-9 = Mild depression, 10-14 = Moderate depression, 15-19 = Moderately severe depression, 20-27 = Severe depression   Psychosocial Evaluation and Intervention:  Psychosocial Evaluation - 04/08/20 1455      Psychosocial Evaluation & Interventions   Interventions Relaxation education;Stress management education;Encouraged to exercise with the program and follow exercise prescription    Comments Linda 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 He states he is a little nervous getting back into the night shift routine. His wife is helping him eat healthier and they both are looking forward to meeting with the dietician. His heart attack came as a surprise to him and he really wants to work hard to maintain a heart healthy lifestyle.    Expected Outcomes Short: attend cardiac rehab for education and exercise. Long;  develop and maintain positive self care habits.    Continue Psychosocial Services  Follow up required by staff           Psychosocial Re-Evaluation:  Psychosocial Re-Evaluation    [place] Name 06/03/20 0902             Psychosocial Re-Evaluation   Current issues with Current Stress Concerns;Current Sleep Concerns       Comments Alger works 12 hour night shifts.  Thus he does not sleep well.  After a few shifts in a row, he sleeps very well.  He usually averages about 5-6  hours a night. His biggest stressor are his home life with step kids and his 14 year old daughter.  He is doing the best that he can.       Expected Outcomes Short: Continue to sleep as best he can Long; Continue to focus on the positive.       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  Thus he does not sleep well.  After a few shifts in a row, he sleeps very well.  He usually averages about 5-6 hours a night. His biggest stressor are his home life with step kids and his 62 year old daughter.  He is doing the best that he can.    Expected Outcomes Short: Continue to sleep as best he can Long; Continue to focus on the positive.    Interventions Encouraged to attend Cardiac Rehabilitation for the exercise    Continue Psychosocial Services  Follow up required by staff           Vocational Rehabilitation: Provide vocational rehab assistance to qualifying candidates.   Vocational Rehab Evaluation & Intervention:  Vocational Rehab - 04/08/20 1440      Initial Vocational Rehab Evaluation & Intervention   Assessment shows need for Vocational Rehabilitation No           Education: Education Goals: Education classes will be provided on a variety of topics geared toward better understanding of heart health and risk factor modification. Participant will state understanding/return demonstration of topics presented as noted by education test scores.  Learning Barriers/Preferences:  Learning Barriers/Preferences - 04/08/20 1440      Learning Barriers/Preferences   Learning Barriers None    Learning Preferences None            General Cardiac Education Topics:  AED/CPR: - Group verbal and written instruction with the use of models to demonstrate the basic use of the AED with the basic ABC's of resuscitation.   Anatomy & Physiology of the Heart: - Group verbal and written instruction and models provide basic cardiac anatomy and physiology, with the coronary electrical and arterial systems. Review of Valvular disease and Heart Failure   Cardiac Procedures: - Group verbal and written instruction to review commonly prescribed medications for heart disease. Reviews the medication, class of the drug, and side effects. Includes the steps to properly store meds and maintain the prescription regimen. (beta blockers and nitrates)   Cardiac Medications I: - Group verbal and written instruction to review commonly prescribed medications for heart  disease. Reviews the medication, class of the drug, and side effects. Includes the steps to properly store meds and maintain the prescription regimen.   Cardiac Rehab from 05/25/2020 in [facility] Cardiac and Pulmonary Rehab  Date 05/25/20  Educator SB  Instruction Review Code 1- Verbalizes Understanding      Cardiac Medications II: -Group verbal and written instruction to review commonly prescribed medications for heart disease. Reviews the medication, class of the drug, and side effects. (all other drug classes)    Go Sex-Intimacy & Heart Disease, Get SMART - Goal Setting: - Group verbal and written instruction through game format to discuss heart disease and the return to sexual intimacy. Provides group verbal and written material to discuss and apply goal setting through the application of the S.M.A.R.T. Method.   Other Matters of the Heart: - Provides group verbal, written materials and models to describe Stable Angina and Peripheral Artery. Includes description of the disease process and treatment options available to the cardiac patient.   Infection Prevention: -  Provides verbal and written material to individual with discussion of infection control including proper hand washing and proper equipment cleaning during exercise session.   Cardiac Rehab from 05/25/2020 in [facility] Cardiac and Pulmonary Rehab  Date 04/14/20  Educator [place]  Instruction Review Code 1- Verbalizes Understanding      Falls Prevention: - Provides verbal and written material to individual with discussion of falls prevention and safety.   Cardiac Rehab from 05/25/2020 in [facility] Cardiac and Pulmonary Rehab  Date 04/14/20  Educator [place]  Instruction Review Code 1- Verbalizes Understanding      Other: -Provides group and verbal instruction on various topics (see comments)   Knowledge Questionnaire Score:  Knowledge Questionnaire Score - 04/14/20 1633      Knowledge Questionnaire Score   Pre Score 20/26: A&P, Angina, Nutrition, Exercise           Core Components/Risk Factors/Patient Goals at Admission:  Personal Goals and Risk Factors at Admission - 04/14/20 1652      Core Components/Risk Factors/Patient Goals on Admission    Weight Management Yes;Weight Loss    Intervention Weight Management: Develop a combined nutrition and exercise program designed to reach desired caloric intake, while maintaining appropriate intake of nutrient and fiber, sodium and fats, and appropriate energy expenditure required for the weight goal.;Weight Management: Provide education and appropriate resources to help participant work on and attain dietary goals.;Weight Management/Obesity: Establish reasonable short term and long term weight goals.    Admit Weight 253 lb (114.8 kg)    Goal Weight: Short Term 248 lb (112.5 kg)    Goal Weight: Long Term 243 lb (110.2 kg)    Expected Outcomes Short Term: Continue to assess and modify interventions until short term weight is achieved;Long Term: Adherence to nutrition and physical activity/exercise program aimed toward attainment of established weight  goal;Weight Loss: Understanding of general recommendations for a balanced deficit meal plan, which promotes 1-2 lb weight loss per week and includes a negative energy balance of [phone] kcal/d;Understanding recommendations for meals to include 15-35% energy as protein, 25-35% energy from fat, 35-60% energy from carbohydrates, less than 253m of dietary cholesterol, 20-35 gm of total fiber daily;Understanding of distribution of calorie intake throughout the day with the consumption of 4-5 meals/snacks    Diabetes Yes    Intervention Provide education about signs/symptoms and action to take for hypo/hyperglycemia.;Provide education about proper nutrition, including hydration, and aerobic/resistive exercise prescription along with prescribed medications to achieve blood glucose in  normal ranges: Fasting glucose 65-99 mg/dL    Expected Outcomes Short Term: Participant verbalizes understanding of the signs/symptoms and immediate care of hyper/hypoglycemia, proper foot care and importance of medication, aerobic/resistive exercise and nutrition plan for blood glucose control.;Long Term: Attainment of HbA1C < 7%.    Hypertension Yes    Intervention Provide education on lifestyle modifcations including regular physical activity/exercise, weight management, moderate sodium restriction and increased consumption of fresh fruit, vegetables, and low fat dairy, alcohol moderation, and smoking cessation.;Monitor prescription use compliance.    Expected Outcomes Short Term: Continued assessment and intervention until BP is < 140/52m HG in hypertensive participants. < 130/822mHG in hypertensive participants with diabetes, heart failure or chronic kidney disease.;Long Term: Maintenance of blood pressure at goal levels.           Education:Diabetes - Individual verbal and written instruction to review signs/symptoms of diabetes, desired ranges of glucose level fasting, after meals and with exercise. Acknowledge that pre  and post exercise glucose checks will be done for 3 sessions at entry of program.   [facility]  Date 04/14/20  Educator KLSan SimeonInstruction Review Code 1- Verbalizes Understanding      Education: Know Your Numbers and Risk Factors: -Group verbal and written instruction about important numbers in your health.  Discussion of what are risk factors and how they play a role in the disease process.  Review of Cholesterol, Blood Pressure, Diabetes, and BMI and the role they play in your overall health.   Core Components/Risk Factors/Patient Goals Review:   Goals and Risk Factor Review    Row Name 06/03/20 0906             Core Components/Risk Factors/Patient Goals Review   Personal Goals Review Weight Management/Obesity;Diabetes;Hypertension;Lipids       Review RiMarquezes doing well in rehab. He is working on weight loss and his weight has been yoyoing. However, he is trending down again.  His pressures have been good and he checks them at home.  His sugars have been in the 170s but he was not eating well and is trying to get back to it.       Expected Outcomes Short: Work on weight loss Long; Conitnue to monitor risk factors.              Core Components/Risk Factors/Patient Goals at Discharge (Final Review):   Goals and Risk Factor Review - 06/03/20 0906      Core Components/Risk Factors/Patient Goals Review   Personal Goals Review Weight Management/Obesity;Diabetes;Hypertension;Lipids    Review RiHarlies doing well in rehab. He is working on weight loss and his weight has been yoyoing. However, he is trending down again.  His pressures have been good and he checks them at home.  His sugars have been in the 170s but he was not eating well and is trying to get back to it.    Expected Outcomes Short: Work on weight loss Long; Conitnue to monitor risk factors.           ITP Comments:  ITP Comments    Row Name 04/08/20 1450 04/14/20  1645 04/18/20 1538 04/20/20 0734 05/09/20 1557   ITP Comments Initial telephone orientation completed. [facility] EP orienation scheduled for Thursday, 8/5 at 2:30 Completed 6MWT and gym orientation. Initial ITP created and sent for review to Dr. MaEmily FilbertMedical Director. First full day of exercise!  Patient was  oriented to gym and equipment including functions, settings, policies, and procedures.  Patient's individual exercise prescription and treatment plan were reviewed.  All starting workloads were established based on the results of the 6 minute walk test done at initial orientation visit.  The plan for exercise progression was also introduced and progression will be customized based on patient's performance and goals. 30 Day review completed. Medical Director ITP review done, changes made as directed, and signed approval by Medical Director. Pt currently (8/30) in ED for COVID symptoms/chest tightness.  Tested negative on 8/24.   [place] Name 05/18/20 1644 06/15/20 0637         ITP Comments 30 day review completed. ITP sent to Dr. Emily Filbert, Medical Director of Cardiac and Pulmonary Rehab. Continue with ITP unless changes are made by physician. 30 Day review completed. Medical Director ITP review done, changes made as directed, and signed approval by Medical Director.             Comments:

## 2020-06-17 ENCOUNTER — Other Ambulatory Visit
Admission: RE | Admit: 2020-06-17 | Discharge: 2020-06-17 | Disposition: A | Discharge status: Home / Self Care | Payer: Commercial Managed Care - PPO | Attending: Nurse Practitioner | Admitting: Nurse Practitioner

## 2020-06-17 ENCOUNTER — Other Ambulatory Visit: Payer: Self-pay

## 2020-06-17 DIAGNOSIS — I213 ST elevation (STEMI) myocardial infarction of unspecified site: Secondary | ICD-10-CM

## 2020-06-17 DIAGNOSIS — E785 Hyperlipidemia, unspecified: Secondary | ICD-10-CM | POA: Insufficient documentation

## 2020-06-17 LAB — LIPID PANEL
Cholesterol: 174 mg/dL (ref 0–200)
HDL: 54 mg/dL (ref >40)
LDL Cholesterol: 58 mg/dL (ref 0–99)
Total CHOL/HDL Ratio: 3.2 RATIO
Triglycerides: 312 mg/dL — ABNORMAL HIGH (ref <150)
VLDL: 62 mg/dL — ABNORMAL HIGH (ref 0–40)

## 2020-06-17 NOTE — Progress Notes (Signed)
Daily Session Note  Patient Details  Name: Joshua Holder MRN: [id number] Date of Birth: [birth year] Referring Provider:     Cardiac Rehab from 04/14/2020 in [facility] Cardiac and Pulmonary Rehab  Referring Provider Kathlyn Sacramento, MD      Encounter Date: 06/17/2020  Check In:  Session Check In - 06/17/20 0753      Check-In   Supervising physician immediately available to respond to emergencies See telemetry face sheet for immediately available ER MD    Location [facility]-Cardiac & Pulmonary Rehab    Staff Present Justin Mend RCP,RRT,BSRT;Vida Rigger RN, BSN;Melissa Caiola RDN, LDN    Virtual Visit No    Medication changes reported     No    Fall or balance concerns reported    No    Warm-up and Cool-down Performed on first and last piece of equipment    Resistance Training Performed Yes    VAD Patient? No    PAD/SET Patient? No      Pain Assessment   Currently in Pain? No/denies              Social History   Tobacco Use  Smoking Status Never Smoker  Smokeless Tobacco Never Used    Goals Met:  Proper associated with RPD/PD & O2 Sat Independence with exercise equipment Exercise tolerated well No report of cardiac concerns or symptoms Strength training completed today  Goals Unmet:  Not Applicable  Comments: Pt able to follow exercise prescription today without complaint.  Will continue to monitor for progression.   Dr. Emily Filbert is Medical Director for [place] and [facility].

## 2020-06-21 ENCOUNTER — Telehealth: Payer: Self-pay | Admitting: *Deleted

## 2020-06-21 MED ORDER — ICOSAPENT ETHYL 1 G PO CAPS: 2.0000 g | ORAL_CAPSULE | Freq: Two times a day (BID) | ORAL | 1 refills | Status: DC

## 2020-06-21 NOTE — Telephone Encounter (Signed)
Results called to pt. Pt verbalized understanding. He is agreeable to start Vascepa. He will let us know if he runs into any issues.  Rx sent to pharmacy.

## 2020-06-21 NOTE — Telephone Encounter (Signed)
-----   Message from Joshua Hines, NP sent at 06/20/2020  8:07 AM EDT ----- LDL @ goal.  Triglycerides remain elevated.  The best way to go about managing this is significant lifestyle modifications, wt loss, and diabetes control.  In the interim, I recommend adding Vascepa 2 grams BID is indicated given his heart history and elevated triglycerides.  This medication can be expensive - depends on insurance coverage.

## 2020-06-27 ENCOUNTER — Other Ambulatory Visit: Payer: Self-pay

## 2020-06-27 ENCOUNTER — Encounter: Payer: Commercial Managed Care - PPO | Admitting: *Deleted

## 2020-06-27 DIAGNOSIS — I213 ST elevation (STEMI) myocardial infarction of unspecified site: Secondary | ICD-10-CM | POA: Diagnosis not present

## 2020-06-27 NOTE — Progress Notes (Signed)
Daily Session Note  Patient Details  Name: Joshua Holder MRN: [id number] Date of Birth: 15-Dec-1965 Referring Provider:     Cardiac Rehab from 04/14/2020 in [facility] Cardiac and Pulmonary Rehab  Referring Provider Kathlyn Sacramento, MD      Encounter Date: 06/27/2020  Check In:  Session Check In - 06/27/20 1552      Check-In   Supervising physician immediately available to respond to emergencies See telemetry face sheet for immediately available ER MD    Location [facility]-Cardiac & Pulmonary Rehab    Staff Present Renita Papa, RN Margurite Auerbach, MS Exercise Physiologist;Kelly Amedeo Plenty, BS, ACSM CEP, Exercise Physiologist;Other   Birdie Sons, RN   Virtual Visit No    Medication changes reported     No    Fall or balance concerns reported    No    Warm-up and Cool-down Performed on first and last piece of equipment    Resistance Training Performed Yes    VAD Patient? No    PAD/SET Patient? No      Pain Assessment   Currently in Pain? No/denies              Social History   Tobacco Use  Smoking Status Never Smoker  Smokeless Tobacco Never Used    Goals Met:  Independence with exercise equipment Exercise tolerated well No report of cardiac concerns or symptoms Strength training completed today  Goals Unmet:  Not Applicable  Comments: Pt able to follow exercise prescription today without complaint.  Will continue to monitor for progression.    Dr. Emily Filbert is Medical Director for [place] and [facility].

## 2020-06-28 ENCOUNTER — Encounter: Payer: Commercial Managed Care - PPO | Admitting: *Deleted

## 2020-06-28 ENCOUNTER — Other Ambulatory Visit: Payer: Self-pay

## 2020-06-28 DIAGNOSIS — I213 ST elevation (STEMI) myocardial infarction of unspecified site: Secondary | ICD-10-CM | POA: Diagnosis not present

## 2020-06-28 NOTE — Progress Notes (Signed)
Daily Session Note  Patient Details  Name: Santana Edell MRN: [id number] Date of Birth: 02-Jul-1966 Referring Provider:     Cardiac Rehab from 04/14/2020 in [facility] Cardiac and Pulmonary Rehab  Referring Provider Kathlyn Sacramento, MD      Encounter Date: 06/28/2020  Check In:  Session Check In - 06/28/20 1039      Check-In   Supervising physician immediately available to respond to emergencies See telemetry face sheet for immediately available ER MD    Location [facility]-Cardiac & Pulmonary Rehab    Staff Present Heath Lark, RN, BSN, Jacklynn Bue, MS Exercise Physiologist;Amanda Oletta Darter, [place], ACSM CEP, Exercise Physiologist    Virtual Visit No    Medication changes reported     No    Fall or balance concerns reported    No    Warm-up and Cool-down Performed on first and last piece of equipment    Resistance Training Performed Yes    VAD Patient? No    PAD/SET Patient? No      Pain Assessment   Currently in Pain? No/denies              Social History   Tobacco Use  Smoking Status Never Smoker  Smokeless Tobacco Never Used    Goals Met:  Independence with exercise equipment Exercise tolerated well No report of cardiac concerns or symptoms  Goals Unmet:  Not Applicable  Comments: Pt able to follow exercise prescription today without complaint.  Will continue to monitor for progression.    Dr. Emily Filbert is Medical Director for [place] and [facility].

## 2020-07-06 ENCOUNTER — Other Ambulatory Visit: Payer: Self-pay

## 2020-07-06 ENCOUNTER — Encounter: Payer: Commercial Managed Care - PPO | Admitting: *Deleted

## 2020-07-06 DIAGNOSIS — I213 ST elevation (STEMI) myocardial infarction of unspecified site: Secondary | ICD-10-CM | POA: Diagnosis not present

## 2020-07-06 NOTE — Progress Notes (Signed)
Daily Session Note  Patient Details  Name: Joshua Holder MRN: [id number] Date of Birth: [birth year] Referring Provider:     Cardiac Rehab from 04/14/2020 in [facility] Cardiac and Pulmonary Rehab  Referring Provider Kathlyn Sacramento, MD      Encounter Date: 07/06/2020  Check In:  Session Check In - 07/06/20 0805      Check-In   Supervising physician immediately available to respond to emergencies See telemetry face sheet for immediately available ER MD    Location [facility]-Cardiac & Pulmonary Rehab    Staff Present Darel Hong, RN BSN;Joseph Foy [place], [place], ACSM CEP, Exercise Physiologist    Virtual Visit No    Medication changes reported     No    Fall or balance concerns reported    No    Warm-up and Cool-down Performed on first and last piece of equipment    Resistance Training Performed Yes    VAD Patient? No    PAD/SET Patient? No      Pain Assessment   Currently in Pain? No/denies              Social History   Tobacco Use  Smoking Status Never Smoker  Smokeless Tobacco Never Used    Goals Met:  Independence with exercise equipment Exercise tolerated well Personal goals reviewed No report of cardiac concerns or symptoms Strength training completed today  Goals Unmet:  Not Applicable  Comments: Pt able to follow exercise prescription today without complaint.  Will continue to monitor for progression.    Dr. Emily Filbert is Medical Director for [place] and [facility].

## 2020-07-07 ENCOUNTER — Other Ambulatory Visit: Payer: Self-pay

## 2020-07-07 DIAGNOSIS — I213 ST elevation (STEMI) myocardial infarction of unspecified site: Secondary | ICD-10-CM | POA: Diagnosis not present

## 2020-07-07 NOTE — Progress Notes (Signed)
Daily Session Note  Patient Details  Name: Joshua Holder MRN: [id number] Date of Birth: [birth year] Referring Provider:     Cardiac Rehab from 04/14/2020 in [facility] Cardiac and Pulmonary Rehab  Referring Provider Kathlyn Sacramento, MD      Encounter Date: 07/07/2020  Check In:  Session Check In - 07/07/20 0950      Check-In   Supervising physician immediately available to respond to emergencies See telemetry face sheet for immediately available ER MD    Location [facility]-Cardiac & Pulmonary Rehab    Staff Present Birdie Sons, MPA, RN;Melissa Caiola RDN, Rowe Pavy, BA, ACSM CEP, Exercise Physiologist;Jessica [place], MA, RCEP, CCRP, Marylynn Pearson, MS Exercise Physiologist    Virtual Visit No    Medication changes reported     No    Fall or balance concerns reported    No    Warm-up and Cool-down Performed on first and last piece of equipment    Resistance Training Performed Yes    VAD Patient? No    PAD/SET Patient? No      Pain Assessment   Currently in Pain? No/denies              Social History   Tobacco Use  Smoking Status Never Smoker  Smokeless Tobacco Never Used    Goals Met:  Independence with exercise equipment Exercise tolerated well No report of cardiac concerns or symptoms Strength training completed today  Goals Unmet:  Not Applicable  Comments: Pt able to follow exercise prescription today without complaint.  Will continue to monitor for progression.     Dr. Emily Filbert is Medical Director for [place] and [facility].

## 2020-07-13 ENCOUNTER — Encounter: Payer: Self-pay | Admitting: *Deleted

## 2020-07-13 DIAGNOSIS — I213 ST elevation (STEMI) myocardial infarction of unspecified site: Secondary | ICD-10-CM

## 2020-07-13 NOTE — Progress Notes (Signed)
Cardiac Individual Treatment Plan  Patient Details  Name: Joshua Holder MRN: [id number] Date of Birth: 18-Feb-1966 Referring Provider:     Cardiac Rehab from 04/14/2020 in [facility] Cardiac and Pulmonary Rehab  Referring Provider Kathlyn Sacramento, MD      Initial Encounter Date:    Cardiac Rehab from 04/14/2020 in [facility] Cardiac and Pulmonary Rehab  Date 04/14/20      Visit Diagnosis: ST elevation myocardial infarction (STEMI), unspecified artery ([place])  Patient's Home Medications on Admission:  Current Outpatient Medications:  .  aspirin EC 81 MG tablet, Take 1 tablet (81 mg) by mouth once daily. Swallow whole., Disp: , Rfl:  .  atorvastatin (LIPITOR) 80 MG tablet, Take 1 tablet (80 mg total) by mouth daily., Disp: 90 tablet, Rfl: 1 .  icosapent Ethyl (VASCEPA) 1 g capsule, Take 2 capsules (2 g total) by mouth 2 (two) times daily., Disp: 360 capsule, Rfl: 1 .  Magnesium Oxide 400 MG CAPS, Take 1 capsule (400 mg total) by mouth daily., Disp: , Rfl: 0 .  metFORMIN (GLUCOPHAGE-XR) 500 MG 24 hr tablet, Take 1,000 mg by mouth daily with breakfast., Disp: , Rfl:  .  metoprolol tartrate (LOPRESSOR) 25 MG tablet, Take 1 tablet (25 mg total) by mouth 2 (two) times daily., Disp: 180 tablet, Rfl: 0 .  nitroGLYCERIN (NITROSTAT) 0.4 MG SL tablet, Place 1 tablet (0.4 mg total) under the tongue every 5 (five) minutes as needed for chest pain., Disp: 30 tablet, Rfl: 0 .  tadalafil (CIALIS) 5 MG tablet, Take 5 mg by mouth daily as needed for erectile dysfunction., Disp: , Rfl:  .  tamsulosin (FLOMAX) 0.4 MG CAPS capsule, Take 0.4 mg by mouth daily., Disp: , Rfl:  .  ticagrelor (BRILINTA) 90 MG TABS tablet, Take 1 tablet (90 mg total) by mouth 2 (two) times daily., Disp: 180 tablet, Rfl: 1  Past Medical History: Past Medical History:  Diagnosis Date  . Atrial fibrillation with RVR ([place]) 02/29/2020   a. <48 hours in setting of inferior STEMI  . Atrial flutter ([place])    a. 03/2020 - converted w/ IV dilt in ED;  b. 03/2020 Zio: no recurrent Afib/flutter.  . Combined systolic and diastolic heart failure ([place]) 02/29/2020  . Coronary artery disease 02/29/2020   a. 02/29/20 late presenting inferior STEMI thrombotic occlusion RCA with L-R collaterals. PTCA attempted but unsuccessful-->Med Rx.  . Diabetes 1.5, managed as type 2 ([place])   . Gout   . HLD (hyperlipidemia) 02/29/2020  . Hypertension   . Ischemic cardiomyopathy 02/29/2020   a. EF during cath 03/01/20 35-40%; b. Echo 03/01/20 EF 50-55%; c. Echo 03/17/20 EF 50%, gr2DD  . MI (myocardial infarction) ([place])     Tobacco Use: Social History   Tobacco Use  Smoking Status Never Smoker  Smokeless Tobacco Never Used    Labs: Recent Review Flowsheet Data    Labs for ITP Cardiac and Pulmonary Rehab Latest Ref Rng & Units 12/30/2018 03/01/2020 05/11/2020 06/17/2020   Cholestrol 0 - 200 mg/dL - 208(H) 238(H) 174   LDLCALC 0 - 99 mg/dL - 133(H) 101(H) 58   LDLDIRECT 0 - 99 mg/dL - - 126(H) -   HDL >40 mg/dL - 49 45 54   Trlycerides <150 mg/dL - 128 543(H) 312(H)   Hemoglobin A1c 4.8 - 5.6 % 7.7(H) 7.0(H) - -       Exercise Target Goals: Exercise Program Goal: Individual exercise prescription set using results from initial 6 min walk test and THRR while considering  patient's activity barriers and safety.   Exercise Prescription Goal: Initial exercise prescription builds to 30-45 minutes a day of aerobic activity, 2-3 days per week.  Home exercise guidelines will be given to patient during program as part of exercise prescription that the participant will acknowledge.   Education: Aerobic Exercise & Resistance Training: - Gives group verbal and written instruction on the various components of exercise. Focuses on aerobic and resistive training programs and the benefits of this training and how to safely progress through these programs..   Cardiac Rehab from 07/06/2020 in [facility] Cardiac and Pulmonary Rehab  Date 07/06/20  Educator [facility]  Instruction Review  Code 1- Verbalizes Understanding      Education: Exercise & Equipment Safety: - Individual verbal instruction and demonstration of equipment use and safety with use of the equipment.   Cardiac Rehab from 07/06/2020 in [facility] Cardiac and Pulmonary Rehab  Date 04/14/20  Educator [place]  Instruction Review Code 1- Verbalizes Understanding      Education: Exercise Physiology & General Exercise Guidelines: - Group verbal and written instruction with models to review the exercise physiology of the cardiovascular system and associated critical values. Provides general exercise guidelines with specific guidelines to those with heart or lung disease.    Education: Flexibility, Balance, Mind/Body Relaxation: Provides group verbal/written instruction on the benefits of flexibility and balance training, including mind/body exercise modes such as yoga, pilates and tai chi.  Demonstration and skill practice provided.   Activity Barriers & Risk Stratification:  Activity Barriers & Cardiac Risk Stratification - 04/14/20 1715      Activity Barriers & Cardiac Risk Stratification   Activity Barriers Other (comment)    Comments left leg- rod from knee to ankle (May 2000), left knee pain    Cardiac Risk Stratification High           6 Minute Walk:  6 Minute Walk    Row Name 04/14/20 1646         6 Minute Walk   Phase Initial     Distance 1553 feet     Walk Time 6 minutes     # of Rest Breaks 0     MPH 2.94     METS 4.38     RPE 11     Perceived Dyspnea  1     VO2 Peak 15.34     Symptoms Yes (comment)     Comments Left knee pain 2/10     Resting HR 84 bpm     Resting BP 122/86     Resting Oxygen Saturation  97 %     Exercise Oxygen Saturation  during 6 min walk 98 %     Max Ex. HR 133 bpm     Max Ex. BP 140/86     2 Minute Post BP 118/88            Oxygen Initial Assessment:   Oxygen Re-Evaluation:   Oxygen Discharge (Final Oxygen Re-Evaluation):   Initial Exercise  Prescription:  Initial Exercise Prescription - 04/14/20 1700      Date of Initial Exercise RX and Referring Provider   Date 04/14/20    Referring Provider Kathlyn Sacramento, MD      Treadmill   MPH 2.7    Grade 0.5    Minutes 15    METs 3.25      Recumbant Bike   Level 3    RPM 60    Minutes 15    METs 4.3  NuStep   Level 3    SPM 80    Minutes 15    METs 4.3      Arm Ergometer   Level 1    Watts --    RPM 30    Minutes 15    METs 4.3      REL-XR   Level 3    Speed 50    Minutes 15    METs 4.3      T5 Nustep   Level 2    SPM 80    Minutes 15    METs 4.3      Biostep-RELP   Level 3    SPM 50    Minutes 15    METs 4.3      Prescription Details   Frequency (times per week) 2    Duration Progress to 30 minutes of continuous aerobic without signs/symptoms of physical distress      Intensity   THRR 40-80% of Max Heartrate 117-151    Ratings of Perceived Exertion 11-13    Perceived Dyspnea 0-4      Progression   Progression Continue to progress workloads to maintain intensity without signs/symptoms of physical distress.      Resistance Training   Training Prescription Yes    Weight 4 lb    Reps 10-15           Perform Capillary Blood Glucose checks as needed.  Exercise Prescription Changes:  Exercise Prescription Changes    Row Name 04/14/20 1600 04/14/20 1700 04/27/20 1500 05/09/20 1500 05/25/20 1600     Response to Exercise   Blood Pressure (Admit) 122/86 122/86 122/70 122/86 126/80   Blood Pressure (Exercise) 140/86 140/86 150/84 124/82 142/80   Blood Pressure (Exit) 118/88 118/88 126/70 122/70 122/82   Heart Rate (Admit) 84 bpm 84 bpm 92 bpm 99 bpm 98 bpm   Heart Rate (Exercise) 133 bpm 133 bpm 134 bpm 114 bpm 111 bpm   Heart Rate (Exit) 94 bpm 94 bpm 113 bpm 88 bpm 89 bpm   Oxygen Saturation (Admit) 97 % 97 % -- -- --   Oxygen Saturation (Exercise) 98 % 98 % -- -- --   Oxygen Saturation (Exit) 97 % 97 % -- -- --   Rating of  Perceived Exertion (Exercise) 11 11 13 13 13    Perceived Dyspnea (Exercise) 1 1 -- -- --   Symptoms Left knee pain 2/10 Left knee pain 2/10 -- none none   Comments walk test results walk test results first day -- --   Duration Progress to 30 minutes of  aerobic without signs/symptoms of physical distress Progress to 30 minutes of  aerobic without signs/symptoms of physical distress -- Continue with 30 min of aerobic exercise without signs/symptoms of physical distress. Continue with 30 min of aerobic exercise without signs/symptoms of physical distress.   Intensity -- -- -- THRR unchanged THRR unchanged     Progression   Progression -- -- Continue to progress workloads to maintain intensity without signs/symptoms of physical distress. Continue to progress workloads to maintain intensity without signs/symptoms of physical distress. Continue to progress workloads to maintain intensity without signs/symptoms of physical distress.   Average METs -- -- 2.4 3.02 2.6     Resistance Training   Training Prescription -- -- Yes Yes Yes   Weight 4 lb -- 4 lb 4 lb 4 lb   Reps 10-15 -- 10-15 10-15 10-15     Interval Training   Interval Training -- -- --  No No     Treadmill   MPH -- -- 2 2.7 --   Grade -- -- 0.5 0.5 --   Minutes -- -- 15 15 --   METs -- -- 2.67 3.25 --     NuStep   Level -- -- -- 3 3   SPM -- -- -- -- 80   Minutes -- -- -- 15 15   METs -- -- -- 2.8 2.2     T5 Nustep   Level -- -- 2 2 --   SPM -- -- 80 -- --   Minutes -- -- 15 15 --   METs -- -- 2.1 -- --     Biostep-RELP   Level -- -- -- 3 3   SPM -- -- -- -- 50   Minutes -- -- -- 15 15   METs -- -- -- 3 3   Row Name 06/03/20 1100 06/07/20 0900 06/21/20 0900 07/05/20 0800       Response to Exercise   Blood Pressure (Admit) -- 134/70 142/80 122/80    Blood Pressure (Exercise) -- 138/70 148/82 136/82    Blood Pressure (Exit) -- 130/60 110/70 120/80    Heart Rate (Admit) -- 106 bpm 104 bpm 101 bpm    Heart Rate  (Exercise) -- 131 bpm 145 bpm 122 bpm    Heart Rate (Exit) -- 119 bpm 109 bpm 97 bpm    Rating of Perceived Exertion (Exercise) -- $RemoveBefor'13 14 13    'VzCxMmhCwreU$ Symptoms -- none none none    Duration -- Continue with 30 min of aerobic exercise without signs/symptoms of physical distress. Continue with 30 min of aerobic exercise without signs/symptoms of physical distress. Continue with 30 min of aerobic exercise without signs/symptoms of physical distress.    Intensity -- THRR unchanged THRR unchanged THRR unchanged      Progression   Progression -- Continue to progress workloads to maintain intensity without signs/symptoms of physical distress. Continue to progress workloads to maintain intensity without signs/symptoms of physical distress. Continue to progress workloads to maintain intensity without signs/symptoms of physical distress.    Average METs -- 2.9 3.7 3.63      Resistance Training   Training Prescription -- Yes Yes Yes    Weight -- 4 lb 5 lb 5 lb    Reps -- 10-15 10-15 10-15      Interval Training   Interval Training -- No No No      Treadmill   MPH -- 2.$Remove'7 3 3    'pLEQMlR$ Grade -- 0.$RemoveBe'5 2 2    'rthjuQsHe$ Minutes -- $RemoveBe'15 15 15    'ZxenhyNWS$ METs -- 3.25 4.12 4.12      Recumbant Bike   Level -- -- -- 3    Minutes -- -- -- 15      NuStep   Level -- -- -- 3    Minutes -- -- -- 15    METs -- -- -- 4      T5 Nustep   Level -- $Remove'2 3 3    'gyYRgiH$ SPM -- 80 80 --    Minutes -- $RemoveBe'15 15 15    'myhQxuTFT$ METs -- 2.5 3.4 3.4      Biostep-RELP   Level -- -- -- 3    Minutes -- -- -- 15    METs -- -- -- 3      Home Exercise Plan   Plans to continue exercise at Home (comment)  walking and staff videos Home (comment)  walking and staff  videos -- Home (comment)  walking and staff videos    Frequency Add 2 additional days to program exercise sessions. Add 2 additional days to program exercise sessions. -- Add 2 additional days to program exercise sessions.    Initial Home Exercises Provided 06/03/20 06/03/20 -- 06/03/20           Exercise  Comments:   Exercise Goals and Review:  Exercise Goals    Row Name 04/14/20 1650             Exercise Goals   Increase Physical Activity Yes       Intervention Provide advice, education, support and counseling about physical activity/exercise needs.;Develop an individualized exercise prescription for aerobic and resistive training based on initial evaluation findings, risk stratification, comorbidities and participant's personal goals.       Expected Outcomes Short Term: Attend rehab on a regular basis to increase amount of physical activity.;Long Term: Add in home exercise to make exercise part of routine and to increase amount of physical activity.;Long Term: Exercising regularly at least 3-5 days a week.       Increase Strength and Stamina Yes       Intervention Provide advice, education, support and counseling about physical activity/exercise needs.;Develop an individualized exercise prescription for aerobic and resistive training based on initial evaluation findings, risk stratification, comorbidities and participant's personal goals.       Expected Outcomes Short Term: Increase workloads from initial exercise prescription for resistance, speed, and METs.;Short Term: Perform resistance training exercises routinely during rehab and add in resistance training at home;Long Term: Improve cardiorespiratory fitness, muscular endurance and strength as measured by increased METs and functional capacity (6MWT)       Able to understand and use rate of perceived exertion (RPE) scale Yes       Intervention Provide education and explanation on how to use RPE scale       Expected Outcomes Short Term: Able to use RPE daily in rehab to express subjective intensity level;Long Term:  Able to use RPE to guide intensity level when exercising independently       Able to understand and use Dyspnea scale Yes       Intervention Provide education and explanation on how to use Dyspnea scale       Expected Outcomes  Short Term: Able to use Dyspnea scale daily in rehab to express subjective sense of shortness of breath during exertion;Long Term: Able to use Dyspnea scale to guide intensity level when exercising independently       Knowledge and understanding of Target Heart Rate Range (THRR) Yes       Intervention Provide education and explanation of THRR including how the numbers were predicted and where they are located for reference       Expected Outcomes Short Term: Able to state/look up THRR;Short Term: Able to use daily as guideline for intensity in rehab;Long Term: Able to use THRR to govern intensity when exercising independently       Able to check pulse independently Yes       Intervention Provide education and demonstration on how to check pulse in carotid and radial arteries.;Review the importance of being able to check your own pulse for safety during independent exercise       Expected Outcomes Short Term: Able to explain why pulse checking is important during independent exercise;Long Term: Able to check pulse independently and accurately       Understanding of Exercise Prescription Yes  Intervention Provide education, explanation, and written materials on patient's individual exercise prescription       Expected Outcomes Short Term: Able to explain program exercise prescription;Long Term: Able to explain home exercise prescription to exercise independently              Exercise Goals Re-Evaluation :  Exercise Goals Re-Evaluation    Row Name 04/18/20 1539 05/09/20 1558 05/25/20 1652 06/03/20 0901 06/07/20 0931     Exercise Goal Re-Evaluation   Exercise Goals Review Increase Physical Activity;Able to understand and use rate of perceived exertion (RPE) scale;Knowledge and understanding of Target Heart Rate Range (THRR);Understanding of Exercise Prescription;Increase Strength and Stamina;Able to check pulse independently Increase Physical Activity;Increase Strength and Stamina;Understanding  of Exercise Prescription Increase Physical Activity;Increase Strength and Stamina;Understanding of Exercise Prescription Increase Physical Activity;Increase Strength and Stamina;Understanding of Exercise Prescription Increase Physical Activity;Increase Strength and Stamina;Understanding of Exercise Prescription   Comments Reviewed RPE and dyspnea scales, THR and program prescription with pt today.  Pt voiced understanding and was given a copy of goals to take home. Only attended twice since last review.  Pt currently in ED.  Will need negative test to return.  We will continue to montior his progress. Today is Benford's first day back after being out for a while.  Staff will monitor progress. Renold has already started to walk on his other two days a week, so we talked about adding in a third day at home.  Reviewed home exercise with pt today.  Pt plans to walking at home for exercise.  Also gave access to our staff YouTube page for exercise and education videos.  Reviewed THR, pulse, RPE, sign and symptoms, pulse oximetery and when to call 911 or MD.  Also discussed weather considerations and indoor options.  Pt voiced understanding. Missael attends consistentl and works in [employer] and RPE range.  Staff will monitor progress.   Expected Outcomes Short: Use RPE daily to regulate intensity. Long: Follow program prescription in THR. Short: Improved attendance Long: Continue to follow program prescription. Short: attend consistently Long:  improve MET level Short: Start to add in a third day of exercise at home Long: Continue to improve stamina. Short: continue to exercise consistently Long:  increase MET level   Row Name 06/21/20 0912 07/05/20 0815 07/06/20 0801         Exercise Goal Re-Evaluation   Exercise Goals Review Increase Physical Activity;Increase Strength and Stamina;Understanding of Exercise Prescription Increase Physical Activity;Increase Strength and Stamina;Understanding of Exercise Prescription Increase  Physical Activity;Increase Strength and Stamina;Understanding of Exercise Prescription     Comments Kato has increased speed and grade on TM.  he is progressing well. Marisol continues to do well in rehab.  He is now up to 4 METs on the NuStep.  We will conitnue to monitor his progress. [place] has been doing well.  He enjoyed vacation last week and is now determined to get the weight back off.  He normally goes to the gym with his wife at least one extra day a week if not twice a week.  He is feeling stronger and has more stamina now then he did at the beginning.     Expected Outcomes Short: continue to progress workloads Long:  improve stamina Short: Continue to have good attendance and increase workloads Long: Continue to improve stamina Short: Get back to exercise routine Long: Continue to improve stamina.            Discharge Exercise Prescription (Final Exercise Prescription Changes):  Exercise Prescription Changes - 07/05/20 0800      Response to Exercise   Blood Pressure (Admit) 122/80    Blood Pressure (Exercise) 136/82    Blood Pressure (Exit) 120/80    Heart Rate (Admit) 101 bpm    Heart Rate (Exercise) 122 bpm    Heart Rate (Exit) 97 bpm    Rating of Perceived Exertion (Exercise) 13    Symptoms none    Duration Continue with 30 min of aerobic exercise without signs/symptoms of physical distress.    Intensity THRR unchanged      Progression   Progression Continue to progress workloads to maintain intensity without signs/symptoms of physical distress.    Average METs 3.63      Resistance Training   Training Prescription Yes    Weight 5 lb    Reps 10-15      Interval Training   Interval Training No      Treadmill   MPH 3    Grade 2    Minutes 15    METs 4.12      Recumbant Bike   Level 3    Minutes 15      NuStep   Level 3    Minutes 15    METs 4      T5 Nustep   Level 3    Minutes 15    METs 3.4      Biostep-RELP   Level 3    Minutes 15    METs 3       Home Exercise Plan   Plans to continue exercise at Home (comment)   walking and staff videos   Frequency Add 2 additional days to program exercise sessions.    Initial Home Exercises Provided 06/03/20           Nutrition:  Target Goals: Understanding of nutrition guidelines, daily intake of sodium '1500mg'$ , cholesterol '200mg'$ , calories 30% from fat and 7% or less from saturated fats, daily to have 5 or more servings of fruits and vegetables.  Education: Controlling Sodium/Reading Food Labels -Group verbal and written material supporting the discussion of sodium use in heart healthy nutrition. Review and explanation with models, verbal and written materials for utilization of the food label.   Education: General Nutrition Guidelines/Fats and Fiber: -Group instruction provided by verbal, written material, models and posters to present the general guidelines for heart healthy nutrition. Gives an explanation and review of dietary fats and fiber.   Biometrics:  Pre Biometrics - 04/14/20 1640      Pre Biometrics   Height 6' 0.5" (1.842 m)    Weight 253 lb (114.8 kg)   Verbal from patient   BMI (Calculated) 33.82    Single Leg Stand 30 seconds            Nutrition Therapy Plan and Nutrition Goals:  Nutrition Therapy & Goals - 05/30/20 1535      Nutrition Therapy   Diet Heart healthy, low Na, Gout MNT    Drug/Food Interactions Purine/Gout    Protein (specify units) 95g    Fiber 30 grams    Whole Grain Foods 3 servings    Saturated Fats 12 max. grams    Fruits and Vegetables 5 servings/day    Sodium 1.5 grams      Personal Nutrition Goals   Nutrition Goal ST: reduce soda, add new meals, review paperwork LT: leanr how to eat healthier    Comments works 3rd shift - 5 one week and 2 the  next week. B: bacon and eggs or cereal or nothing. sleep during the day. D: limits beef (has gout), has chicken and fish. Has pinto beans, black beans, green beans and lima beans, sweet  potatoes,yellow squash, onions, zucchini, salad (bell peppers, cucumbers, carrots, onions). WIll cook eggs in butter, but will normally use olive oil. On $Remov'sundays 1-2x/month will make fried chicken in vegetable oil. Drinks: pt reports not drinking enough water. Drink 3-4 bottles of 18oz soda. Discussed heart healthy eating. Pt feels he struggles with meal planning - reviewed and provided paperwork.      Intervention Plan   Intervention Prescribe, educate and counsel regarding individualized specific dietary modifications aiming towards targeted core components such as weight, hypertension, lipid management, diabetes, heart failure and other comorbidities.;Nutrition handout(s) given to patient.    Expected Outcomes Short Term Goal: Understand basic principles of dietary content, such as calories, fat, sodium, cholesterol and nutrients.;Short Term Goal: A plan has been developed with personal nutrition goals set during dietitian appointment.;Long Term Goal: Adherence to prescribed nutrition plan.           Nutrition Assessments:   MEDIFICTS Score Key:          ?70 Need to make dietary changes          40'tuSRbH$ -70 Heart Healthy Diet         ? 40 Therapeutic Level Cholesterol Diet  Nutrition Goals Re-Evaluation:  Nutrition Goals Re-Evaluation    [place] Name 07/06/20 0805             Goals   Nutrition Goal ST: reduce soda, add new meals, review paperwork LT: leanr how to eat healthier       Comment Rasool was on vacation last week and got away from his diet.  He was previously doing better, but knows he needs to focus in on his diet given his diabetes.  He is determined to try to watch his sugar intake a little more closely.       Expected Outcome Short: Reduce sugar.  Long: Continue to eat better              Nutrition Goals Discharge (Final Nutrition Goals Re-Evaluation):  Nutrition Goals Re-Evaluation - 07/06/20 0805      Goals   Nutrition Goal ST: reduce soda, add new meals, review  paperwork LT: leanr how to eat healthier    Comment Bernadette was on vacation last week and got away from his diet.  He was previously doing better, but knows he needs to focus in on his diet given his diabetes.  He is determined to try to watch his sugar intake a little more closely.    Expected Outcome Short: Reduce sugar.  Long: Continue to eat better           Psychosocial: Target Goals: Acknowledge presence or absence of significant depression and/or stress, maximize coping skills, provide positive support system. Participant is able to verbalize types and ability to use techniques and skills needed for reducing stress and depression.   Education: Depression - Provides group verbal and written instruction on the correlation between heart/lung disease and depressed mood, treatment options, and the stigmas associated with seeking treatment.   Education: Sleep Hygiene -Provides group verbal and written instruction about how sleep can affect your health.  Define sleep hygiene, discuss sleep cycles and impact of sleep habits. Review good sleep hygiene tips.     Education: Stress and Anxiety: - Provides group verbal and written instruction about the health  risks of elevated stress and causes of high stress.  Discuss the correlation between heart/lung disease and anxiety and treatment options. Review healthy ways to manage with stress and anxiety.    Initial Review & Psychosocial Screening:  Initial Psych Review & Screening - 04/08/20 1440      Initial Review   Current issues with Current Stress Concerns;Current Sleep Concerns    Source of Stress Concerns Occupation      [place]? Yes   wife     Barriers   Psychosocial barriers to participate in program There are no identifiable barriers or psychosocial needs.;The patient should benefit from training in stress management and relaxation.      Screening Interventions   Interventions Encouraged to  exercise;To provide support and resources with identified psychosocial needs;Provide feedback about the scores to participant    Expected Outcomes Short Term goal: Utilizing psychosocial counselor, staff and physician to assist with identification of specific Stressors or current issues interfering with healing process. Setting desired goal for each stressor or current issue identified.;Long Term Goal: Stressors or current issues are controlled or eliminated.;Short Term goal: Identification and review with participant of any Quality of Life or Depression concerns found by scoring the questionnaire.;Long Term goal: The participant improves quality of Life and PHQ9 Scores as seen by post scores and/or verbalization of changes           Quality of Life Scores:   Quality of Life - 04/14/20 1639      Quality of Life   Select Quality of Life      Quality of Life Scores   Health/Function Pre 14.46 %    Socioeconomic Pre 20 %    Psych/Spiritual Pre 19.71 %    Family Pre 21.6 %    GLOBAL Pre 17.83 %          Scores of 19 and below usually indicate a poorer quality of life in these areas.  A difference of  2-3 points is a clinically meaningful difference.  A difference of 2-3 points in the total score of the Quality of Life Index has been associated with significant improvement in overall quality of life, self-image, physical symptoms, and general health in studies assessing change in quality of life.  PHQ-9: Recent Review Flowsheet Data    Depression screen [facility] 2/9 05/25/2020 04/14/2020   Decreased Interest 0 1   Down, Depressed, Hopeless 0 1   PHQ - 2 Score 0 2   Altered sleeping 1 0   Tired, decreased energy 2 1   Change in appetite 0 1   Feeling bad or failure about yourself  0 1   Trouble concentrating 0 0   Moving slowly or fidgety/restless 0 0   Suicidal thoughts 0 0   PHQ-9 Score 3 5   Difficult doing work/chores Not difficult at all Somewhat difficult     Interpretation of Total  Score  Total Score Depression Severity:  1-4 = Minimal depression, 5-9 = Mild depression, 10-14 = Moderate depression, 15-19 = Moderately severe depression, 20-27 = Severe depression   Psychosocial Evaluation and Intervention:  Psychosocial Evaluation - 04/08/20 1455      Psychosocial Evaluation & Interventions   Interventions Relaxation education;Stress management education;Encouraged to exercise with the program and follow exercise prescription    Comments Verner 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 He states he is a little nervous getting back into the night shift routine. His  wife is helping him eat healthier and they both are looking forward to meeting with the dietician. His heart attack came as a surprise to him and he really wants to work hard to maintain a heart healthy lifestyle.    Expected Outcomes Short: attend cardiac rehab for education and exercise. Long; develop and maintain positive self care habits.    Continue Psychosocial Services  Follow up required by staff           Psychosocial Re-Evaluation:  Psychosocial Re-Evaluation    [place] Name 06/03/20 0902 07/06/20 0803           Psychosocial Re-Evaluation   Current issues with Current Stress Concerns;Current Sleep Concerns Current Stress Concerns;Current Sleep Concerns      Comments Niccolas works 12 hour night shifts.  Thus he does not sleep well.  After a few shifts in a row, he sleeps very well.  He usually averages about 5-6 hours a night. His biggest stressor are his home life with step kids and his 45 year old daughter.  He is doing the best that he can. Teryn continues to do the best he can with his sleep given his work schedule.  He was able to sleep well last week while on vacation.  He enjoyed vacation and not really looking forward to getting back to work.  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  He is not feeling the best and hopes  its just lingering effects.  Otherwise, things are doing okay at home.      Expected Outcomes Short: Continue to sleep as best he can Long; Continue to focus on the positive. Short; Continue to sleep when he can  Long: Stay positive      Interventions Encouraged to attend Cardiac Rehabilitation for the exercise Encouraged to attend Cardiac Rehabilitation for the exercise      Continue Psychosocial Services  Follow up required by staff Follow up required by staff             Psychosocial Discharge (Final Psychosocial Re-Evaluation):  Psychosocial Re-Evaluation - 07/06/20 0803      Psychosocial Re-Evaluation   Current issues with Current Stress Concerns;Current Sleep Concerns    Comments Marquize continues to do the best he can with his sleep given his work schedule.  He was able to sleep well last week while on vacation.  He enjoyed vacation and not really looking forward to getting back to work.  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  He is not feeling the best and hopes its just lingering effects.  Otherwise, things are doing okay at home.    Expected Outcomes Short; Continue to sleep when he can  Long: Stay positive    Interventions Encouraged to attend Cardiac Rehabilitation for the exercise    Continue Psychosocial Services  Follow up required by staff           Vocational Rehabilitation: Provide vocational rehab assistance to qualifying candidates.   Vocational Rehab Evaluation & Intervention:  Vocational Rehab - 04/08/20 1440      Initial Vocational Rehab Evaluation & Intervention   Assessment shows need for Vocational Rehabilitation No           Education: Education Goals: Education classes will be provided on a variety of topics geared toward better understanding of heart health and risk factor modification. Participant will state understanding/return demonstration of topics  presented as noted by education test  scores.  Learning Barriers/Preferences:  Learning Barriers/Preferences - 04/08/20 1440      Learning Barriers/Preferences   Learning Barriers None    Learning Preferences None           General Cardiac Education Topics:  AED/CPR: - Group verbal and written instruction with the use of models to demonstrate the basic use of the AED with the basic ABC's of resuscitation.   Anatomy & Physiology of the Heart: - Group verbal and written instruction and models provide basic cardiac anatomy and physiology, with the coronary electrical and arterial systems. Review of Valvular disease and Heart Failure   Cardiac Procedures: - Group verbal and written instruction to review commonly prescribed medications for heart disease. Reviews the medication, class of the drug, and side effects. Includes the steps to properly store meds and maintain the prescription regimen. (beta blockers and nitrates)   Cardiac Rehab from 07/06/2020 in [facility] Cardiac and Pulmonary Rehab  Date 07/06/20  Educator [facility]  Instruction Review Code 1- Verbalizes Understanding      Cardiac Medications I: - Group verbal and written instruction to review commonly prescribed medications for heart disease. Reviews the medication, class of the drug, and side effects. Includes the steps to properly store meds and maintain the prescription regimen.   Cardiac Rehab from 07/06/2020 in [facility] Cardiac and Pulmonary Rehab  Date 05/25/20  Educator SB  Instruction Review Code 1- Verbalizes Understanding      Cardiac Medications II: -Group verbal and written instruction to review commonly prescribed medications for heart disease. Reviews the medication, class of the drug, and side effects. (all other drug classes)    Go Sex-Intimacy & Heart Disease, Get SMART - Goal Setting: - Group verbal and written instruction through game format to discuss heart disease and the return to sexual intimacy. Provides group verbal and written material to  discuss and apply goal setting through the application of the S.M.A.R.T. Method.   Cardiac Rehab from 07/06/2020 in [facility] Cardiac and Pulmonary Rehab  Date 07/06/20  Educator [facility]  Instruction Review Code 1- Verbalizes Understanding      Other Matters of the Heart: - Provides group verbal, written materials and models to describe Stable Angina and Peripheral Artery. Includes description of the disease process and treatment options available to the cardiac patient.   Infection Prevention: - Provides verbal and written material to individual with discussion of infection control including proper hand washing and proper equipment cleaning during exercise session.   Cardiac Rehab from 07/06/2020 in [facility] Cardiac and Pulmonary Rehab  Date 04/14/20  Educator KL  Instruction Review Code 1- Verbalizes Understanding      Falls Prevention: - Provides verbal and written material to individual with discussion of falls prevention and safety.   Cardiac Rehab from 07/06/2020 in [facility] Cardiac and Pulmonary Rehab  Date 04/14/20  Educator KL  Instruction Review Code 1- Verbalizes Understanding      Other: -Provides group and verbal instruction on various topics (see comments)   Knowledge Questionnaire Score:  Knowledge Questionnaire Score - 04/14/20 1633      Knowledge Questionnaire Score   Pre Score 20/26: A&P, Angina, Nutrition, Exercise           Core Components/Risk Factors/Patient Goals at Admission:  Personal Goals and Risk Factors at Admission - 04/14/20 1652      Core Components/Risk Factors/Patient Goals on Admission    Weight Management Yes;Weight Loss    Intervention Weight Management: Develop a combined  nutrition and exercise program designed to reach desired caloric intake, while maintaining appropriate intake of nutrient and fiber, sodium and fats, and appropriate energy expenditure required for the weight goal.;Weight Management: Provide education and appropriate resources to  help participant work on and attain dietary goals.;Weight Management/Obesity: Establish reasonable short term and long term weight goals.    Admit Weight 253 lb (114.8 kg)    Goal Weight: Short Term 248 lb (112.5 kg)    Goal Weight: Long Term 243 lb (110.2 kg)    Expected Outcomes Short Term: Continue to assess and modify interventions until short term weight is achieved;Long Term: Adherence to nutrition and physical activity/exercise program aimed toward attainment of established weight goal;Weight Loss: Understanding of general recommendations for a balanced deficit meal plan, which promotes 1-2 lb weight loss per week and includes a negative energy balance of [phone] kcal/d;Understanding recommendations for meals to include 15-35% energy as protein, 25-35% energy from fat, 35-60% energy from carbohydrates, less than $RemoveB'200mg'iLPojmZS$  of dietary cholesterol, 20-35 gm of total fiber daily;Understanding of distribution of calorie intake throughout the day with the consumption of 4-5 meals/snacks    Diabetes Yes    Intervention Provide education about signs/symptoms and action to take for hypo/hyperglycemia.;Provide education about proper nutrition, including hydration, and aerobic/resistive exercise prescription along with prescribed medications to achieve blood glucose in normal ranges: Fasting glucose 65-99 mg/dL    Expected Outcomes Short Term: Participant verbalizes understanding of the signs/symptoms and immediate care of hyper/hypoglycemia, proper foot care and importance of medication, aerobic/resistive exercise and nutrition plan for blood glucose control.;Long Term: Attainment of HbA1C < 7%.    Hypertension Yes    Intervention Provide education on lifestyle modifcations including regular physical activity/exercise, weight management, moderate sodium restriction and increased consumption of fresh fruit, vegetables, and low fat dairy, alcohol moderation, and smoking cessation.;Monitor prescription use  compliance.    Expected Outcomes Short Term: Continued assessment and intervention until BP is < 140/70mm HG in hypertensive participants. < 130/19mm HG in hypertensive participants with diabetes, heart failure or chronic kidney disease.;Long Term: Maintenance of blood pressure at goal levels.           Education:Diabetes - Individual verbal and written instruction to review signs/symptoms of diabetes, desired ranges of glucose level fasting, after meals and with exercise. Acknowledge that pre and post exercise glucose checks will be done for 3 sessions at entry of program.   Cardiac Rehab from 07/06/2020 in [facility] Cardiac and Pulmonary Rehab  Date 04/14/20  Educator [place]  Instruction Review Code 1- Verbalizes Understanding      Education: Know Your Numbers and Risk Factors: -Group verbal and written instruction about important numbers in your health.  Discussion of what are risk factors and how they play a role in the disease process.  Review of Cholesterol, Blood Pressure, Diabetes, and BMI and the role they play in your overall health.   Core Components/Risk Factors/Patient Goals Review:   Goals and Risk Factor Review    Row Name 06/03/20 0906 07/06/20 0806           Core Components/Risk Factors/Patient Goals Review   Personal Goals Review Weight Management/Obesity;Diabetes;Hypertension;Lipids Weight Management/Obesity;Diabetes;Hypertension;Lipids      Review Ripley is doing well in rehab. He is working on weight loss and his weight has been yoyoing. However, he is trending down again.  His pressures have been good and he checks them at home.  His sugars have been in the 170s but he was not eating well and is  trying to get back to it. Abdon did not gain too much weight on vacation (only about 1-2 lbs) which made his wife mad as she gained 5-8 lb.  He is working to get it back down now that they are back.  His blood pressures have continued to do well.  However, his sugars continue to stay  elevated around the 180s.  He knows this is tied into his diet and he is going to work on it.      Expected Outcomes Short: Work on weight loss Long; Conitnue to monitor risk factors. Short; Reduce sugar and improve diabetes management Long; Conitnue to monitor risk factors.             Core Components/Risk Factors/Patient Goals at Discharge (Final Review):   Goals and Risk Factor Review - 07/06/20 0806      Core Components/Risk Factors/Patient Goals Review   Personal Goals Review Weight Management/Obesity;Diabetes;Hypertension;Lipids    Review Emillio did not gain too much weight on vacation (only about 1-2 lbs) which made his wife mad as she gained 5-8 lb.  He is working to get it back down now that they are back.  His blood pressures have continued to do well.  However, his sugars continue to stay elevated around the 180s.  He knows this is tied into his diet and he is going to work on it.    Expected Outcomes Short; Reduce sugar and improve diabetes management Long; Conitnue to monitor risk factors.           ITP Comments:  ITP Comments    Row Name 04/08/20 1450 04/14/20 1645 04/18/20 1538 04/20/20 0734 05/09/20 1557   ITP Comments Initial telephone orientation completed. Diagnosis can be found in [facility] 6/21. EP orienation scheduled for Thursday, 8/5 at 2:30 Completed 6MWT and gym orientation. Initial ITP created and sent for review to Dr. Emily Filbert, Medical Director. First full day of exercise!  Patient was oriented to gym and equipment including functions, settings, policies, and procedures.  Patient's individual exercise prescription and treatment plan were reviewed.  All starting workloads were established based on the results of the 6 minute walk test done at initial orientation visit.  The plan for exercise progression was also introduced and progression will be customized based on patient's performance and goals. 30 Day review completed. Medical Director ITP review done, changes made  as directed, and signed approval by Medical Director. Pt currently (8/30) in ED for COVID symptoms/chest tightness.  Tested negative on 8/24.   [place] Name 05/18/20 1644 06/15/20 0637 07/13/20 0726       ITP Comments 30 day review completed. ITP sent to Dr. Emily Filbert, Medical Director of Cardiac and Pulmonary Rehab. Continue with ITP unless changes are made by physician. 30 Day review completed. Medical Director ITP review done, changes made as directed, and signed approval by Medical Director. 30 Day review completed. Medical Director ITP review done, changes made as directed, and signed approval by Medical Director.            Comments:

## 2020-07-15 ENCOUNTER — Other Ambulatory Visit: Payer: Self-pay

## 2020-07-15 ENCOUNTER — Encounter: Payer: Commercial Managed Care - PPO | Attending: Cardiovascular Disease | Admitting: *Deleted

## 2020-07-15 DIAGNOSIS — I213 ST elevation (STEMI) myocardial infarction of unspecified site: Secondary | ICD-10-CM | POA: Insufficient documentation

## 2020-07-15 NOTE — Progress Notes (Signed)
Daily Session Note  Patient Details  Name: Joshua Holder MRN: [id number] Date of Birth: 1965-11-23 Referring Provider:     Cardiac Rehab from 04/14/2020 in [facility] Cardiac and Pulmonary Rehab  Referring Provider Joshua Sacramento, MD      Encounter Date: 07/15/2020  Check In:  Session Check In - 07/15/20 0825      Check-In   Supervising physician immediately available to respond to emergencies See telemetry face sheet for immediately available ER MD    Location [facility]-Cardiac & Pulmonary Rehab    Staff Present Joshua Lark, RN, BSN, CCRP;Joshua Farmer, MA, RCEP, CCRP, CCET;Joshua Holder RCP,RRT,BSRT    Virtual Visit No    Medication changes reported     No    Fall or balance concerns reported    No    Warm-up and Cool-down Performed on first and last piece of equipment    Resistance Training Performed Yes    VAD Patient? No    PAD/SET Patient? No      Pain Assessment   Currently in Pain? No/denies              Social History   Tobacco Use  Smoking Status Never Smoker  Smokeless Tobacco Never Used    Goals Met:  Independence with exercise equipment Exercise tolerated well No report of cardiac concerns or symptoms  Goals Unmet:  Not Applicable  Comments: Pt able to follow exercise prescription today without complaint.  Will continue to monitor for progression. Weight up 3 pounds- Joshua Holder told staff that he ate more than his usual and had pizza.    Dr. Emily Filbert is Medical Director for [place] [facility].

## 2020-07-20 ENCOUNTER — Other Ambulatory Visit: Payer: Self-pay

## 2020-07-20 DIAGNOSIS — I213 ST elevation (STEMI) myocardial infarction of unspecified site: Secondary | ICD-10-CM

## 2020-07-20 NOTE — Progress Notes (Signed)
Daily Session Note  Patient Details  Name: Joshua Holder MRN: [id number] Date of Birth: [birth year] Referring Provider:     Cardiac Rehab from 04/14/2020 in [facility] Cardiac and Pulmonary Rehab  Referring Provider Kathlyn Sacramento, MD      Encounter Date: 07/20/2020  Check In:  Session Check In - 07/20/20 0753      Check-In   Supervising physician immediately available to respond to emergencies See telemetry face sheet for immediately available ER MD    Location [facility]-Cardiac & Pulmonary Rehab    Staff Present Birdie Sons, MPA, RN;Amanda Sommer, BA, ACSM CEP, Exercise Physiologist;Jessica Luan Pulling, MA, RCEP, CCRP, CCET    Virtual Visit No    Medication changes reported     No    Fall or balance concerns reported    No    Warm-up and Cool-down Performed on first and last piece of equipment    Resistance Training Performed Yes    VAD Patient? No    PAD/SET Patient? No      Pain Assessment   Currently in Pain? No/denies              Social History   Tobacco Use  Smoking Status Never Smoker  Smokeless Tobacco Never Used    Goals Met:  Independence with exercise equipment Exercise tolerated well Personal goals reviewed No report of cardiac concerns or symptoms Strength training completed today  Goals Unmet:  Not Applicable  Comments: Pt able to follow exercise prescription today without complaint.  Will continue to monitor for progression.    Dr. Emily Filbert is Medical Director for [place] and [facility].

## 2020-07-21 DIAGNOSIS — I213 ST elevation (STEMI) myocardial infarction of unspecified site: Secondary | ICD-10-CM | POA: Diagnosis not present

## 2020-07-26 ENCOUNTER — Encounter: Payer: Commercial Managed Care - PPO | Admitting: *Deleted

## 2020-07-26 ENCOUNTER — Other Ambulatory Visit: Payer: Self-pay

## 2020-07-26 DIAGNOSIS — I213 ST elevation (STEMI) myocardial infarction of unspecified site: Secondary | ICD-10-CM

## 2020-07-26 NOTE — Progress Notes (Signed)
Incomplete Session Note  Patient Details  Name: Joshua Holder MRN: [id number] Date of Birth: 10-Nov-1965 Referring Provider:     Cardiac Rehab from 04/14/2020 in [facility] Cardiac and Pulmonary Rehab  Referring Provider Joshua Bears, MD      Joshua Holder did not complete his rehab session.  Joshua Holder arrived to session and as he was getting checked in he becme upset. He voiced that he is under a lot of stress at the moment and he is needing help.  Has been sober for 4 days and recently his wife told him that she is leaving.     He is actively looking for admission to an Alcohol Rehab facility.  Called [facility] to come and talk with Joshua Holder before he leaves today.

## 2020-07-27 NOTE — Progress Notes (Signed)
Cardiology Office Note    Date:  07/28/2020   ID:  Erskine Steinfeldt, DOB 1966/04/27, MRN [id number]  PCP:  System, Provider Not In  Cardiologist:  Lorine Bears, MD  Electrophysiologist:  None   Chief Complaint: Needs letter  History of Present Illness:   Joshua Holder is a 54 y.o. male with history of CAD with late presenting inferior ST elevation MI in 02/2020 with thrombotic occlusion of the RCA with left-to-right collaterals, HFrEF secondary to ICM, PAF/flutter, pericarditis, DM2, HTN, HLD, EtOH use, and gout who presents for request of letter for AA.  He was admitted to the hospital in 02/2020 with a late presenting inferior STEMI with cath showing thrombotic occlusion of the RCA with left-to-right collaterals from the LAD.  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  EF was 35 to 40%.  PTCA of the RCA was performed but unsuccessful and medical therapy was recommended.  His post MI course was complicated by a brief episode of A. fib with conversion to sinus rhythm on IV amiodarone.  He also developed chest pain consistent with pericarditis and was treated with colchicine.  He was readmitted in 03/2020 with atrial flutter that resolved with IV diltiazem.  Repeat echo at that time showed an improved LVSF with an EF of 50% with grade 2 diastolic dysfunction, normal RV systolic function and ventricular cavity size, and mild to moderate mitral regurgitation.  Given ongoing DAPT, OAC was not initiated.  He subsequently underwent outpatient cardiac monitoring in 03/2020 which showed a predominant rhythm of sinus rhythm with an average heart rate of 84 bpm, 1 run of SVT lasting 9 beats with a mean rate of 125 bpm, rare PACs and PVCs, and no evidence of A. fib/flutter.  Most of the patient triggered events were correlated with sinus tachycardia.  He was last seen in the office in 05/2020 and was doing well from a cardiac perspective though did note some chest and sinus  congestion with multiple Covid test being negative.  He indicated he had been seen at [facility] ED for URI symptoms with note reporting an EKG without acute changes and a bedside echo that was unrevealing.  He comes in doing well from a cardiac perspective.  He does continue to note intermittent chest discomfort and dyspnea that are stable and not similar to his prior MI.  He continues to work with cardiac rehab.  He is tolerating all cardiac medications without issues.  No falls, hematochezia, or melena.  He denies any palpitations, dizziness, presyncope, or syncope.  No PND, orthopnea, lower extremity swelling, or early satiety.  He comes in today requesting a letter for enrollment in AA.  This will be a 90-month program in [place], [place].  He needs this letter as part of his disability process.  He is currently working on [employer].  His last drink was 5 days ago.  He denies any symptoms of DT.   Labs independently reviewed: 06/2020 - TC 174, TG 312, HDL 54, LDL 58 05/2020 - albumin 4.5, AST/ALT normal 04/2020 - Hgb 14.1, PLT 199, potassium 3.7, BUN 16, serum creatinine 0.98 03/2020 - magnesium 1.6, TSH normal 12/2018 - A1c 7.7  Past Medical History:  Diagnosis Date  . Atrial fibrillation with RVR (HCC) 02/29/2020   a. <48 hours in setting of inferior STEMI  . Atrial flutter (HCC)    a. 03/2020 - converted w/ IV dilt in ED; b. 03/2020 Zio: no recurrent Afib/flutter.  Marland Kitchen  Combined systolic and diastolic heart failure (HCC) 02/29/2020  . Coronary artery disease 02/29/2020   a. 02/29/20 late presenting inferior STEMI thrombotic occlusion RCA with L-R collaterals. PTCA attempted but unsuccessful-->Med Rx.  . 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 Gout   . HLD (hyperlipidemia) 02/29/2020  . Hypertension   . Ischemic cardiomyopathy 02/29/2020   a. EF during cath 03/01/20 35-40%; b. Echo 03/01/20 EF 50-55%; c. Echo 03/17/20 EF 50%, gr2DD  . MI (myocardial infarction) [facility])     Past Surgical History:    Procedure Laterality Date  . CORONARY/GRAFT ACUTE MI REVASCULARIZATION N/A 03/01/2020   Procedure: Coronary/Graft Acute MI Revascularization;  Surgeon: Iran Ouch, MD;  Location: [facility] INVASIVE CV LAB;  Service: Cardiovascular;  Laterality: N/A;  . INCISION AND DRAINAGE Left 12/30/2018   Procedure: INCISION AND DRAINAGE LEFT ELBOW;  Surgeon: Juanell Fairly, MD;  Location: [facility] ORS;  Service: Orthopedics;  Laterality: Left;  . LEFT HEART CATH AND CORONARY ANGIOGRAPHY N/A 03/01/2020   Procedure: LEFT HEART CATH AND CORONARY ANGIOGRAPHY;  Surgeon: Iran Ouch, MD;  Location: [facility] INVASIVE CV LAB;  Service: Cardiovascular;  Laterality: N/A;    Current Medications: Current Meds  Medication Sig  . aspirin EC 81 MG tablet Take 1 tablet (81 mg) by mouth once daily. Swallow whole.  Marland Kitchen atorvastatin (LIPITOR) 80 MG tablet Take 1 tablet (80 mg total) by mouth daily.  Marland Kitchen icosapent Ethyl (VASCEPA) 1 g capsule Take 2 capsules (2 g total) by mouth 2 (two) times daily.  . Magnesium Oxide 400 MG CAPS Take 1 capsule (400 mg total) by mouth daily.  . metFORMIN (GLUCOPHAGE-XR) 500 MG 24 hr tablet Take 1,000 mg by mouth daily with breakfast.  . metoprolol tartrate (LOPRESSOR) 25 MG tablet Take 1 tablet (25 mg total) by mouth 2 (two) times daily.  . nitroGLYCERIN (NITROSTAT) 0.4 MG SL tablet Place 1 tablet (0.4 mg total) under the tongue every 5 (five) minutes as needed for chest pain.  . Tadalafil 2.5 MG TABS Take 1 tablet by mouth daily.  . ticagrelor (BRILINTA) 90 MG TABS tablet Take 1 tablet (90 mg total) by mouth 2 (two) times daily.    Allergies:   Lisinopril   Social History   Socioeconomic History  . Marital status: Single    Spouse name: Not on file  . Number of children: Not on file  . Years of education: Not on file  . Highest education level: Not on file  Occupational History  . Not on file  Tobacco Use  . Smoking status: Never Smoker  . Smokeless tobacco: Never Used  Substance  and Sexual Activity  . Alcohol use: Yes    Comment: social  . Drug use: Not Currently  . Sexual activity: Yes  Other Topics Concern  . Not on file  Social History Narrative  . Not on file   Social Determinants of Health   Financial Resource Strain:   . Difficulty of Paying Living Expenses: Not on file  Food Insecurity:   . Worried About Programme researcher, broadcasting/film/video in the Last Year: Not on file  . Ran Out of Food in the Last Year: Not on file  Transportation Needs:   . Lack of Transportation (Medical): Not on file  . Lack of Transportation (Non-Medical): Not on file  Physical Activity:   . Days of Exercise per Week: Not on file  . Minutes of Exercise per Session: Not on file  Stress:   . Feeling  of Stress : Not on file  Social Connections:   . Frequency of Communication with Friends and Family: Not on file  . Frequency of Social Gatherings with Friends and Family: Not on file  . Attends Religious Services: Not on file  . Active Member of Clubs or Organizations: Not on file  . Attends Banker Meetings: Not on file  . Marital Status: Not on file     Family History:  The patient's family history includes Heart attack in his father.  ROS:   Review of Systems  Constitutional: Negative for chills, diaphoresis, fever, malaise/fatigue and weight loss.  HENT: Negative for congestion.   Eyes: Negative for discharge and redness.  Respiratory: Positive for shortness of breath. Negative for cough, sputum production and wheezing.   Cardiovascular: Positive for chest pain. Negative for palpitations, orthopnea, claudication, leg swelling and PND.  Gastrointestinal: Negative for abdominal pain, blood in stool, heartburn, melena, nausea and vomiting.  Musculoskeletal: Negative for falls and myalgias.  Skin: Negative for rash.  Neurological: Negative for dizziness, tingling, tremors, sensory change, speech change, focal weakness, loss of consciousness and weakness.    Endo/Heme/Allergies: Does not bruise/bleed easily.  Psychiatric/Behavioral: Negative for substance abuse. The patient is not nervous/anxious.   All other systems reviewed and are negative.    EKGs/Labs/Other Studies Reviewed:    Studies reviewed were summarized above. The additional studies were reviewed today:  Zio patch 03/2020: 8-day ZIO monitor:  Normal sinus rhythm with an average heart rate of 84 bpm. 1 run of supraventricular tachycardia lasting 9 beats with a mean rate of 125 bpm. Rare PACs and rare PVCs. No evidence of atrial fibrillation. Most triggered events correlated with sinus tachycardia. __________  2D echo 03/2020: 1. Left ventricular ejection fraction, by estimation, is 50%. The left  ventricle has low normal function. Inferior wall hypokinesis. There is  moderate left ventricular hypertrophy. Left ventricular diastolic  parameters are consistent with Grade II  diastolic dysfunction (pseudonormalization).  2. Right ventricular systolic function is normal. The right ventricular  size is normal. There is mildly elevated pulmonary artery systolic  pressure. The estimated right ventricular systolic pressure is 38.1 mmHg.  3. Mild to moderate mitral valve regurgitation. __________  2D echo 02/2020: 1. Left ventricular ejection fraction, by estimation, is 50 to 55%. The  left ventricle has low normal function. The left ventricle demonstrates  regional wall motion abnormalities (see scoring diagram/findings for  description). There is mild left  ventricular hypertrophy. Left ventricular diastolic parameters were  normal. There is severe hypokinesis of the left ventricular, basal-mid  inferior wall and inferoseptal wall.  2. Right ventricular systolic function is normal. The right ventricular  size is not well visualized. There is normal pulmonary artery systolic  pressure.  3. The mitral valve is normal in structure. Trivial mitral valve  regurgitation. No  evidence of mitral stenosis.  4. The aortic valve is tricuspid. Aortic valve regurgitation is not  visualized. No aortic stenosis is present.  5. The inferior vena cava is dilated in size with <50% respiratory  variability, suggesting right atrial pressure of 15 mmHg. __________  LHC 02/2020:  LV end diastolic pressure is moderately elevated.  The left ventricular ejection fraction is 35-45% by visual estimate.  There is moderate left ventricular systolic dysfunction.  Prox RCA to Mid RCA lesion is 100% stenosed.  Post intervention, there is a 100% residual stenosis.  Balloon angioplasty was performed using a BALLOON TREK RX 2.5X15.  Mid LAD lesion is 40%  stenosed.   1.  Severe one-vessel coronary artery disease with thrombotic occlusion of the proximal right coronary artery with reasonable left to right collaterals from the LAD.  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 2.  Moderately reduced LV systolic function with an EF of 35 to 40% with inferior wall akinesis. 3.  Moderately elevated left ventricular end-diastolic pressure at 27 mmHg. 4.  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  I felt that continued intervention would carry a significant risk of no reflow due to distal embolization and that would risk the already developed collaterals.  Recommendations: This was a case of inferior ST elevation myocardial infarction with late presentation after more than 24 hours of symptoms.  Initial plan was to treat him medically and consider angiography in the morning but then his chest pain worsened with diaphoresis and I elected to proceed with emergent cardiac catheterization with the above results. The patient was started on Aggrastat to be continued for 18 hours. I started the patient on oral dual antiplatelet therapy with  aspirin and ticagrelor. If the patient remains clinically stable, no plans for repeat angiography and the plan is to treat him medically assuming that his left-to-right collaterals will continue to develop. If in the other hand he has recurrent symptoms or instability, relook angiography can be considered after the patient has been treated with intravenous and oral antiplatelet medications. The patient is at high risk for mechanical complications and right ventricular failure.   EKG:  EKG is ordered today.  The EKG ordered today demonstrates NSR with sinus arrhythmia, 75 bpm, prior inferior infarct, no acute ST-T changes  Recent Labs: 03/17/2020: TSH 0.786 03/23/2020: Magnesium 1.6 05/09/2020: BUN 16; Creatinine, Ser 0.98; Hemoglobin 14.1; Platelets 199; Potassium 3.7; Sodium 134 05/11/2020: ALT 16  Recent Lipid Panel    Component Value Date/Time   CHOL 174 06/17/2020 0911   CHOL 238 (H) 05/11/2020 1535   TRIG 312 (H) 06/17/2020 0911   HDL 54 06/17/2020 0911   HDL 45 05/11/2020 1535   CHOLHDL 3.2 06/17/2020 0911   VLDL 62 (H) 06/17/2020 0911   LDLCALC 58 06/17/2020 0911   LDLCALC 101 (H) 05/11/2020 1535   LDLDIRECT 126 (H) 05/11/2020 1535    PHYSICAL EXAM:    VS:  BP 118/80 (BP Location: Left Arm, Patient Position: Sitting, Cuff Size: Normal)   Pulse 75   Ht 5\' 11"  (1.803 m)   Wt 267 lb (121.1 kg)   SpO2 98%   BMI 37.24 kg/m   BMI: Body mass index is 37.24 kg/m.  Physical Exam Constitutional:      Appearance: He is well-developed.  HENT:     Head: Normocephalic and atraumatic.  Eyes:     General:        Right eye: No discharge.        Left eye: No discharge.  Neck:     Vascular: No JVD.  Cardiovascular:     Rate and Rhythm: Normal rate and regular rhythm.     Pulses: No midsystolic click and no opening snap.          Posterior tibial pulses are 2+ on the right side and 2+ on the left side.     Heart sounds: Normal heart sounds, S1 normal and S2 normal. Heart sounds not  distant. No murmur heard.  No friction rub.  Pulmonary:     Effort: Pulmonary effort is normal. No respiratory distress.     Breath sounds: Normal breath sounds. No decreased breath sounds, wheezing or rales.  Chest:     Chest wall: No tenderness.  Abdominal:     General: There is no distension.     Palpations: Abdomen is soft.     Tenderness: There is no abdominal tenderness.  Musculoskeletal:     Cervical back: Normal range of motion.  Skin:    General: Skin is warm and dry.     Nails: There is no clubbing.  Neurological:     Mental Status: He is alert and oriented to person, place, and time.  Psychiatric:        Speech: Speech normal.        Behavior: Behavior normal.        Thought Content: Thought content normal.        Judgment: Judgment normal.     Wt Readings from Last 3 Encounters:  07/28/20 267 lb (121.1 kg)  05/11/20 259 lb (117.5 kg)  04/14/20 253 lb (114.8 kg)     ASSESSMENT & PLAN:   1. CAD involving the native coronary arteries with stable angina: He is doing well with stable symptoms and continues to participate in cardiac rehab.  Continue current medical therapy including aspirin, Brilinta, atorvastatin, and metoprolol.  He has not needed any sublingual nitroglycerin.  No plans for further ischemic evaluation at this time.  2. HFrEF secondary to ICM: He has had subsequent improvement in his LV SF with low normal EF of 50% by echo as outlined above.  He appears euvolemic and well compensated with NYHA class I symptoms.  Continue metoprolol.  Historically relative hypotension has precluded escalation of GDMT.  In the setting of him undergoing alcohol cessation with planned enrollment in a 42-month rehab program we will defer initiation of further medical therapy at this time.  CHF education.  3. History of pericarditis: Resolved.  4. PAF/flutter: No further episodes of palpitations.  Outpatient cardiac monitoring did not demonstrate any A. fib/flutter.  Given  need for DAPT OAC has been deferred.  He has a CHA2DS2-VASc of 4, therefore it showed he developed recurrent arrhythmia we may need to revisit OAC.  At that time, would also have a low threshold to refer to EP for further management.  Hopefully, with decreased alcohol consumption his atrial arrhythmia burden remain quiescent.  5. HTN: Blood pressure is well controlled in the office today.  Continue metoprolol.  6. HLD/hypertriglyceridemia: LDL 58 from 06/2020 with normal LFT in 05/2020.  Triglycerides of 312 from 06/2020.  With decreasing and subsequent complete cessation of alcohol history of the steroids are likely to improve and he may no longer need Vascepa.  For now, continue atorvastatin and Vascepa.  Plan to recheck fasting lipid panel and liver function in follow-up.  7. Alcohol use: Last drink was 5 days ago.  It is our recommendation that he [place] in the [place] adult [place] challenge which is a 60-month program aimed for rehabilitation in the setting of substance use.  Letter was provided today indicating this.  Disposition: F/u with Dr. Kirke Corin or an APP in 3 months.   Medication Adjustments/Labs and Tests Ordered: Current medicines are reviewed at length with the patient today.  Concerns regarding medicines are outlined above. Medication changes, Labs and Tests ordered today are summarized above and listed in the Patient Instructions accessible in Encounters.   Signed, Eula Listen, PA-C  07/28/2020 8:21 AM     [facility] [place] [address] [place], [place] [address] [phone]

## 2020-07-28 ENCOUNTER — Ambulatory Visit (INDEPENDENT_AMBULATORY_CARE_PROVIDER_SITE_OTHER): Payer: Commercial Managed Care - PPO | Admitting: Physician Assistant

## 2020-07-28 ENCOUNTER — Encounter: Payer: Self-pay | Admitting: Physician Assistant

## 2020-07-28 ENCOUNTER — Other Ambulatory Visit: Payer: Self-pay

## 2020-07-28 ENCOUNTER — Telehealth: Payer: Self-pay | Admitting: Physician Assistant

## 2020-07-28 VITALS — BP 118/80 | HR 75 | Ht 71.0 in | Wt 267.0 lb

## 2020-07-28 DIAGNOSIS — I2510 Atherosclerotic heart disease of native coronary artery without angina pectoris: Secondary | ICD-10-CM | POA: Diagnosis not present

## 2020-07-28 DIAGNOSIS — F1090 Alcohol use, unspecified, uncomplicated: Secondary | ICD-10-CM

## 2020-07-28 DIAGNOSIS — I10 Essential (primary) hypertension: Secondary | ICD-10-CM

## 2020-07-28 DIAGNOSIS — I5022 Chronic systolic (congestive) heart failure: Secondary | ICD-10-CM

## 2020-07-28 DIAGNOSIS — I255 Ischemic cardiomyopathy: Secondary | ICD-10-CM | POA: Diagnosis not present

## 2020-07-28 DIAGNOSIS — I4892 Unspecified atrial flutter: Secondary | ICD-10-CM

## 2020-07-28 DIAGNOSIS — I214 Non-ST elevation (NSTEMI) myocardial infarction: Secondary | ICD-10-CM

## 2020-07-28 DIAGNOSIS — E785 Hyperlipidemia, unspecified: Secondary | ICD-10-CM

## 2020-07-28 DIAGNOSIS — I480 Paroxysmal atrial fibrillation: Secondary | ICD-10-CM

## 2020-07-28 DIAGNOSIS — Z7289 Other problems related to lifestyle: Secondary | ICD-10-CM

## 2020-07-28 DIAGNOSIS — Z789 Other specified health status: Secondary | ICD-10-CM

## 2020-07-28 NOTE — Telephone Encounter (Signed)
Attempted to schedule 3 m fu per checkout.  Patient will be in a treatment program and unavailable.  Patient will call when he returns to set up an appt.

## 2020-07-28 NOTE — Patient Instructions (Signed)
Medication Instructions:  Your physician recommends that you continue on your current medications as directed. Please refer to the Current Medication list given to you today.  *If you need a refill on your cardiac medications before your next appointment, please call your pharmacy*   Lab Work: None ordered.   Testing/Procedures: None ordered.   Follow-Up: At [facility], you and your health needs are our priority.  As part of our continuing mission to provide you with exceptional heart care, we have created designated Provider Care Teams.  These Care Teams include your primary Cardiologist (physician) and Advanced Practice Providers (APPs -  Physician Assistants and Nurse Practitioners) who all work together to provide you with the care you need, when you need it.  We recommend signing up for the patient portal called "MyChart".  Sign up information is provided on this After Visit Summary.  MyChart is used to connect with patients for Virtual Visits (Telemedicine).  Patients are able to view lab/test results, encounter notes, upcoming appointments, etc.  Non-urgent messages can be sent to your provider as well.   To learn more about what you can do with MyChart, go to [email or web address].    Your next appointment:   3 month(s)  The format for your next appointment:   In Person  Provider:   You may see Lorine Bears, MD or one of the following Advanced Practice Providers on your designated Care Team:    Nicolasa Ducking, NP  Eula Listen, PA-C  Marisue Ivan, PA-C  Cadence [employer], [place]  Gillian Shields, NP

## 2020-08-02 ENCOUNTER — Telehealth: Payer: Self-pay | Admitting: Physician Assistant

## 2020-08-02 NOTE — Telephone Encounter (Signed)
Forms received from [employer] and sent to medical records in [place]

## 2020-08-10 ENCOUNTER — Encounter: Payer: Self-pay | Admitting: *Deleted

## 2020-08-10 DIAGNOSIS — I213 ST elevation (STEMI) myocardial infarction of unspecified site: Secondary | ICD-10-CM

## 2020-08-10 NOTE — Progress Notes (Signed)
Cardiac Individual Treatment Plan  Patient Details  Name: Joshua Holder MRN: [id number] Date of Birth: 1966-02-27 Referring Provider:     Cardiac Rehab from 04/14/2020 in [facility] Cardiac and Pulmonary Rehab  Referring Provider Kathlyn Sacramento, MD      Initial Encounter Date:    Cardiac Rehab from 04/14/2020 in [facility] Cardiac and Pulmonary Rehab  Date 04/14/20      Visit Diagnosis: ST elevation myocardial infarction (STEMI), unspecified artery ([place])  Patient's Home Medications on Admission:  Current Outpatient Medications:  .  aspirin EC 81 MG tablet, Take 1 tablet (81 mg) by mouth once daily. Swallow whole., Disp: , Rfl:  .  atorvastatin (LIPITOR) 80 MG tablet, Take 1 tablet (80 mg total) by mouth daily., Disp: 90 tablet, Rfl: 1 .  icosapent Ethyl (VASCEPA) 1 g capsule, Take 2 capsules (2 g total) by mouth 2 (two) times daily., Disp: 360 capsule, Rfl: 1 .  Magnesium Oxide 400 MG CAPS, Take 1 capsule (400 mg total) by mouth daily., Disp: , Rfl: 0 .  metFORMIN (GLUCOPHAGE-XR) 500 MG 24 hr tablet, Take 1,000 mg by mouth daily with breakfast., Disp: , Rfl:  .  metoprolol tartrate (LOPRESSOR) 25 MG tablet, Take 1 tablet (25 mg total) by mouth 2 (two) times daily., Disp: 180 tablet, Rfl: 0 .  nitroGLYCERIN (NITROSTAT) 0.4 MG SL tablet, Place 1 tablet (0.4 mg total) under the tongue every 5 (five) minutes as needed for chest pain., Disp: 30 tablet, Rfl: 0 .  Tadalafil 2.5 MG TABS, Take 1 tablet by mouth daily., Disp: , Rfl:  .  ticagrelor (BRILINTA) 90 MG TABS tablet, Take 1 tablet (90 mg total) by mouth 2 (two) times daily., Disp: 180 tablet, Rfl: 1  Past Medical History: Past Medical History:  Diagnosis Date  . Atrial fibrillation with RVR ([place]) 02/29/2020   a. <48 hours in setting of inferior STEMI  . Atrial flutter ([place])    a. 03/2020 - converted w/ IV dilt in ED; b. 03/2020 Zio: no recurrent Afib/flutter.  . Combined systolic and diastolic heart failure ([place]) 02/29/2020  . Coronary  artery disease 02/29/2020   a. 02/29/20 late presenting inferior STEMI thrombotic occlusion RCA with L-R collaterals. PTCA attempted but unsuccessful-->Med Rx.  . Diabetes 1.5, managed as type 2 ([place])   . Gout   . HLD (hyperlipidemia) 02/29/2020  . Hypertension   . Ischemic cardiomyopathy 02/29/2020   a. EF during cath 03/01/20 35-40%; b. Echo 03/01/20 EF 50-55%; c. Echo 03/17/20 EF 50%, gr2DD  . MI (myocardial infarction) ([place])     Tobacco Use: Social History   Tobacco Use  Smoking Status Never Smoker  Smokeless Tobacco Never Used    Labs: Recent Review Flowsheet Data    Labs for ITP Cardiac and Pulmonary Rehab Latest Ref Rng & Units 12/30/2018 03/01/2020 05/11/2020 06/17/2020   Cholestrol 0 - 200 mg/dL - 208(H) 238(H) 174   LDLCALC 0 - 99 mg/dL - 133(H) 101(H) 58   LDLDIRECT 0 - 99 mg/dL - - 126(H) -   HDL >40 mg/dL - 49 45 54   Trlycerides <150 mg/dL - 128 543(H) 312(H)   Hemoglobin A1c 4.8 - 5.6 % 7.7(H) 7.0(H) - -       Exercise Target Goals: Exercise Program Goal: Individual exercise prescription set using results from initial 6 min walk test and THRR while considering  patient's activity barriers and safety.   Exercise Prescription Goal: Initial exercise prescription builds to 30-45 minutes a day of aerobic activity, 2-3 days  per week.  Home exercise guidelines will be given to patient during program as part of exercise prescription that the participant will acknowledge.   Education: Aerobic Exercise: - Group verbal and visual presentation on the components of exercise prescription. Introduces F.I.T.T principle from ACSM for exercise prescriptions.  Reviews F.I.T.T. principles of aerobic exercise including progression. Written material given at graduation.   Cardiac Rehab from 07/06/2020 in [facility] Cardiac and Pulmonary Rehab  Date 07/06/20  Educator [facility]  Instruction Review Code 1- Verbalizes Understanding      Education: Resistance Exercise: - Group verbal and visual  presentation on the components of exercise prescription. Introduces F.I.T.T principle from ACSM for exercise prescriptions  Reviews F.I.T.T. principles of resistance exercise including progression. Written material given at graduation.    Education: Exercise & Equipment Safety: - Individual verbal instruction and demonstration of equipment use and safety with use of the equipment.   Cardiac Rehab from 07/06/2020 in [facility] Cardiac and Pulmonary Rehab  Date 04/14/20  Educator [place]  Instruction Review Code 1- Verbalizes Understanding      Education: Exercise Physiology & General Exercise Guidelines: - Group verbal and written instruction with models to review the exercise physiology of the cardiovascular system and associated critical values. Provides general exercise guidelines with specific guidelines to those with heart or lung disease.    Education: Flexibility, Balance, Mind/Body Relaxation: - Group verbal and visual presentation with interactive activity on the components of exercise prescription. Introduces F.I.T.T principle from ACSM for exercise prescriptions. Reviews F.I.T.T. principles of flexibility and balance exercise training including progression. Also discusses the mind body connection.  Reviews various relaxation techniques to help reduce and manage stress (i.e. Deep breathing, progressive muscle relaxation, and visualization). Balance handout provided to take home. Written material given at graduation.   Activity Barriers & Risk Stratification:  Activity Barriers & Cardiac Risk Stratification - 04/14/20 1715      Activity Barriers & Cardiac Risk Stratification   Activity Barriers Other (comment)    Comments left leg- rod from knee to ankle (May 2000), left knee pain    Cardiac Risk Stratification High           6 Minute Walk:  6 Minute Walk    Row Name 04/14/20 1646         6 Minute Walk   Phase Initial     Distance 1553 feet     Walk Time 6 minutes     # of Rest  Breaks 0     MPH 2.94     METS 4.38     RPE 11     Perceived Dyspnea  1     VO2 Peak 15.34     Symptoms Yes (comment)     Comments Left knee pain 2/10     Resting HR 84 bpm     Resting BP 122/86     Resting Oxygen Saturation  97 %     Exercise Oxygen Saturation  during 6 min walk 98 %     Max Ex. HR 133 bpm     Max Ex. BP 140/86     2 Minute Post BP 118/88            Oxygen Initial Assessment:   Oxygen Re-Evaluation:   Oxygen Discharge (Final Oxygen Re-Evaluation):   Initial Exercise Prescription:  Initial Exercise Prescription - 04/14/20 1700      Date of Initial Exercise RX and Referring Provider   Date 04/14/20    Referring Provider Kathlyn Sacramento,  MD      Treadmill   MPH 2.7    Grade 0.5    Minutes 15    METs 3.25      Recumbant Bike   Level 3    RPM 60    Minutes 15    METs 4.3      NuStep   Level 3    SPM 80    Minutes 15    METs 4.3      Arm Ergometer   Level 1    Watts --    RPM 30    Minutes 15    METs 4.3      REL-XR   Level 3    Speed 50    Minutes 15    METs 4.3      T5 Nustep   Level 2    SPM 80    Minutes 15    METs 4.3      Biostep-RELP   Level 3    SPM 50    Minutes 15    METs 4.3      Prescription Details   Frequency (times per week) 2    Duration Progress to 30 minutes of continuous aerobic without signs/symptoms of physical distress      Intensity   THRR 40-80% of Max Heartrate 117-151    Ratings of Perceived Exertion 11-13    Perceived Dyspnea 0-4      Progression   Progression Continue to progress workloads to maintain intensity without signs/symptoms of physical distress.      Resistance Training   Training Prescription Yes    Weight 4 lb    Reps 10-15           Perform Capillary Blood Glucose checks as needed.  Exercise Prescription Changes:  Exercise Prescription Changes    Row Name 04/14/20 1600 04/14/20 1700 04/27/20 1500 05/09/20 1500 05/25/20 1600     Response to Exercise   Blood  Pressure (Admit) 122/86 122/86 122/70 122/86 126/80   Blood Pressure (Exercise) 140/86 140/86 150/84 124/82 142/80   Blood Pressure (Exit) 118/88 118/88 126/70 122/70 122/82   Heart Rate (Admit) 84 bpm 84 bpm 92 bpm 99 bpm 98 bpm   Heart Rate (Exercise) 133 bpm 133 bpm 134 bpm 114 bpm 111 bpm   Heart Rate (Exit) 94 bpm 94 bpm 113 bpm 88 bpm 89 bpm   Oxygen Saturation (Admit) 97 % 97 % -- -- --   Oxygen Saturation (Exercise) 98 % 98 % -- -- --   Oxygen Saturation (Exit) 97 % 97 % -- -- --   Rating of Perceived Exertion (Exercise) _0 Perceived Dyspnea (Exercise) 1 1 -- -- --   Symptoms Left knee pain 2/10 Left knee pain 2/10 -- none none   Comments walk test results walk test results first day -- --   Duration Progress to 30 minutes of  aerobic without signs/symptoms of physical distress Progress to 30 minutes of  aerobic without signs/symptoms of physical distress -- Continue with 30 min of aerobic exercise without signs/symptoms of physical distress. Continue with 30 min of aerobic exercise without signs/symptoms of physical distress.   Intensity -- -- -- THRR unchanged THRR unchanged     Progression   Progression -- -- Continue to progress workloads to maintain intensity without signs/symptoms of physical distress. Continue to progress workloads to maintain intensity without signs/symptoms of physical distress. Continue to progress workloads to maintain intensity without signs/symptoms of  physical distress.   Average METs -- -- 2.4 3.02 2.6     Resistance Training   Training Prescription -- -- Yes Yes Yes   Weight 4 lb -- 4 lb 4 lb 4 lb   Reps 10-15 -- 10-15 10-15 10-15     Interval Training   Interval Training -- -- -- No No     Treadmill   MPH -- -- 2 2.7 --   Grade -- -- 0.5 0.5 --   Minutes -- -- 15 15 --   METs -- -- 2.67 3.25 --     NuStep   Level -- -- -- 3 3   SPM -- -- -- -- 80   Minutes -- -- -- 15 15   METs -- -- -- 2.8 2.2     T5 Nustep   Level --  -- 2 2 --   SPM -- -- 80 -- --   Minutes -- -- 15 15 --   METs -- -- 2.1 -- --     Biostep-RELP   Level -- -- -- 3 3   SPM -- -- -- -- 50   Minutes -- -- -- 15 15   METs -- -- -- 3 3   Row Name 06/03/20 1100 06/07/20 0900 06/21/20 0900 07/05/20 0800 07/18/20 1300     Response to Exercise   Blood Pressure (Admit) -- 134/70 142/80 122/80 120/78   Blood Pressure (Exercise) -- 138/70 148/82 136/82 128/74   Blood Pressure (Exit) -- 130/60 110/70 120/80 122/60   Heart Rate (Admit) -- 106 bpm 104 bpm 101 bpm 96 bpm   Heart Rate (Exercise) -- 131 bpm 145 bpm 122 bpm 147 bpm   Heart Rate (Exit) -- 119 bpm 109 bpm 97 bpm 115 bpm   Rating of Perceived Exertion (Exercise) -- _0 Symptoms -- none none none none   Duration -- Continue with 30 min of aerobic exercise without signs/symptoms of physical distress. Continue with 30 min of aerobic exercise without signs/symptoms of physical distress. Continue with 30 min of aerobic exercise without signs/symptoms of physical distress. Continue with 30 min of aerobic exercise without signs/symptoms of physical distress.   Intensity -- THRR unchanged THRR unchanged THRR unchanged THRR unchanged     Progression   Progression -- Continue to progress workloads to maintain intensity without signs/symptoms of physical distress. Continue to progress workloads to maintain intensity without signs/symptoms of physical distress. Continue to progress workloads to maintain intensity without signs/symptoms of physical distress. Continue to progress workloads to maintain intensity without signs/symptoms of physical distress.   Average METs -- 2.9 3.7 3.63 2.7     Resistance Training   Training Prescription -- Yes Yes Yes Yes   Weight -- 4 lb 5 lb 5 lb 5 lb   Reps -- 10-15 10-15 10-15 10-15     Interval Training   Interval Training -- No No No No     Treadmill   MPH -- 2._1 2.7   Grade -- 0._2 1.5   Minutes -- _3 METs -- 3.25 4.12 4.12  3.63     Recumbant Bike   Level -- -- -- 3 --   Minutes -- -- -- 15 --     NuStep   Level -- -- -- 3 --   Minutes -- -- -- 15 --   METs -- -- -- 4 --     REL-XR  Level -- -- -- -- 5   Speed -- -- -- -- 50   Minutes -- -- -- -- 15   METs -- -- -- -- 1.8     T5 Nustep   Level -- _0 --   SPM -- 80 80 -- --   Minutes -- _1 --   METs -- 2.5 3.4 3.4 --     Biostep-RELP   Level -- -- -- 3 --   Minutes -- -- -- 15 --   METs -- -- -- 3 --     Home Exercise Plan   Plans to continue exercise at Home (comment)  walking and staff videos Home (comment)  walking and staff videos -- Home (comment)  walking and staff videos Home (comment)  walking and staff videos   Frequency Add 2 additional days to program exercise sessions. Add 2 additional days to program exercise sessions. -- Add 2 additional days to program exercise sessions. Add 2 additional days to program exercise sessions.   Initial Home Exercises Provided 06/03/20 06/03/20 -- 06/03/20 06/03/20   Row Name 08/01/20 1300             Response to Exercise   Blood Pressure (Admit) 130/78       Blood Pressure (Exercise) 142/80       Blood Pressure (Exit) 124/60       Heart Rate (Admit) 106 bpm       Heart Rate (Exercise) 159 bpm       Heart Rate (Exit) 128 bpm       Rating of Perceived Exertion (Exercise) 14       Symptoms none       Duration Continue with 30 min of aerobic exercise without signs/symptoms of physical distress.       Intensity THRR unchanged         Progression   Progression Continue to progress workloads to maintain intensity without signs/symptoms of physical distress.       Average METs 3.03         Resistance Training   Training Prescription Yes       Weight 5 lb       Reps 10-15         Interval Training   Interval Training No         Treadmill   MPH 2.7       Grade 3       Minutes 15       METs 4.19         NuStep   Level 3       Minutes 15       METs 2.9         Elliptical    Level 1       Speed 2.5       Minutes 15       METs 2         Home Exercise Plan   Plans to continue exercise at Home (comment)  walking and staff videos       Frequency Add 2 additional days to program exercise sessions.       Initial Home Exercises Provided 06/03/20              Exercise Comments:  Exercise Comments    Row Name 07/15/20 3299           Exercise Comments Weight up 3 pounds- Lionardo told staff that he ate more than his  usual and had pizza.              Exercise Goals and Review:  Exercise Goals    Row Name 04/14/20 1650             Exercise Goals   Increase Physical Activity Yes       Intervention Provide advice, education, support and counseling about physical activity/exercise needs.;Develop an individualized exercise prescription for aerobic and resistive training based on initial evaluation findings, risk stratification, comorbidities and participant's personal goals.       Expected Outcomes Short Term: Attend rehab on a regular basis to increase amount of physical activity.;Long Term: Add in home exercise to make exercise part of routine and to increase amount of physical activity.;Long Term: Exercising regularly at least 3-5 days a week.       Increase Strength and Stamina Yes       Intervention Provide advice, education, support and counseling about physical activity/exercise needs.;Develop an individualized exercise prescription for aerobic and resistive training based on initial evaluation findings, risk stratification, comorbidities and participant's personal goals.       Expected Outcomes Short Term: Increase workloads from initial exercise prescription for resistance, speed, and METs.;Short Term: Perform resistance training exercises routinely during rehab and add in resistance training at home;Long Term: Improve cardiorespiratory fitness, muscular endurance and strength as measured by increased METs and functional capacity (6MWT)       Able to  understand and use rate of perceived exertion (RPE) scale Yes       Intervention Provide education and explanation on how to use RPE scale       Expected Outcomes Short Term: Able to use RPE daily in rehab to express subjective intensity level;Long Term:  Able to use RPE to guide intensity level when exercising independently       Able to understand and use Dyspnea scale Yes       Intervention Provide education and explanation on how to use Dyspnea scale       Expected Outcomes Short Term: Able to use Dyspnea scale daily in rehab to express subjective sense of shortness of breath during exertion;Long Term: Able to use Dyspnea scale to guide intensity level when exercising independently       Knowledge and understanding of Target Heart Rate Range (THRR) Yes       Intervention Provide education and explanation of THRR including how the numbers were predicted and where they are located for reference       Expected Outcomes Short Term: Able to state/look up THRR;Short Term: Able to use daily as guideline for intensity in rehab;Long Term: Able to use THRR to govern intensity when exercising independently       Able to check pulse independently Yes       Intervention Provide education and demonstration on how to check pulse in carotid and radial arteries.;Review the importance of being able to check your own pulse for safety during independent exercise       Expected Outcomes Short Term: Able to explain why pulse checking is important during independent exercise;Long Term: Able to check pulse independently and accurately       Understanding of Exercise Prescription Yes       Intervention Provide education, explanation, and written materials on patient's individual exercise prescription       Expected Outcomes Short Term: Able to explain program exercise prescription;Long Term: Able to explain home exercise prescription to exercise independently  Exercise Goals Re-Evaluation :  Exercise  Goals Re-Evaluation    Row Name 04/18/20 1539 05/09/20 1558 05/25/20 1652 06/03/20 0901 06/07/20 0931     Exercise Goal Re-Evaluation   Exercise Goals Review Increase Physical Activity;Able to understand and use rate of perceived exertion (RPE) scale;Knowledge and understanding of Target Heart Rate Range (THRR);Understanding of Exercise Prescription;Increase Strength and Stamina;Able to check pulse independently Increase Physical Activity;Increase Strength and Stamina;Understanding of Exercise Prescription Increase Physical Activity;Increase Strength and Stamina;Understanding of Exercise Prescription Increase Physical Activity;Increase Strength and Stamina;Understanding of Exercise Prescription Increase Physical Activity;Increase Strength and Stamina;Understanding of Exercise Prescription   Comments Reviewed RPE and dyspnea scales, THR and program prescription with pt today.  Pt voiced understanding and was given a copy of goals to take home. Only attended twice since last review.  Pt currently in ED.  Will need negative test to return.  We will continue to montior his progress. Today is Alick's first day back after being out for a while.  Staff will monitor progress. Caylin has already started to walk on his other two days a week, so we talked about adding in a third day at home.  Reviewed home exercise with pt today.  Pt plans to walking at home for exercise.  Also gave access to our staff YouTube page for exercise and education videos.  Reviewed THR, pulse, RPE, sign and symptoms, pulse oximetery and when to call 911 or MD.  Also discussed weather considerations and indoor options.  Pt voiced understanding. Dahmir attends consistentl and works in [employer] and RPE range.  Staff will monitor progress.   Expected Outcomes Short: Use RPE daily to regulate intensity. Long: Follow program prescription in THR. Short: Improved attendance Long: Continue to follow program prescription. Short: attend consistently Long:   improve MET level Short: Start to add in a third day of exercise at home Long: Continue to improve stamina. Short: continue to exercise consistently Long:  increase MET level   Row Name 06/21/20 0912 07/05/20 0815 07/06/20 0801 07/18/20 1326 07/20/20 0755     Exercise Goal Re-Evaluation   Exercise Goals Review Increase Physical Activity;Increase Strength and Stamina;Understanding of Exercise Prescription Increase Physical Activity;Increase Strength and Stamina;Understanding of Exercise Prescription Increase Physical Activity;Increase Strength and Stamina;Understanding of Exercise Prescription -- Increase Physical Activity;Increase Strength and Stamina;Understanding of Exercise Prescription   Comments Javarion has increased speed and grade on TM.  he is progressing well. Clinten continues to do well in rehab.  He is now up to 4 METs on the NuStep.  We will conitnue to monitor his progress. Norvil has been doing well.  He enjoyed vacation last week and is now determined to get the weight back off.  He normally goes to the gym with his wife at least one extra day a week if not twice a week.  He is feeling stronger and has more stamina now then he did at the beginning. Karma works in [employer] and RPE range when he attends.  As noted, he also works out on days not at [employer].  Staff will monitor progress. Xavion is doing well in rehab.  His wife has been going to [employer], and he hopes to join her there on occasion.  His weight is up today and he feels more tired.  Overall, he was doing better.   Expected Outcomes Short: continue to progress workloads Long:  improve stamina Short: Continue to have good attendance and increase workloads Long: Continue to improve stamina Short: Get back to exercise routine  Long: Continue to improve stamina. Short: exercise consistently Long: improve MET level Short: Get back to exercise at home Long; continue to improve stamina.   [place] Name 08/01/20 1258             Exercise Goal  Re-Evaluation   Exercise Goals Review Increase Physical Activity;Increase Strength and Stamina;Understanding of Exercise Prescription       Comments Demetrio has not attended since he came in so upset on 11/16.  The office notes him wanting to attend a 7 week rehabilitation program.  We are hoping to hear from him this week.  He had been doing well in rehab and was up 2.7 mph with 3% grade.  We will continue to monitor his progress.       Expected Outcomes Short: Return to regular attendance Long: Continue to exercise independently.              Discharge Exercise Prescription (Final Exercise Prescription Changes):  Exercise Prescription Changes - 08/01/20 1300      Response to Exercise   Blood Pressure (Admit) 130/78    Blood Pressure (Exercise) 142/80    Blood Pressure (Exit) 124/60    Heart Rate (Admit) 106 bpm    Heart Rate (Exercise) 159 bpm    Heart Rate (Exit) 128 bpm    Rating of Perceived Exertion (Exercise) 14    Symptoms none    Duration Continue with 30 min of aerobic exercise without signs/symptoms of physical distress.    Intensity THRR unchanged      Progression   Progression Continue to progress workloads to maintain intensity without signs/symptoms of physical distress.    Average METs 3.03      Resistance Training   Training Prescription Yes    Weight 5 lb    Reps 10-15      Interval Training   Interval Training No      Treadmill   MPH 2.7    Grade 3    Minutes 15    METs 4.19      NuStep   Level 3    Minutes 15    METs 2.9      Elliptical   Level 1    Speed 2.5    Minutes 15    METs 2      Home Exercise Plan   Plans to continue exercise at Home (comment)   walking and staff videos   Frequency Add 2 additional days to program exercise sessions.    Initial Home Exercises Provided 06/03/20           Nutrition:  Target Goals: Understanding of nutrition guidelines, daily intake of sodium <1563m, cholesterol <20101m calories 30% from fat and  7% or less from saturated fats, daily to have 5 or more servings of fruits and vegetables.  Education: All About Nutrition: -Group instruction provided by verbal, written material, interactive activities, discussions, models, and posters to present general guidelines for heart healthy nutrition including fat, fiber, MyPlate, the role of sodium in heart healthy nutrition, utilization of the nutrition label, and utilization of this knowledge for meal planning. Follow up email sent as well. Written material given at graduation.   Biometrics:  Pre Biometrics - 04/14/20 1640      Pre Biometrics   Height 6' 0.5" (1.842 m)    Weight 253 lb (114.8 kg)   Verbal from patient   BMI (Calculated) 33.82    Single Leg Stand 30 seconds  Nutrition Therapy Plan and Nutrition Goals:  Nutrition Therapy & Goals - 05/30/20 1535      Nutrition Therapy   Diet Heart healthy, low Na, Gout MNT    Drug/Food Interactions Purine/Gout    Protein (specify units) 95g    Fiber 30 grams    [employer] 3 servings    Saturated Fats 12 max. grams    Fruits and Vegetables 5 servings/day    Sodium 1.5 grams      Personal Nutrition Goals   Nutrition Goal ST: reduce soda, add new meals, review paperwork LT: leanr how to eat healthier    Comments works 3rd shift - 5 one week and 2 the next week. B: bacon and eggs or cereal or nothing. sleep during the day. D: limits beef (has gout), has chicken and fish. Has pinto beans, black beans, green beans and lima beans, sweet potatoes,yellow squash, onions, zucchini, salad (bell peppers, cucumbers, carrots, onions). WIll cook eggs in butter, but will normally use olive oil. On sundays 1-2x/month will make fried chicken in vegetable oil. Drinks: pt reports not drinking enough water. Drink 3-4 bottles of 18oz soda. Discussed heart healthy eating. Pt feels he struggles with meal planning - reviewed and provided paperwork.      Intervention Plan   Intervention  Prescribe, educate and counsel regarding individualized specific dietary modifications aiming towards targeted core components such as weight, hypertension, lipid management, diabetes, heart failure and other comorbidities.;Nutrition handout(s) given to patient.    Expected Outcomes Short Term Goal: Understand basic principles of dietary content, such as calories, fat, sodium, cholesterol and nutrients.;Short Term Goal: A plan has been developed with personal nutrition goals set during dietitian appointment.;Long Term Goal: Adherence to prescribed nutrition plan.           Nutrition Assessments:  MEDIFICTS Score Key:  ?70 Need to make dietary changes   40-70 Heart Healthy Diet  ? 40 Therapeutic Level Cholesterol Diet   Picture Your Plate Scores:  <58 Unhealthy dietary pattern with much room for improvement.  41-50 Dietary pattern unlikely to meet recommendations for good health and room for improvement.  51-60 More healthful dietary pattern, with some room for improvement.   >60 Healthy dietary pattern, although there may be some specific behaviors that could be improved.    Nutrition Goals Re-Evaluation:  Nutrition Goals Re-Evaluation    [place] Name 07/06/20 0805 07/20/20 0756           Goals   Nutrition Goal ST: reduce soda, add new meals, review paperwork LT: leanr how to eat healthier ST: reduce soda, add new meals, review paperwork LT: leanr how to eat healthier      Comment Jamaar was on vacation last week and got away from his diet.  He was previously doing better, but knows he needs to focus in on his diet given his diabetes.  He is determined to try to watch his sugar intake a little more closely. Jove has had pizza and spaghetti this week and his weight has gone up.  They usually use zuchinni noodles, but not this time.  He is going to get back to it.  He has cut out the soda and not snacking.      Expected Outcome Short: Reduce sugar.  Long: Continue to eat better  Short: Get back to diet Long: Continue to eat better.             Nutrition Goals Discharge (Final Nutrition Goals Re-Evaluation):  Nutrition Goals Re-Evaluation -  07/20/20 0756      Goals   Nutrition Goal ST: reduce soda, add new meals, review paperwork LT: leanr how to eat healthier    Comment Tildon has had pizza and spaghetti this week and his weight has gone up.  They usually use zuchinni noodles, but not this time.  He is going to get back to it.  He has cut out the soda and not snacking.    Expected Outcome Short: Get back to diet Long: Continue to eat better.           Psychosocial: Target Goals: Acknowledge presence or absence of significant depression and/or stress, maximize coping skills, provide positive support system. Participant is able to verbalize types and ability to use techniques and skills needed for reducing stress and depression.   Education: Stress, Anxiety, and Depression - Group verbal and visual presentation to define topics covered.  Reviews how body is impacted by stress, anxiety, and depression.  Also discusses healthy ways to reduce stress and to treat/manage anxiety and depression.  Written material given at graduation.   Education: Sleep Hygiene -Provides group verbal and written instruction about how sleep can affect your health.  Define sleep hygiene, discuss sleep cycles and impact of sleep habits. Review good sleep hygiene tips.    Initial Review & Psychosocial Screening:  Initial Psych Review & Screening - 04/08/20 1440      Initial Review   Current issues with Current Stress Concerns;Current Sleep Concerns    Source of Stress Concerns Occupation      Humphre[place]? Yes   wife     Barriers   Psychosocial barriers to participate in program There are no identifiable barriers or psychosocial needs.;The patient should benefit from training in stress management and relaxation.      Screening Interventions    Interventions Encouraged to exercise;To provide support and resources with identified psychosocial needs;Provide feedback about the scores to participant    Expected Outcomes Short Term goal: Utilizing psychosocial counselor, staff and physician to assist with identification of specific Stressors or current issues interfering with healing process. Setting desired goal for each stressor or current issue identified.;Long Term Goal: Stressors or current issues are controlled or eliminated.;Short Term goal: Identification and review with participant of any Quality of Life or Depression concerns found by scoring the questionnaire.;Long Term goal: The participant improves quality of Life and PHQ9 Scores as seen by post scores and/or verbalization of changes           Quality of Life Scores:   Quality of Life - 04/14/20 1639      Quality of Life   Select Quality of Life      Quality of Life Scores   Health/Function Pre 14.46 %    Socioeconomic Pre 20 %    Psych/Spiritual Pre 19.71 %    Family Pre 21.6 %    GLOBAL Pre 17.83 %          Scores of 19 and below usually indicate a poorer quality of life in these areas.  A difference of  2-3 points is a clinically meaningful difference.  A difference of 2-3 points in the total score of the Quality of Life Index has been associated with significant improvement in overall quality of life, self-image, physical symptoms, and general health in studies assessing change in quality of life.  PHQ-9: Recent Review Flowsheet Data    Depression screen [facility] 2/9 05/25/2020 04/14/2020   Decreased Interest 0  1   Down, Depressed, Hopeless 0 1   PHQ - 2 Score 0 2   Altered sleeping 1 0   Tired, decreased energy 2 1   Change in appetite 0 1   Feeling bad or failure about yourself  0 1   Trouble concentrating 0 0   Moving slowly or fidgety/restless 0 0   Suicidal thoughts 0 0   PHQ-9 Score 3 5   Difficult doing work/chores Not difficult at all Somewhat difficult      Interpretation of Total Score  Total Score Depression Severity:  1-4 = Minimal depression, 5-9 = Mild depression, 10-14 = Moderate depression, 15-19 = Moderately severe depression, 20-27 = Severe depression   Psychosocial Evaluation and Intervention:  Psychosocial Evaluation - 04/08/20 1455      Psychosocial Evaluation & Interventions   Interventions Relaxation education;Stress management education;Encouraged to exercise with the program and follow exercise prescription    Comments Vincent 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 He states he is a little nervous getting back into the night shift routine. His wife is helping him eat healthier and they both are looking forward to meeting with the dietician. His heart attack came as a surprise to him and he really wants to work hard to maintain a heart healthy lifestyle.    Expected Outcomes Short: attend cardiac rehab for education and exercise. Long; develop and maintain positive self care habits.    Continue Psychosocial Services  Follow up required by staff           Psychosocial Re-Evaluation:  Psychosocial Re-Evaluation    [place] Name 06/03/20 0902 07/06/20 0803 07/20/20 0800         Psychosocial Re-Evaluation   Current issues with Current Stress Concerns;Current Sleep Concerns Current Stress Concerns;Current Sleep Concerns Current Stress Concerns;Current Sleep Concerns     Comments Jaxten works 12 hour night shifts.  Thus he does not sleep well.  After a few shifts in a row, he sleeps very well.  He usually averages about 5-6 hours a night. His biggest stressor are his home life with step kids and his 43 year old daughter.  He is doing the best that he can. Pepper continues to do the best he can with his sleep given his work schedule.  He was able to sleep well last week while on vacation.  He enjoyed vacation and not really looking forward to getting back to work.  The only bad thing from vacation is the recovery  from drinking, which he hadn't done in months.  He is not feeling the best and hopes its just lingering effects.  Otherwise, things are doing okay at home. Cougar is frustrated with his weight.  He also notes some SOB and he feels like a ticking time bomb.  He wishes he was better with his breathing.  He is doing okay with stress at work and he still likes working.  It does disrupt his sleep but he manages.     Expected Outcomes Short: Continue to sleep as best he can Long; Continue to focus on the positive. Short; Continue to sleep when he can  Long: Stay positive Short: Continue to work on weight loss Long: Continue to stay positive.     Interventions Encouraged to attend Cardiac Rehabilitation for the exercise Encouraged to attend Cardiac Rehabilitation for the exercise Encouraged to attend Cardiac Rehabilitation for the exercise     Continue Psychosocial Services  Follow up required by staff  Follow up required by staff Follow up required by staff            Psychosocial Discharge (Final Psychosocial Re-Evaluation):  Psychosocial Re-Evaluation - 07/20/20 0800      Psychosocial Re-Evaluation   Current issues with Current Stress Concerns;Current Sleep Concerns    Comments Oseas is frustrated with his weight.  He also notes some SOB and he feels like a ticking time bomb.  He wishes he was better with his breathing.  He is doing okay with stress at work and he still likes working.  It does disrupt his sleep but he manages.    Expected Outcomes Short: Continue to work on weight loss Long: Continue to stay positive.    Interventions Encouraged to attend Cardiac Rehabilitation for the exercise    Continue Psychosocial Services  Follow up required by staff           Vocational Rehabilitation: Provide vocational rehab assistance to qualifying candidates.   Vocational Rehab Evaluation & Intervention:  Vocational Rehab - 04/08/20 1440      Initial Vocational Rehab Evaluation & Intervention    Assessment shows need for Vocational Rehabilitation No           Education: Education Goals: Education classes will be provided on a variety of topics geared toward better understanding of heart health and risk factor modification. Participant will state understanding/return demonstration of topics presented as noted by education test scores.  Learning Barriers/Preferences:  Learning Barriers/Preferences - 04/08/20 1440      Learning Barriers/Preferences   Learning Barriers None    Learning Preferences None           General Cardiac Education Topics:  AED/CPR: - Group verbal and written instruction with the use of models to demonstrate the basic use of the AED with the basic ABC's of resuscitation.   Anatomy and Cardiac Procedures: - Group verbal and visual presentation and models provide information about basic cardiac anatomy and function. Reviews the testing methods done to diagnose heart disease and the outcomes of the test results. Describes the treatment choices: Medical Management, Angioplasty, or Coronary Bypass Surgery for treating various heart conditions including Myocardial Infarction, Angina, Valve Disease, and Cardiac Arrhythmias.  Written material given at graduation.   Medication Safety: - Group verbal and visual instruction to review commonly prescribed medications for heart and lung disease. Reviews the medication, class of the drug, and side effects. Includes the steps to properly store meds and maintain the prescription regimen.  Written material given at graduation.   Cardiac Rehab from 07/06/2020 in [facility] Cardiac and Pulmonary Rehab  Date 05/25/20  Educator SB  Instruction Review Code 1- Verbalizes Understanding      Intimacy: - Group verbal instruction through game format to discuss how heart and lung disease can affect sexual intimacy. Written material given at graduation..   Cardiac Rehab from 07/06/2020 in [facility] Cardiac and Pulmonary Rehab  Date  04/27/20  Educator AS  Instruction Review Code 1- Verbalizes Understanding      Know Your Numbers and Heart Failure: - Group verbal and visual instruction to discuss disease risk factors for cardiac and pulmonary disease and treatment options.  Reviews associated critical values for Overweight/Obesity, Hypertension, Cholesterol, and Diabetes.  Discusses basics of heart failure: signs/symptoms and treatments.  Introduces Heart Failure Zone chart for action plan for heart failure.  Written material given at graduation.   Infection Prevention: - Provides verbal and written material to individual with discussion of infection control including proper hand  washing and proper equipment cleaning during exercise session.   Cardiac Rehab from 07/06/2020 in [facility] Cardiac and Pulmonary Rehab  Date 04/14/20  Educator Eldred  Instruction Review Code 1- Verbalizes Understanding      Falls Prevention: - Provides verbal and written material to individual with discussion of falls prevention and safety.   Cardiac Rehab from 07/06/2020 in [facility] Cardiac and Pulmonary Rehab  Date 04/14/20  Educator [place] Grant  Instruction Review Code 1- Verbalizes Understanding      Other: -Provides group and verbal instruction on various topics (see comments)   Knowledge Questionnaire Score:  Knowledge Questionnaire Score - 04/14/20 1633      Knowledge Questionnaire Score   Pre Score 20/26: A&P, Angina, Nutrition, Exercise           Core Components/Risk Factors/Patient Goals at Admission:  Personal Goals and Risk Factors at Admission - 04/14/20 1652      Core Components/Risk Factors/Patient Goals on Admission    Weight Management Yes;Weight Loss    Intervention Weight Management: Develop a combined nutrition and exercise program designed to reach desired caloric intake, while maintaining appropriate intake of nutrient and fiber, sodium and fats, and appropriate energy expenditure required for the weight goal.;Weight  Management: Provide education and appropriate resources to help participant work on and attain dietary goals.;Weight Management/Obesity: Establish reasonable short term and long term weight goals.    Admit Weight 253 lb (114.8 kg)    Goal Weight: Short Term 248 lb (112.5 kg)    Goal Weight: Long Term 243 lb (110.2 kg)    Expected Outcomes Short Term: Continue to assess and modify interventions until short term weight is achieved;Long Term: Adherence to nutrition and physical activity/exercise program aimed toward attainment of established weight goal;Weight Loss: Understanding of general recommendations for a balanced deficit meal plan, which promotes 1-2 lb weight loss per week and includes a negative energy balance of [phone] kcal/d;Understanding recommendations for meals to include 15-35% energy as protein, 25-35% energy from fat, 35-60% energy from carbohydrates, less than 27m of dietary cholesterol, 20-35 gm of total fiber daily;Understanding of distribution of calorie intake throughout the day with the consumption of 4-5 meals/snacks    Diabetes Yes    Intervention Provide education about signs/symptoms and action to take for hypo/hyperglycemia.;Provide education about proper nutrition, including hydration, and aerobic/resistive exercise prescription along with prescribed medications to achieve blood glucose in normal ranges: Fasting glucose 65-99 mg/dL    Expected Outcomes Short Term: Participant verbalizes understanding of the signs/symptoms and immediate care of hyper/hypoglycemia, proper foot care and importance of medication, aerobic/resistive exercise and nutrition plan for blood glucose control.;Long Term: Attainment of HbA1C < 7%.    Hypertension Yes    Intervention Provide education on lifestyle modifcations including regular physical activity/exercise, weight management, moderate sodium restriction and increased consumption of fresh fruit, vegetables, and low fat dairy, alcohol  moderation, and smoking cessation.;Monitor prescription use compliance.    Expected Outcomes Short Term: Continued assessment and intervention until BP is < 140/929mHG in hypertensive participants. < 130/8047mG in hypertensive participants with diabetes, heart failure or chronic kidney disease.;Long Term: Maintenance of blood pressure at goal levels.           Education:Diabetes - Individual verbal and written instruction to review signs/symptoms of diabetes, desired ranges of glucose level fasting, after meals and with exercise. Acknowledge that pre and post exercise glucose checks will be done for 3 sessions at entry of program.   [facility]  Date 04/14/20  Educator Suarez  Instruction Review Code 1- Verbalizes Understanding      Core Components/Risk Factors/Patient Goals Review:   Goals and Risk Factor Review    [place] Name 06/03/20 4801 07/06/20 0806 07/20/20 0758         Core Components/Risk Factors/Patient Goals Review   Personal Goals Review Weight Management/Obesity;Diabetes;Hypertension;Lipids Weight Management/Obesity;Diabetes;Hypertension;Lipids Weight Management/Obesity;Diabetes;Hypertension;Lipids     Review Maria is doing well in rehab. He is working on weight loss and his weight has been yoyoing. However, he is trending down again.  His pressures have been good and he checks them at home.  His sugars have been in the 170s but he was not eating well and is trying to get back to it. Eli did not gain too much weight on vacation (only about 1-2 lbs) which made his wife mad as she gained 5-8 lb.  He is working to get it back down now that they are back.  His blood pressures have continued to do well.  However, his sugars continue to stay elevated around the 180s.  He knows this is tied into his diet and he is going to work on it. Kaelyn's weight is up today.  He is trying to work on his diet, but cheated some this week.  He is  taking more salads to work to help.  His blood sugars are doing pretty good and usually staying around 180 still.  Blood pressures continue to do well and he is off his med for it.     Expected Outcomes Short: Work on weight loss Long; Conitnue to monitor risk factors. Short; Reduce sugar and improve diabetes management Long; Conitnue to monitor risk factors. Short: Get back to diet and work on weight loss Long: COntinue to lose weight.            Core Components/Risk Factors/Patient Goals at Discharge (Final Review):   Goals and Risk Factor Review - 07/20/20 0758      Core Components/Risk Factors/Patient Goals Review   Personal Goals Review Weight Management/Obesity;Diabetes;Hypertension;Lipids    Review Channon's weight is up today.  He is trying to work on his diet, but cheated some this week.  He is taking more salads to work to help.  His blood sugars are doing pretty good and usually staying around 180 still.  Blood pressures continue to do well and he is off his med for it.    Expected Outcomes Short: Get back to diet and work on weight loss Long: COntinue to lose weight.           ITP Comments:  ITP Comments    Row Name 04/08/20 1450 04/14/20 1645 04/18/20 1538 04/20/20 0734 05/09/20 1557   ITP Comments Initial telephone orientation completed. Diagnosis can be found in [facility] 6/21. EP orienation scheduled for Thursday, 8/5 at 2:30 Completed 6MWT and gym orientation. Initial ITP created and sent for review to Dr. Emily Filbert, Medical Director. First full day of exercise!  Patient was oriented to gym and equipment including functions, settings, policies, and procedures.  Patient's individual exercise prescription and treatment plan were reviewed.  All starting workloads were established based on the results of the 6 minute walk test done at initial orientation visit.  The plan for exercise progression was also introduced and progression will be customized based on patient's performance and  goals. 30 Day review completed. Medical Director ITP review done, changes made as directed, and signed approval by Medical Director. Pt currently (  8/30) in ED for COVID symptoms/chest tightness.  Tested negative on 8/24.   [place] Name 05/18/20 1644 06/15/20 0637 07/13/20 0726 07/15/20 0826 08/01/20 1300   ITP Comments 30 day review completed. ITP sent to Dr. Emily Filbert, Medical Director of Cardiac and Pulmonary Rehab. Continue with ITP unless changes are made by physician. 30 Day review completed. Medical Director ITP review done, changes made as directed, and signed approval by Medical Director. 30 Day review completed. Medical Director ITP review done, changes made as directed, and signed approval by Medical Director. Weight up 3 pounds- Alexandro told staff that he ate more than his usual and had pizza. Weslie has not attended since he came in so upset on 11/16.  The office notes show him wanting to attend a 7 week rehabilitation program.  We are hoping to hear from him this week.   [place] Name 08/10/20 1053           ITP Comments 30 Day review completed. Medical Director ITP review done, changes made as directed, and signed approval by Medical Director. No attendance yet since 11/16              Comments:

## 2020-08-12 ENCOUNTER — Encounter: Payer: Commercial Managed Care - PPO | Attending: Cardiovascular Disease

## 2020-08-12 ENCOUNTER — Ambulatory Visit: Payer: Commercial Managed Care - PPO | Admitting: Nurse Practitioner

## 2020-08-12 DIAGNOSIS — I213 ST elevation (STEMI) myocardial infarction of unspecified site: Secondary | ICD-10-CM | POA: Insufficient documentation

## 2020-08-18 ENCOUNTER — Emergency Department (HOSPITAL_COMMUNITY): Payer: Commercial Managed Care - PPO

## 2020-08-18 ENCOUNTER — Telehealth: Payer: Self-pay | Admitting: Physician Assistant

## 2020-08-18 ENCOUNTER — Other Ambulatory Visit: Payer: Self-pay

## 2020-08-18 ENCOUNTER — Emergency Department (HOSPITAL_COMMUNITY)
Admission: EM | Admit: 2020-08-18 | Discharge: 2020-08-18 | Disposition: A | Discharge status: Home / Self Care | Payer: Commercial Managed Care - PPO | Attending: Emergency Medicine | Admitting: Emergency Medicine

## 2020-08-18 DIAGNOSIS — I110 Hypertensive heart disease with heart failure: Secondary | ICD-10-CM | POA: Insufficient documentation

## 2020-08-18 DIAGNOSIS — M25532 Pain in left wrist: Secondary | ICD-10-CM | POA: Insufficient documentation

## 2020-08-18 DIAGNOSIS — R079 Chest pain, unspecified: Secondary | ICD-10-CM | POA: Diagnosis not present

## 2020-08-18 DIAGNOSIS — E119 Type 2 diabetes mellitus without complications: Secondary | ICD-10-CM | POA: Diagnosis not present

## 2020-08-18 DIAGNOSIS — M25539 Pain in unspecified wrist: Secondary | ICD-10-CM

## 2020-08-18 DIAGNOSIS — Z7982 Long term (current) use of aspirin: Secondary | ICD-10-CM | POA: Diagnosis not present

## 2020-08-18 DIAGNOSIS — R197 Diarrhea, unspecified: Secondary | ICD-10-CM | POA: Insufficient documentation

## 2020-08-18 DIAGNOSIS — I4891 Unspecified atrial fibrillation: Secondary | ICD-10-CM | POA: Insufficient documentation

## 2020-08-18 DIAGNOSIS — Z7901 Long term (current) use of anticoagulants: Secondary | ICD-10-CM | POA: Diagnosis not present

## 2020-08-18 DIAGNOSIS — Z7984 Long term (current) use of oral hypoglycemic drugs: Secondary | ICD-10-CM | POA: Diagnosis not present

## 2020-08-18 DIAGNOSIS — Z79899 Other long term (current) drug therapy: Secondary | ICD-10-CM | POA: Diagnosis not present

## 2020-08-18 DIAGNOSIS — I5040 Unspecified combined systolic (congestive) and diastolic (congestive) heart failure: Secondary | ICD-10-CM | POA: Diagnosis not present

## 2020-08-18 DIAGNOSIS — Z794 Long term (current) use of insulin: Secondary | ICD-10-CM | POA: Diagnosis not present

## 2020-08-18 DIAGNOSIS — Z20822 Contact with and (suspected) exposure to covid-19: Secondary | ICD-10-CM | POA: Diagnosis not present

## 2020-08-18 DIAGNOSIS — R10814 Left lower quadrant abdominal tenderness: Secondary | ICD-10-CM | POA: Diagnosis not present

## 2020-08-18 DIAGNOSIS — R109 Unspecified abdominal pain: Secondary | ICD-10-CM | POA: Diagnosis not present

## 2020-08-18 DIAGNOSIS — R0600 Dyspnea, unspecified: Secondary | ICD-10-CM | POA: Insufficient documentation

## 2020-08-18 DIAGNOSIS — I2510 Atherosclerotic heart disease of native coronary artery without angina pectoris: Secondary | ICD-10-CM | POA: Diagnosis not present

## 2020-08-18 LAB — COMPREHENSIVE METABOLIC PANEL
ALT: 17 U/L (ref 0–44)
AST: 17 U/L (ref 15–41)
Albumin: 3.6 g/dL (ref 3.5–5.0)
Alkaline Phosphatase: 48 U/L (ref 38–126)
Anion gap: 15 (ref 5–15)
BUN: 17 mg/dL (ref 6–20)
CO2: 17 mmol/L — ABNORMAL LOW (ref 22–32)
Calcium: 8.9 mg/dL (ref 8.9–10.3)
Chloride: 106 mmol/L (ref 98–111)
Creatinine, Ser: 1.08 mg/dL (ref 0.61–1.24)
GFR, Estimated: >60 mL/min (ref >60)
Glucose, Bld: 129 mg/dL — ABNORMAL HIGH (ref 70–99)
Potassium: 3.1 mmol/L — ABNORMAL LOW (ref 3.5–5.1)
Sodium: 138 mmol/L (ref 135–145)
Total Bilirubin: 4.2 mg/dL — ABNORMAL HIGH (ref 0.3–1.2)
Total Protein: 6.1 g/dL — ABNORMAL LOW (ref 6.5–8.1)

## 2020-08-18 LAB — CBC WITH DIFFERENTIAL/PLATELET
Abs Immature Granulocytes: 0.02 10*3/uL (ref 0.00–0.07)
Basophils Absolute: 0.1 10*3/uL (ref 0.0–0.1)
Basophils Relative: 1 %
Eosinophils Absolute: 0.1 10*3/uL (ref 0.0–0.5)
Eosinophils Relative: 1 %
HCT: 36.8 % — ABNORMAL LOW (ref 39.0–52.0)
Hemoglobin: 12.4 g/dL — ABNORMAL LOW (ref 13.0–17.0)
Immature Granulocytes: 0 %
Lymphocytes Relative: 20 %
Lymphs Abs: 1.4 10*3/uL (ref 0.7–4.0)
MCH: 28.4 pg (ref 26.0–34.0)
MCHC: 33.7 g/dL (ref 30.0–36.0)
MCV: 84.4 fL (ref 80.0–100.0)
Monocytes Absolute: 0.5 10*3/uL (ref 0.1–1.0)
Monocytes Relative: 8 %
Neutro Abs: 4.7 10*3/uL (ref 1.7–7.7)
Neutrophils Relative %: 70 %
Platelets: 187 10*3/uL (ref 150–400)
RBC: 4.36 MIL/uL (ref 4.22–5.81)
RDW: 12.9 % (ref 11.5–15.5)
WBC: 6.7 10*3/uL (ref 4.0–10.5)
nRBC: 0 % (ref 0.0–0.2)

## 2020-08-18 LAB — TROPONIN I (HIGH SENSITIVITY)
Troponin I (High Sensitivity): 11 ng/L (ref <18)
Troponin I (High Sensitivity): 13 ng/L (ref <18)

## 2020-08-18 LAB — RESP PANEL BY RT-PCR (FLU A&B, COVID) ARPGX2
Influenza A by PCR: NEGATIVE
Influenza B by PCR: NEGATIVE
SARS Coronavirus 2 by RT PCR: NEGATIVE

## 2020-08-18 LAB — LIPASE, BLOOD: Lipase: 32 U/L (ref 11–51)

## 2020-08-18 MED ORDER — ONDANSETRON HCL 4 MG/2ML IJ SOLN
4.0000 mg | Freq: Once | INTRAMUSCULAR | Status: AC
  Administered 2020-08-18: 4 mg via INTRAVENOUS
  Filled 2020-08-18: qty 2

## 2020-08-18 MED ORDER — SODIUM CHLORIDE 0.9 % IV BOLUS
1000.0000 mL | Freq: Once | INTRAVENOUS | Status: AC
  Administered 2020-08-18: 1000 mL via INTRAVENOUS

## 2020-08-18 MED ORDER — MORPHINE SULFATE (PF) 4 MG/ML IV SOLN
4.0000 mg | Freq: Once | INTRAVENOUS | Status: AC
  Administered 2020-08-18: 4 mg via INTRAVENOUS
  Filled 2020-08-18: qty 1

## 2020-08-18 MED ORDER — IOHEXOL 300 MG/ML  SOLN
100.0000 mL | Freq: Once | INTRAMUSCULAR | Status: AC | PRN
  Administered 2020-08-18: 100 mL via INTRAVENOUS

## 2020-08-18 MED ORDER — METHYLPREDNISOLONE SODIUM SUCC 125 MG IJ SOLR
125.0000 mg | Freq: Once | INTRAMUSCULAR | Status: AC
  Administered 2020-08-18: 125 mg via INTRAVENOUS
  Filled 2020-08-18: qty 2

## 2020-08-18 MED ORDER — PREDNISONE 20 MG PO TABS: 40.0000 mg | ORAL_TABLET | Freq: Every day | ORAL | 0 refills | Status: AC

## 2020-08-18 NOTE — Telephone Encounter (Signed)
On 08/02/20 per telephone note - forms were received from [facility] and sent to Medical records in [place].  S/w Ladona Ridgel who sent these forms. She says she received confirmation that fax went through and then the request is shredded per protocol.  According to entry, Medical Records does not have.   Tried calling patient but no answer and voicemail is full. Patient may have to have [employer] resend the forms.

## 2020-08-18 NOTE — ED Provider Notes (Signed)
[facility] EMERGENCY DEPARTMENT Provider Note   CSN: [id number] Arrival date & time: 08/18/20  0453     History Chief Complaint  Patient presents with  . Chest Pain    Kizer Nobbe is a 54 y.o. male.  HPI      54yo male with history of CAD, CHF, DM, htn, hlpd, gout presents with concern for chest pain, dyspnea, diarrhea, wrist pain.  Chest pain began tonight sharp pain, left side of chest, no radiation, nitro helped it from 8 to 5/10, did not feel similar to past MI.  Diarrhea began today, 5-6 times tonight, began a few hours before chest pain. Watery.  Abd pain across lower abdomen. No black or bloody stools.  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  Denies ivdu hx, hx of trauma. Wrist pain identical to past wrist pain from gout. Improves with steroids  Had one shot for covid vax--due for second one soon.  Past Medical History:  Diagnosis Date  . Atrial fibrillation with RVR (HCC) 02/29/2020   a. <48 hours in setting of inferior STEMI  . Atrial flutter (HCC)    a. 03/2020 - converted w/ IV dilt in ED; b. 03/2020 Zio: no recurrent Afib/flutter.  . 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 Coronary artery disease 02/29/2020   a. 02/29/20 late presenting inferior STEMI thrombotic occlusion RCA with L-R collaterals. PTCA attempted but unsuccessful-->Med Rx.  . 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 Gout   . HLD (hyperlipidemia) 02/29/2020  . Hypertension   . Ischemic cardiomyopathy 02/29/2020   a. EF during cath 03/01/20 35-40%; b. Echo 03/01/20 EF 50-55%; c. Echo 03/17/20 EF 50%, gr2DD  . MI (myocardial infarction) [facility])     Patient Active Problem List   Diagnosis Date Noted  . MI (myocardial infarction) (HCC)   . Hypertension   . 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 History of  ST elevation myocardial infarction (STEMI) 02/29/20 03/17/2020  . AKI (acute kidney injury) (HCC) 03/17/2020  . Atrial flutter (HCC) 03/17/2020  . Demand ischemia (HCC) 03/17/2020  . Atrial fibrillation with RVR (HCC) 03/02/2020  . Ischemic cardiomyopathy 03/02/2020  . Acute ST elevation myocardial infarction (STEMI) of inferior wall (HCC) 03/01/2020  . NSTEMI (non-ST elevated myocardial infarction) (HCC) 02/29/2020  . HLD (hyperlipidemia) 02/29/2020  . Coronary artery disease 02/29/2020  . 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 Olecranon bursitis of left elbow 01/13/2019  . DM (diabetes mellitus) (HCC) 01/13/2019  . HTN (hypertension) 01/13/2019  . Gout 01/13/2019  . Cellulitis 12/29/2018    Past Surgical History:  Procedure Laterality Date  . CORONARY/GRAFT ACUTE MI REVASCULARIZATION N/A 03/01/2020   Procedure: Coronary/Graft Acute MI Revascularization;  Surgeon: Iran Ouch, MD;  Location: [facility] INVASIVE CV LAB;  Service: Cardiovascular;  Laterality: N/A;  . INCISION AND DRAINAGE Left 12/30/2018   Procedure: INCISION AND DRAINAGE LEFT ELBOW;  Surgeon: Juanell Fairly, MD;  Location: [facility] ORS;  Service: Orthopedics;  Laterality: Left;  . LEFT HEART CATH AND CORONARY ANGIOGRAPHY N/A 03/01/2020   Procedure: LEFT HEART CATH AND CORONARY ANGIOGRAPHY;  Surgeon: Iran Ouch, MD;  Location: [facility] INVASIVE CV LAB;  Service: Cardiovascular;  Laterality: N/A;       Family History  Problem Relation Age of Onset  . Heart  attack Father     Social History   Tobacco Use  . Smoking status: Never Smoker  . Smokeless tobacco: Never Used  Substance Use Topics  . Alcohol use: Yes    Comment: social  . Drug use: Not Currently    Home Medications Prior to Admission medications   Medication Sig Start Date End Date Taking? Authorizing Provider  aspirin EC 81 MG tablet Take 1 tablet (81 mg) by mouth once daily. Swallow whole.   Yes [provider]   atorvastatin (LIPITOR) 80 MG tablet Take 1 tablet (80 mg total) by mouth daily. 03/23/20 09/19/20 Yes Alver Sorrow, NP  icosapent Ethyl (VASCEPA) 1 g capsule Take 2 capsules (2 g total) by mouth 2 (two) times daily. 06/21/20  Yes Creig Hines, NP  Magnesium Oxide 400 MG CAPS Take 1 capsule (400 mg total) by mouth daily. Patient taking differently: Take 400 mg by mouth at bedtime. 03/24/20  Yes Alver Sorrow, NP  metFORMIN (GLUCOPHAGE-XR) 500 MG 24 hr tablet Take 1,000 mg by mouth daily with breakfast.   Yes [provider]  Multiple Vitamins-Minerals (CENTRUM SILVER 50+MEN PO) Take 1 tablet by mouth daily.   Yes [provider]  nitroGLYCERIN (NITROSTAT) 0.4 MG SL tablet Place 1 tablet (0.4 mg total) under the tongue every 5 (five) minutes as needed for chest pain. 03/18/20  Yes Lurene Shadow, MD  ticagrelor (BRILINTA) 90 MG TABS tablet Take 1 tablet (90 mg total) by mouth 2 (two) times daily. 03/23/20 09/19/20 Yes Alver Sorrow, NP  metoprolol tartrate (LOPRESSOR) 25 MG tablet Take 1 tablet (25 mg total) by mouth 2 (two) times daily. 06/08/20 12/05/20  Creig Hines, NP  predniSONE (DELTASONE) 20 MG tablet Take 2 tablets (40 mg total) by mouth daily for 4 days. 08/18/20 08/22/20  Sabas Sous, MD    Allergies    Lisinopril  Review of Systems   Review of Systems  Constitutional: Positive for appetite change, chills and fatigue. Negative for fever.  HENT: Negative for sore throat.   Eyes: Negative for visual disturbance.  Respiratory: Positive for cough and shortness of breath.   Cardiovascular: Positive for chest pain.  Gastrointestinal: Positive for abdominal pain and diarrhea. Negative for nausea and vomiting.  Genitourinary: Negative for difficulty urinating and enuresis.  Musculoskeletal: Negative for back pain and neck stiffness.  Skin: Negative for rash.  Neurological: Positive for light-headedness. Negative for syncope and  headaches.    Physical Exam Updated Vital Signs BP (!) 140/99   Pulse 94   Temp 98 F (36.7 C) (Oral)   Resp 14   Ht 5\' 11"  (1.803 m)   Wt 117.9 kg   SpO2 99%   BMI 36.26 kg/m   Physical Exam Vitals and nursing note reviewed.  Constitutional:      General: He is not in acute distress.    Appearance: He is well-developed and well-nourished. He is ill-appearing. He is not diaphoretic.  HENT:     Head: Normocephalic and atraumatic.  Eyes:     Extraocular Movements: EOM normal.     Conjunctiva/sclera: Conjunctivae normal.  Cardiovascular:     Rate and Rhythm: Normal rate and regular rhythm.     Pulses: Intact distal pulses.     Heart sounds: Normal heart sounds. No murmur heard. No friction rub. No gallop.   Pulmonary:     Effort: Pulmonary effort is normal. No respiratory distress.     Breath sounds: Normal breath sounds. No wheezing  or rales.  Abdominal:     General: There is no distension.     Palpations: Abdomen is soft.     Tenderness: There is abdominal tenderness (LLQ). There is no guarding.  Musculoskeletal:        General: No edema.     Cervical back: Normal range of motion.  Skin:    General: Skin is warm and dry.  Neurological:     Mental Status: He is alert and oriented to person, place, and time.     ED Results / Procedures / Treatments   Labs (all labs ordered are listed, but only abnormal results are displayed) Labs Reviewed  CBC WITH DIFFERENTIAL/PLATELET - Abnormal; Notable for the following components:      Result Value   Hemoglobin 12.4 (*)    HCT 36.8 (*)    All other components within normal limits  COMPREHENSIVE METABOLIC PANEL - Abnormal; Notable for the following components:   Potassium 3.1 (*)    CO2 17 (*)    Glucose, Bld 129 (*)    Total Protein 6.1 (*)    Total Bilirubin 4.2 (*)    All other components within normal limits  RESP PANEL BY RT-PCR (FLU A&B, COVID) ARPGX2  LIPASE, BLOOD  TROPONIN I (HIGH SENSITIVITY)  TROPONIN I  (HIGH SENSITIVITY)    EKG EKG Interpretation  Date/Time:  Thursday August 18 2020 05:36:52 EST Ventricular Rate:  77 PR Interval:    QRS Duration: 128 QT Interval:  388 QTC Calculation: 440 R Axis:   2 Text Interpretation: Sinus rhythm Nonspecific intraventricular conduction delay Inferior infarct, age indeterminate Lateral leads are also involved No significant change since last tracing Confirmed by Alvira Monday ([id number]) on 08/18/2020 3:53:15 PM   Radiology DG Wrist Complete Left  Result Date: 08/18/2020 CLINICAL DATA:  Left wrist pain. No known injury. History of gout. EXAM: LEFT WRIST - COMPLETE 3+ VIEW COMPARISON:  06/27/2015 FINDINGS: Overall stable appearing age advanced degenerative changes with joint space narrowing and spurring. No obvious erosions. No acute bony findings. IMPRESSION: 1. Stable age advanced degenerative changes. 2. No acute bony findings. Electronically Signed   By: Rudie Meyer M.D.   On: 08/18/2020 06:16   CT ABDOMEN PELVIS W CONTRAST  Result Date: 08/18/2020 CLINICAL DATA:  Abdominal pain and diarrhea. EXAM: CT ABDOMEN AND PELVIS WITH CONTRAST TECHNIQUE: Multidetector CT imaging of the abdomen and pelvis was performed using the standard protocol following bolus administration of intravenous contrast. CONTRAST:  OMNIPAQUE IOHEXOL 300 MG/ML  SOLN COMPARISON:  CT scan 12/29/2015 FINDINGS: Lower chest: Patchy areas of subpleural atelectasis but no infiltrates or effusions. The heart is normal in size. Age advanced coronary artery calcifications are noted. Hepatobiliary: No focal hepatic lesions or intrahepatic biliary dilatation. The gallbladder is normal. No common bile duct dilatation. Pancreas: No mass, inflammation or ductal dilatation. Spleen: Normal size. No focal lesions. Adrenals/Urinary Tract: The adrenal glands are normal. Both kidneys are normal. No renal lesions, renal calculi or hydronephrosis. No ureteral or bladder calculi. Stomach/Bowel: The  stomach, duodenum, small bowel and colon are unremarkable. No acute inflammatory changes, mass lesions or obstructive findings. The terminal ileum is normal. The appendix is normal. Vascular/Lymphatic: The aorta is normal in caliber. No dissection. The branch vessels are patent. The major venous structures are patent. No mesenteric or retroperitoneal mass or adenopathy. Small scattered lymph nodes are noted. Reproductive: The prostate gland and seminal vesicles are unremarkable. Other: No pelvic mass or adenopathy. No free pelvic fluid collections. No inguinal  mass or adenopathy. Small periumbilical abdominal wall hernia containing fat. Musculoskeletal: No significant bony findings. Bilateral pars defects are noted at L5 with advanced degenerative disc disease at L5-S1. IMPRESSION: 1. No acute abdominal/pelvic findings, mass lesions or adenopathy. 2. Age advanced coronary artery calcifications. Electronically Signed   By: Rudie Meyer M.D.   On: 08/18/2020 08:10   DG Chest Portable 1 View  Result Date: 08/18/2020 CLINICAL DATA:  Shortness of breath. EXAM: PORTABLE CHEST 1 VIEW COMPARISON:  05/09/2020. FINDINGS: Mediastinum hilar structures normal. Borderline cardiomegaly and pulmonary venous congestion. Low lung volumes with bibasilar atelectasis. Mild bibasilar interstitial prominence cannot be excluded. Small bilateral pleural effusions cannot be excluded. No pneumothorax. IMPRESSION: 1.  Borderline cardiomegaly and pulmonary venous congestion. 2. Low lung volumes with bibasilar atelectasis. Mild bibasilar interstitial prominence cannot be excluded. Small bilateral pleural effusions cannot be excluded. Electronically Signed   By: Maisie Fus  Register   On: 08/18/2020 06:12    Procedures Procedures (including critical care time)  Medications Ordered in ED Medications  morphine 4 MG/ML injection 4 mg (4 mg Intravenous Given 08/18/20 0609)  ondansetron (ZOFRAN) injection 4 mg (4 mg Intravenous Given 08/18/20  0609)  sodium chloride 0.9 % bolus 1,000 mL (0 mLs Intravenous Stopped 08/18/20 0706)  methylPREDNISolone sodium succinate (SOLU-MEDROL) 125 mg/2 mL injection 125 mg (125 mg Intravenous Given 08/18/20 0631)  iohexol (OMNIPAQUE) 300 MG/ML solution 100 mL (100 mLs Intravenous Contrast Given 08/18/20 2595)    ED Course  I have reviewed the triage vital signs and the nursing notes.  Pertinent labs & imaging results that were available during my care of the patient were reviewed by me and considered in my medical decision making (see chart for details).    MDM Rules/Calculators/A&P                          54yo male with history of CAD, CHF, DM, htn, hlpd, gout presents with concern for chest pain, dyspnea, diarrhea, wrist pain.  Wrist pain--consistent with past gout flares. Denies IVDU, afebrile, low suspicion for septic arthritis. XR without acute findings. Suspect recurrence of gout.  Given steroid.   Chest pain--EKG reviewed by me, no acute changes in comparison to prior.  Initial troponin negative. History and exam not consistent with aortic dissection or PE.  Will reevaluate, repeat troponin. Is high risk with CAD history but has other associated symptoms today with pain different from prior MI pain and plan to reassess.  Abdominal pain/diarrhea:  DDx includes viral/bacterial gastroenteritis/colitis or diverticulitis.  CT abdomen/pelvis pending. COVID 19 testing pending.    Care signed out with CT/troponin and reassessment pending.   Final Clinical Impression(s) / ED Diagnoses Final diagnoses:  Chest pain, unspecified type  Abdominal pain, unspecified abdominal location  Pain in wrist, unspecified laterality  Diarrhea, unspecified type    Rx / DC Orders ED Discharge Orders         Ordered    predniSONE (DELTASONE) 20 MG tablet  Daily        08/18/20 0955           Alvira Monday, MD 08/18/20 1559

## 2020-08-18 NOTE — Telephone Encounter (Signed)
Patient called to check status of request .    Directed to medical records in [place] but they told him to ask the providers nurse.   Please advise .

## 2020-08-18 NOTE — ED Notes (Signed)
Patient verbalizes understanding of discharge instructions. Opportunity for questioning and answers were provided. Armband removed by staff, pt discharged from ED and ambulated to lobby to return to detox center.

## 2020-08-18 NOTE — Telephone Encounter (Signed)
Medical Records In the [facility] local never received any paperwork on this patient. Shanda Bumps forward the call to the [facility] for Grier Mitts or Dr. Nancie Neas nurse to help Mr. Olarte . jc 08/18/20

## 2020-08-18 NOTE — Discharge Instructions (Addendum)
You were evaluated in the Emergency Department and after careful evaluation, we did not find any emergent condition requiring admission or further testing in the hospital.  Your exam/testing today is overall reassuring.  No evidence of heart attack, suspect your symptoms are related to a viral illness.  Please take the prednisone prescription as directed to help with your gout flare.  Please return to the Emergency Department if you experience any worsening of your condition.   Thank you for allowing [place] to be a part of your care.

## 2020-08-18 NOTE — ED Triage Notes (Signed)
BIB [facility] after pt called to report increasing c/p overnight with ShOB. Per EMS, pt has c/p with radiates to left and is tender with palpation. PT also c/o diarrha x 5 days, abd. Tenderness, and gout flare up. Pt took 324 mg of asprin and  x2 nitro SL w/o relief. Pt denies N/V. 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

## 2020-08-18 NOTE — ED Provider Notes (Signed)
  Provider Note MRN:  [id number]  Arrival date & time: 08/18/20    ED Course and Medical Decision Making  Assumed care from Dr. Dalene Seltzer at shift change.  Chest pain, abdominal pain, diarrhea, malaise, awaiting second troponin and CT abdomen.  If negative work-up and feeling better a candidate for discharge.  Troponin negative x2, CT is without acute process, patient is well-appearing with normal vital signs, feeling much better, appropriate for discharge.  Procedures  Final Clinical Impressions(s) / ED Diagnoses     ICD-10-CM   1. Chest pain, unspecified type  R07.9   2. Abdominal pain, unspecified abdominal location  R10.9   3. Pain in wrist, unspecified laterality  M25.539   4. Diarrhea, unspecified type  R19.7     ED Discharge Orders         Ordered    predniSONE (DELTASONE) 20 MG tablet  Daily        08/18/20 0955            Discharge Instructions     You were evaluated in the Emergency Department and after careful evaluation, we did not find any emergent condition requiring admission or further testing in the hospital.  Your exam/testing today is overall reassuring.  No evidence of heart attack, suspect your symptoms are related to a viral illness.  Please take the prednisone prescription as directed to help with your gout flare.  Please return to the Emergency Department if you experience any worsening of your condition.   Thank you for allowing [place] to be a part of your care.     Elmer Sow. Pilar Plate, MD [facility] Emergency Medicine [facility] [email or web address]    Sabas Sous, MD 08/18/20 [phone]

## 2020-08-22 NOTE — Telephone Encounter (Signed)
Francesco Sor calling to check the status of form request.     Aware records requests go to main office medical records in [place].    Per note below not received.  Instructed [employer] rep to fax directly to medical records at number provided.

## 2020-08-29 ENCOUNTER — Encounter: Payer: Self-pay | Admitting: *Deleted

## 2020-08-29 DIAGNOSIS — I213 ST elevation (STEMI) myocardial infarction of unspecified site: Secondary | ICD-10-CM

## 2020-08-30 NOTE — Telephone Encounter (Signed)
Received records request from [facility] , forwarded to medical records via fax

## 2020-08-30 NOTE — Telephone Encounter (Signed)
[facility] medical records [place] [phone] fax

## 2020-08-31 NOTE — Telephone Encounter (Signed)
Patient calling to check status of records request completion.  He has called several times and is having trouble getting an answer.    Please call patient is concerned his claim is going to be closed due to no response

## 2020-09-07 ENCOUNTER — Encounter: Payer: Self-pay | Admitting: *Deleted

## 2020-09-07 DIAGNOSIS — I213 ST elevation (STEMI) myocardial infarction of unspecified site: Secondary | ICD-10-CM

## 2020-09-07 NOTE — Telephone Encounter (Signed)
Patient is calling back to find out the status of him medical records request from [employer]. Please call and advise.

## 2020-09-07 NOTE — Progress Notes (Signed)
Cardiac Individual Treatment Plan  Patient Details  Name: Joshua Holder MRN: [id number] Date of Birth: July 27, 1966 Referring Provider:   Flowsheet Row Cardiac Rehab from 04/14/2020 in [facility] Cardiac and Pulmonary Rehab  Referring Provider Kathlyn Sacramento, MD      Initial Encounter Date:  Flowsheet Row Cardiac Rehab from 04/14/2020 in [facility] Cardiac and Pulmonary Rehab  Date 04/14/20      Visit Diagnosis: ST elevation myocardial infarction (STEMI), unspecified artery ([place])  Patient's Home Medications on Admission:  Current Outpatient Medications:  .  aspirin EC 81 MG tablet, Take 1 tablet (81 mg) by mouth once daily. Swallow whole., Disp: , Rfl:  .  atorvastatin (LIPITOR) 80 MG tablet, Take 1 tablet (80 mg total) by mouth daily., Disp: 90 tablet, Rfl: 1 .  icosapent Ethyl (VASCEPA) 1 g capsule, Take 2 capsules (2 g total) by mouth 2 (two) times daily., Disp: 360 capsule, Rfl: 1 .  Magnesium Oxide 400 MG CAPS, Take 1 capsule (400 mg total) by mouth daily. (Patient taking differently: Take 400 mg by mouth at bedtime.), Disp: , Rfl: 0 .  metFORMIN (GLUCOPHAGE-XR) 500 MG 24 hr tablet, Take 1,000 mg by mouth daily with breakfast., Disp: , Rfl:  .  metoprolol tartrate (LOPRESSOR) 25 MG tablet, Take 1 tablet (25 mg total) by mouth 2 (two) times daily., Disp: 180 tablet, Rfl: 0 .  Multiple Vitamins-Minerals (CENTRUM SILVER 50+MEN PO), Take 1 tablet by mouth daily., Disp: , Rfl:  .  nitroGLYCERIN (NITROSTAT) 0.4 MG SL tablet, Place 1 tablet (0.4 mg total) under the tongue every 5 (five) minutes as needed for chest pain., Disp: 30 tablet, Rfl: 0 .  ticagrelor (BRILINTA) 90 MG TABS tablet, Take 1 tablet (90 mg total) by mouth 2 (two) times daily., Disp: 180 tablet, Rfl: 1  Past Medical History: Past Medical History:  Diagnosis Date  . Atrial fibrillation with RVR ([place]) 02/29/2020   a. <48 hours in setting of inferior STEMI  . Atrial flutter ([place])    a. 03/2020 - converted w/ IV dilt in ED; b.  03/2020 Zio: no recurrent Afib/flutter.  . Combined systolic and diastolic heart failure ([place]) 02/29/2020  . Coronary artery disease 02/29/2020   a. 02/29/20 late presenting inferior STEMI thrombotic occlusion RCA with L-R collaterals. PTCA attempted but unsuccessful-->Med Rx.  . Diabetes 1.5, managed as type 2 ([place])   . Gout   . HLD (hyperlipidemia) 02/29/2020  . Hypertension   . Ischemic cardiomyopathy 02/29/2020   a. EF during cath 03/01/20 35-40%; b. Echo 03/01/20 EF 50-55%; c. Echo 03/17/20 EF 50%, gr2DD  . MI (myocardial infarction) ([place])     Tobacco Use: Social History   Tobacco Use  Smoking Status Never Smoker  Smokeless Tobacco Never Used    Labs: Recent Review Flowsheet Data    Labs for ITP Cardiac and Pulmonary Rehab Latest Ref Rng & Units 12/30/2018 03/01/2020 05/11/2020 06/17/2020   Cholestrol 0 - 200 mg/dL - 208(H) 238(H) 174   LDLCALC 0 - 99 mg/dL - 133(H) 101(H) 58   LDLDIRECT 0 - 99 mg/dL - - 126(H) -   HDL >40 mg/dL - 49 45 54   Trlycerides <150 mg/dL - 128 543(H) 312(H)   Hemoglobin A1c 4.8 - 5.6 % 7.7(H) 7.0(H) - -       Exercise Target Goals: Exercise Program Goal: Individual exercise prescription set using results from initial 6 min walk test and THRR while considering  patient's activity barriers and safety.   Exercise Prescription Goal: Initial exercise  prescription builds to 30-45 minutes a day of aerobic activity, 2-3 days per week.  Home exercise guidelines will be given to patient during program as part of exercise prescription that the participant will acknowledge.   Education: Aerobic Exercise: - Group verbal and visual presentation on the components of exercise prescription. Introduces F.I.T.T principle from ACSM for exercise prescriptions.  Reviews F.I.T.T. principles of aerobic exercise including progression. Written material given at graduation. Flowsheet Row Cardiac Rehab from 07/06/2020 in [facility] Cardiac and Pulmonary Rehab  Date 07/06/20  Educator  [facility]  Instruction Review Code 1- Verbalizes Understanding      Education: Resistance Exercise: - Group verbal and visual presentation on the components of exercise prescription. Introduces F.I.T.T principle from ACSM for exercise prescriptions  Reviews F.I.T.T. principles of resistance exercise including progression. Written material given at graduation.    Education: Exercise & Equipment Safety: - Individual verbal instruction and demonstration of equipment use and safety with use of the equipment. Flowsheet Row Cardiac Rehab from 07/06/2020 in [facility] Cardiac and Pulmonary Rehab  Date 04/14/20  Educator [place]  Instruction Review Code 1- Verbalizes Understanding      Education: Exercise Physiology & General Exercise Guidelines: - Group verbal and written instruction with models to review the exercise physiology of the cardiovascular system and associated critical values. Provides general exercise guidelines with specific guidelines to those with heart or lung disease.    Education: Flexibility, Balance, Mind/Body Relaxation: - Group verbal and visual presentation with interactive activity on the components of exercise prescription. Introduces F.I.T.T principle from ACSM for exercise prescriptions. Reviews F.I.T.T. principles of flexibility and balance exercise training including progression. Also discusses the mind body connection.  Reviews various relaxation techniques to help reduce and manage stress (i.e. Deep breathing, progressive muscle relaxation, and visualization). Balance handout provided to take home. Written material given at graduation.   Activity Barriers & Risk Stratification:  Activity Barriers & Cardiac Risk Stratification - 04/14/20 1715      Activity Barriers & Cardiac Risk Stratification   Activity Barriers Other (comment)    Comments left leg- rod from knee to ankle (May 2000), left knee pain    Cardiac Risk Stratification High           6 Minute Walk:  6 Minute  Walk    Row Name 04/14/20 1646         6 Minute Walk   Phase Initial     Distance 1553 feet     Walk Time 6 minutes     # of Rest Breaks 0     MPH 2.94     METS 4.38     RPE 11     Perceived Dyspnea  1     VO2 Peak 15.34     Symptoms Yes (comment)     Comments Left knee pain 2/10     Resting HR 84 bpm     Resting BP 122/86     Resting Oxygen Saturation  97 %     Exercise Oxygen Saturation  during 6 min walk 98 %     Max Ex. HR 133 bpm     Max Ex. BP 140/86     2 Minute Post BP 118/88            Oxygen Initial Assessment:   Oxygen Re-Evaluation:   Oxygen Discharge (Final Oxygen Re-Evaluation):   Initial Exercise Prescription:  Initial Exercise Prescription - 04/14/20 1700      Date of Initial Exercise RX and Referring  Provider   Date 04/14/20    Referring Provider Kathlyn Sacramento, MD      Treadmill   MPH 2.7    Grade 0.5    Minutes 15    METs 3.25      Recumbant Bike   Level 3    RPM 60    Minutes 15    METs 4.3      NuStep   Level 3    SPM 80    Minutes 15    METs 4.3      Arm Ergometer   Level 1    Watts --    RPM 30    Minutes 15    METs 4.3      REL-XR   Level 3    Speed 50    Minutes 15    METs 4.3      T5 Nustep   Level 2    SPM 80    Minutes 15    METs 4.3      Biostep-RELP   Level 3    SPM 50    Minutes 15    METs 4.3      Prescription Details   Frequency (times per week) 2    Duration Progress to 30 minutes of continuous aerobic without signs/symptoms of physical distress      Intensity   THRR 40-80% of Max Heartrate 117-151    Ratings of Perceived Exertion 11-13    Perceived Dyspnea 0-4      Progression   Progression Continue to progress workloads to maintain intensity without signs/symptoms of physical distress.      Resistance Training   Training Prescription Yes    Weight 4 lb    Reps 10-15           Perform Capillary Blood Glucose checks as needed.  Exercise Prescription Changes:  Exercise  Prescription Changes    Row Name 04/14/20 1600 04/14/20 1700 04/27/20 1500 05/09/20 1500 05/25/20 1600     Response to Exercise   Blood Pressure (Admit) 122/86 122/86 122/70 122/86 126/80   Blood Pressure (Exercise) 140/86 140/86 150/84 124/82 142/80   Blood Pressure (Exit) 118/88 118/88 126/70 122/70 122/82   Heart Rate (Admit) 84 bpm 84 bpm 92 bpm 99 bpm 98 bpm   Heart Rate (Exercise) 133 bpm 133 bpm 134 bpm 114 bpm 111 bpm   Heart Rate (Exit) 94 bpm 94 bpm 113 bpm 88 bpm 89 bpm   Oxygen Saturation (Admit) 97 % 97 % -- -- --   Oxygen Saturation (Exercise) 98 % 98 % -- -- --   Oxygen Saturation (Exit) 97 % 97 % -- -- --   Rating of Perceived Exertion (Exercise) 11 11 13 13 13    Perceived Dyspnea (Exercise) 1 1 -- -- --   Symptoms Left knee pain 2/10 Left knee pain 2/10 -- none none   Comments walk test results walk test results first day -- --   Duration Progress to 30 minutes of  aerobic without signs/symptoms of physical distress Progress to 30 minutes of  aerobic without signs/symptoms of physical distress -- Continue with 30 min of aerobic exercise without signs/symptoms of physical distress. Continue with 30 min of aerobic exercise without signs/symptoms of physical distress.   Intensity -- -- -- THRR unchanged THRR unchanged     Progression   Progression -- -- Continue to progress workloads to maintain intensity without signs/symptoms of physical distress. Continue to progress workloads to maintain intensity without signs/symptoms of  physical distress. Continue to progress workloads to maintain intensity without signs/symptoms of physical distress.   Average METs -- -- 2.4 3.02 2.6     Resistance Training   Training Prescription -- -- Yes Yes Yes   Weight 4 lb -- 4 lb 4 lb 4 lb   Reps 10-15 -- 10-15 10-15 10-15     Interval Training   Interval Training -- -- -- No No     Treadmill   MPH -- -- 2 2.7 --   Grade -- -- 0.5 0.5 --   Minutes -- -- 15 15 --   METs -- -- 2.67 3.25  --     NuStep   Level -- -- -- 3 3   SPM -- -- -- -- 80   Minutes -- -- -- 15 15   METs -- -- -- 2.8 2.2     T5 Nustep   Level -- -- 2 2 --   SPM -- -- 80 -- --   Minutes -- -- 15 15 --   METs -- -- 2.1 -- --     Biostep-RELP   Level -- -- -- 3 3   SPM -- -- -- -- 50   Minutes -- -- -- 15 15   METs -- -- -- 3 3   Row Name 06/03/20 1100 06/07/20 0900 06/21/20 0900 07/05/20 0800 07/18/20 1300     Response to Exercise   Blood Pressure (Admit) -- 134/70 142/80 122/80 120/78   Blood Pressure (Exercise) -- 138/70 148/82 136/82 128/74   Blood Pressure (Exit) -- 130/60 110/70 120/80 122/60   Heart Rate (Admit) -- 106 bpm 104 bpm 101 bpm 96 bpm   Heart Rate (Exercise) -- 131 bpm 145 bpm 122 bpm 147 bpm   Heart Rate (Exit) -- 119 bpm 109 bpm 97 bpm 115 bpm   Rating of Perceived Exertion (Exercise) -- $RemoveBefor'13 14 13 13   'UNUHQUDKUYAG$ Symptoms -- none none none none   Duration -- Continue with 30 min of aerobic exercise without signs/symptoms of physical distress. Continue with 30 min of aerobic exercise without signs/symptoms of physical distress. Continue with 30 min of aerobic exercise without signs/symptoms of physical distress. Continue with 30 min of aerobic exercise without signs/symptoms of physical distress.   Intensity -- THRR unchanged THRR unchanged THRR unchanged THRR unchanged     Progression   Progression -- Continue to progress workloads to maintain intensity without signs/symptoms of physical distress. Continue to progress workloads to maintain intensity without signs/symptoms of physical distress. Continue to progress workloads to maintain intensity without signs/symptoms of physical distress. Continue to progress workloads to maintain intensity without signs/symptoms of physical distress.   Average METs -- 2.9 3.7 3.63 2.7     Resistance Training   Training Prescription -- Yes Yes Yes Yes   Weight -- 4 lb 5 lb 5 lb 5 lb   Reps -- 10-15 10-15 10-15 10-15     Interval Training   Interval  Training -- No No No No     Treadmill   MPH -- 2.$Remove'7 3 3 'nttfdFP$ 2.7   Grade -- 0.$RemoveBe'5 2 2 'ucPNKzDNE$ 1.5   Minutes -- $RemoveBe'15 15 15 15   'UNoNeMzSU$ METs -- 3.25 4.12 4.12 3.63     Recumbant Bike   Level -- -- -- 3 --   Minutes -- -- -- 15 --     NuStep   Level -- -- -- 3 --   Minutes -- -- -- 15 --   METs -- -- --  4 --     REL-XR   Level -- -- -- -- 5   Speed -- -- -- -- 50   Minutes -- -- -- -- 15   METs -- -- -- -- 1.8     T5 Nustep   Level -- $Remove'2 3 3 'CgXIuXX$ --   SPM -- 80 80 -- --   Minutes -- $RemoveBe'15 15 15 'upCDnXfIC$ --   METs -- 2.5 3.4 3.4 --     Biostep-RELP   Level -- -- -- 3 --   Minutes -- -- -- 15 --   METs -- -- -- 3 --     Home Exercise Plan   Plans to continue exercise at Home (comment)  walking and staff videos Home (comment)  walking and staff videos -- Home (comment)  walking and staff videos Home (comment)  walking and staff videos   Frequency Add 2 additional days to program exercise sessions. Add 2 additional days to program exercise sessions. -- Add 2 additional days to program exercise sessions. Add 2 additional days to program exercise sessions.   Initial Home Exercises Provided 06/03/20 06/03/20 -- 06/03/20 06/03/20   Row Name 08/01/20 1300             Response to Exercise   Blood Pressure (Admit) 130/78       Blood Pressure (Exercise) 142/80       Blood Pressure (Exit) 124/60       Heart Rate (Admit) 106 bpm       Heart Rate (Exercise) 159 bpm       Heart Rate (Exit) 128 bpm       Rating of Perceived Exertion (Exercise) 14       Symptoms none       Duration Continue with 30 min of aerobic exercise without signs/symptoms of physical distress.       Intensity THRR unchanged               Progression   Progression Continue to progress workloads to maintain intensity without signs/symptoms of physical distress.       Average METs 3.03               Resistance Training   Training Prescription Yes       Weight 5 lb       Reps 10-15               Interval Training   Interval Training No                Treadmill   MPH 2.7       Grade 3       Minutes 15       METs 4.19               NuStep   Level 3       Minutes 15       METs 2.9               Elliptical   Level 1       Speed 2.5       Minutes 15       METs 2               Home Exercise Plan   Plans to continue exercise at Home (comment)  walking and staff videos       Frequency Add 2 additional days to program exercise sessions.       Initial Home Exercises Provided  06/03/20              Exercise Comments:  Exercise Comments    Row Name 07/15/20 6237           Exercise Comments Weight up 3 pounds- Jefferie told staff that he ate more than his usual and had pizza.              Exercise Goals and Review:  Exercise Goals    Row Name 04/14/20 1650             Exercise Goals   Increase Physical Activity Yes       Intervention Provide advice, education, support and counseling about physical activity/exercise needs.;Develop an individualized exercise prescription for aerobic and resistive training based on initial evaluation findings, risk stratification, comorbidities and participant's personal goals.       Expected Outcomes Short Term: Attend rehab on a regular basis to increase amount of physical activity.;Long Term: Add in home exercise to make exercise part of routine and to increase amount of physical activity.;Long Term: Exercising regularly at least 3-5 days a week.       Increase Strength and Stamina Yes       Intervention Provide advice, education, support and counseling about physical activity/exercise needs.;Develop an individualized exercise prescription for aerobic and resistive training based on initial evaluation findings, risk stratification, comorbidities and participant's personal goals.       Expected Outcomes Short Term: Increase workloads from initial exercise prescription for resistance, speed, and METs.;Short Term: Perform resistance training exercises routinely during rehab and add  in resistance training at home;Long Term: Improve cardiorespiratory fitness, muscular endurance and strength as measured by increased METs and functional capacity ( )       Able to understand and use rate of perceived exertion (RPE) scale Yes       Intervention Provide education and explanation on how to use RPE scale       Expected Outcomes Short Term: Able to use RPE daily in rehab to express subjective intensity level;Long Term:  Able to use RPE to guide intensity level when exercising independently       Able to understand and use Dyspnea scale Yes       Intervention Provide education and explanation on how to use Dyspnea scale       Expected Outcomes Short Term: Able to use Dyspnea scale daily in rehab to express subjective sense of shortness of breath during exertion;Long Term: Able to use Dyspnea scale to guide intensity level when exercising independently       Knowledge and understanding of Target Heart Rate Range (THRR) Yes       Intervention Provide education and explanation of THRR including how the numbers were predicted and where they are located for reference       Expected Outcomes Short Term: Able to state/look up THRR;Short Term: Able to use daily as guideline for intensity in rehab;Long Term: Able to use THRR to govern intensity when exercising independently       Able to check pulse independently Yes       Intervention Provide education and demonstration on how to check pulse in carotid and radial arteries.;Review the importance of being able to check your own pulse for safety during independent exercise       Expected Outcomes Short Term: Able to explain why pulse checking is important during independent exercise;Long Term: Able to check pulse independently and accurately       Understanding of Exercise Prescription  Yes       Intervention Provide education, explanation, and written materials on patient's individual exercise prescription       Expected Outcomes Short Term: Able  to explain program exercise prescription;Long Term: Able to explain home exercise prescription to exercise independently              Exercise Goals Re-Evaluation :  Exercise Goals Re-Evaluation    Row Name 04/18/20 1539 05/09/20 1558 05/25/20 1652 06/03/20 0901 06/07/20 0931     Exercise Goal Re-Evaluation   Exercise Goals Review Increase Physical Activity;Able to understand and use rate of perceived exertion (RPE) scale;Knowledge and understanding of Target Heart Rate Range (THRR);Understanding of Exercise Prescription;Increase Strength and Stamina;Able to check pulse independently Increase Physical Activity;Increase Strength and Stamina;Understanding of Exercise Prescription Increase Physical Activity;Increase Strength and Stamina;Understanding of Exercise Prescription Increase Physical Activity;Increase Strength and Stamina;Understanding of Exercise Prescription Increase Physical Activity;Increase Strength and Stamina;Understanding of Exercise Prescription   Comments Reviewed RPE and dyspnea scales, THR and program prescription with pt today.  Pt voiced understanding and was given a copy of goals to take home. Only attended twice since last review.  Pt currently in ED.  Will need negative test to return.  We will continue to montior his progress. Today is Jemario's first day back after being out for a while.  Staff will monitor progress. Gregorio has already started to walk on his other two days a week, so we talked about adding in a third day at home.  Reviewed home exercise with pt today.  Pt plans to walking at home for exercise.  Also gave access to our staff YouTube page for exercise and education videos.  Reviewed THR, pulse, RPE, sign and symptoms, pulse oximetery and when to call 911 or MD.  Also discussed weather considerations and indoor options.  Pt voiced understanding. Aarya attends consistentl and works in [employer] and RPE range.  Staff will monitor progress.   Expected Outcomes Short: Use RPE  daily to regulate intensity. Long: Follow program prescription in THR. Short: Improved attendance Long: Continue to follow program prescription. Short: attend consistently Long:  improve MET level Short: Start to add in a third day of exercise at home Long: Continue to improve stamina. Short: continue to exercise consistently Long:  increase MET level   Row Name 06/21/20 0912 07/05/20 0815 07/06/20 0801 07/18/20 1326 07/20/20 0755     Exercise Goal Re-Evaluation   Exercise Goals Review Increase Physical Activity;Increase Strength and Stamina;Understanding of Exercise Prescription Increase Physical Activity;Increase Strength and Stamina;Understanding of Exercise Prescription Increase Physical Activity;Increase Strength and Stamina;Understanding of Exercise Prescription -- Increase Physical Activity;Increase Strength and Stamina;Understanding of Exercise Prescription   Comments Khali has increased speed and grade on TM.  he is progressing well. Monique continues to do well in rehab.  He is now up to 4 METs on the NuStep.  We will conitnue to monitor his progress. Yazid has been doing well.  He enjoyed vacation last week and is now determined to get the weight back off.  He normally goes to the gym with his wife at least one extra day a week if not twice a week.  He is feeling stronger and has more stamina now then he did at the beginning. Emiel works in [employer] and RPE range when he attends.  As noted, he also works out on days not at [employer].  Staff will monitor progress. Jarmarcus is doing well in rehab.  His wife has been going to [employer], and  he hopes to join her there on occasion.  His weight is up today and he feels more tired.  Overall, he was doing better.   Expected Outcomes Short: continue to progress workloads Long:  improve stamina Short: Continue to have good attendance and increase workloads Long: Continue to improve stamina Short: Get back to exercise routine Long: Continue to improve stamina. Short:  exercise consistently Long: improve MET level Short: Get back to exercise at home Long; continue to improve stamina.   [place] Name 08/01/20 1258 08/29/20 1628           Exercise Goal Re-Evaluation   Exercise Goals Review Increase Physical Activity;Increase Strength and Stamina;Understanding of Exercise Prescription --      Comments Geovany has not attended since he came in so upset on 11/16.  The office notes him wanting to attend a 7 week rehabilitation program.  We are hoping to hear from him this week.  He had been doing well in rehab and was up 2.7 mph with 3% grade.  We will continue to monitor his progress. Out since last review      Expected Outcomes Short: Return to regular attendance Long: Continue to exercise independently. --             Discharge Exercise Prescription (Final Exercise Prescription Changes):  Exercise Prescription Changes - 08/01/20 1300      Response to Exercise   Blood Pressure (Admit) 130/78    Blood Pressure (Exercise) 142/80    Blood Pressure (Exit) 124/60    Heart Rate (Admit) 106 bpm    Heart Rate (Exercise) 159 bpm    Heart Rate (Exit) 128 bpm    Rating of Perceived Exertion (Exercise) 14    Symptoms none    Duration Continue with 30 min of aerobic exercise without signs/symptoms of physical distress.    Intensity THRR unchanged      Progression   Progression Continue to progress workloads to maintain intensity without signs/symptoms of physical distress.    Average METs 3.03      Resistance Training   Training Prescription Yes    Weight 5 lb    Reps 10-15      Interval Training   Interval Training No      Treadmill   MPH 2.7    Grade 3    Minutes 15    METs 4.19      NuStep   Level 3    Minutes 15    METs 2.9      Elliptical   Level 1    Speed 2.5    Minutes 15    METs 2      Home Exercise Plan   Plans to continue exercise at Home (comment)   walking and staff videos   Frequency Add 2 additional days to program exercise  sessions.    Initial Home Exercises Provided 06/03/20           Nutrition:  Target Goals: Understanding of nutrition guidelines, daily intake of sodium '1500mg'$ , cholesterol '200mg'$ , calories 30% from fat and 7% or less from saturated fats, daily to have 5 or more servings of fruits and vegetables.  Education: All About Nutrition: -Group instruction provided by verbal, written material, interactive activities, discussions, models, and posters to present general guidelines for heart healthy nutrition including fat, fiber, MyPlate, the role of sodium in heart healthy nutrition, utilization of the nutrition label, and utilization of this knowledge for meal planning. Follow up email sent as well. Written  material given at graduation.   Biometrics:  Pre Biometrics - 04/14/20 1640      Pre Biometrics   Height 6' 0.5" (1.842 m)    Weight 253 lb (114.8 kg)   Verbal from patient   BMI (Calculated) 33.82    Single Leg Stand 30 seconds            Nutrition Therapy Plan and Nutrition Goals:  Nutrition Therapy & Goals - 05/30/20 1535      Nutrition Therapy   Diet Heart healthy, low Na, Gout MNT    Drug/Food Interactions Purine/Gout    Protein (specify units) 95g    Fiber 30 grams    Whole Grain Foods 3 servings    Saturated Fats 12 max. grams    Fruits and Vegetables 5 servings/day    Sodium 1.5 grams      Personal Nutrition Goals   Nutrition Goal ST: reduce soda, add new meals, review paperwork LT: leanr how to eat healthier    Comments works 3rd shift - 5 one week and 2 the next week. B: bacon and eggs or cereal or nothing. sleep during the day. D: limits beef (has gout), has chicken and fish. Has pinto beans, black beans, green beans and lima beans, sweet potatoes,yellow squash, onions, zucchini, salad (bell peppers, cucumbers, carrots, onions). WIll cook eggs in butter, but will normally use olive oil. On sundays 1-2x/month will make fried chicken in vegetable oil. Drinks: pt reports  not drinking enough water. Drink 3-4 bottles of 18oz soda. Discussed heart healthy eating. Pt feels he struggles with meal planning - reviewed and provided paperwork.      Intervention Plan   Intervention Prescribe, educate and counsel regarding individualized specific dietary modifications aiming towards targeted core components such as weight, hypertension, lipid management, diabetes, heart failure and other comorbidities.;Nutrition handout(s) given to patient.    Expected Outcomes Short Term Goal: Understand basic principles of dietary content, such as calories, fat, sodium, cholesterol and nutrients.;Short Term Goal: A plan has been developed with personal nutrition goals set during dietitian appointment.;Long Term Goal: Adherence to prescribed nutrition plan.           Nutrition Assessments:  MEDIFICTS Score Key:  ?70 Need to make dietary changes   40-70 Heart Healthy Diet  ? 40 Therapeutic Level Cholesterol Diet   Picture Your Plate Scores:  <48 Unhealthy dietary pattern with much room for improvement.  41-50 Dietary pattern unlikely to meet recommendations for good health and room for improvement.  51-60 More healthful dietary pattern, with some room for improvement.   >60 Healthy dietary pattern, although there may be some specific behaviors that could be improved.    Nutrition Goals Re-Evaluation:  Nutrition Goals Re-Evaluation    [place] Name 07/06/20 0805 07/20/20 0756           Goals   Nutrition Goal ST: reduce soda, add new meals, review paperwork LT: leanr how to eat healthier ST: reduce soda, add new meals, review paperwork LT: leanr how to eat healthier      Comment Lynard was on vacation last week and got away from his diet.  He was previously doing better, but knows he needs to focus in on his diet given his diabetes.  He is determined to try to watch his sugar intake a little more closely. Wilkin has had pizza and spaghetti this week and his weight has gone up.   They usually use zuchinni noodles, but not this time.  He is going  to get back to it.  He has cut out the soda and not snacking.      Expected Outcome Short: Reduce sugar.  Long: Continue to eat better Short: Get back to diet Long: Continue to eat better.             Nutrition Goals Discharge (Final Nutrition Goals Re-Evaluation):  Nutrition Goals Re-Evaluation - 07/20/20 0756      Goals   Nutrition Goal ST: reduce soda, add new meals, review paperwork LT: leanr how to eat healthier    Comment Yuriy has had pizza and spaghetti this week and his weight has gone up.  They usually use zuchinni noodles, but not this time.  He is going to get back to it.  He has cut out the soda and not snacking.    Expected Outcome Short: Get back to diet Long: Continue to eat better.           Psychosocial: Target Goals: Acknowledge presence or absence of significant depression and/or stress, maximize coping skills, provide positive support system. Participant is able to verbalize types and ability to use techniques and skills needed for reducing stress and depression.   Education: Stress, Anxiety, and Depression - Group verbal and visual presentation to define topics covered.  Reviews how body is impacted by stress, anxiety, and depression.  Also discusses healthy ways to reduce stress and to treat/manage anxiety and depression.  Written material given at graduation.   Education: Sleep Hygiene -Provides group verbal and written instruction about how sleep can affect your health.  Define sleep hygiene, discuss sleep cycles and impact of sleep habits. Review good sleep hygiene tips.    Initial Review & Psychosocial Screening:  Initial Psych Review & Screening - 04/08/20 1440      Initial Review   Current issues with Current Stress Concerns;Current Sleep Concerns    Source of Stress Concerns Occupation      [place]? Yes   wife     Barriers   Psychosocial barriers to  participate in program There are no identifiable barriers or psychosocial needs.;The patient should benefit from training in stress management and relaxation.      Screening Interventions   Interventions Encouraged to exercise;To provide support and resources with identified psychosocial needs;Provide feedback about the scores to participant    Expected Outcomes Short Term goal: Utilizing psychosocial counselor, staff and physician to assist with identification of specific Stressors or current issues interfering with healing process. Setting desired goal for each stressor or current issue identified.;Long Term Goal: Stressors or current issues are controlled or eliminated.;Short Term goal: Identification and review with participant of any Quality of Life or Depression concerns found by scoring the questionnaire.;Long Term goal: The participant improves quality of Life and PHQ9 Scores as seen by post scores and/or verbalization of changes           Quality of Life Scores:   Quality of Life - 04/14/20 1639      Quality of Life   Select Quality of Life      Quality of Life Scores   Health/Function Pre 14.46 %    Socioeconomic Pre 20 %    Psych/Spiritual Pre 19.71 %    Family Pre 21.6 %    GLOBAL Pre 17.83 %          Scores of 19 and below usually indicate a poorer quality of life in these areas.  A difference of  2-3 points is  a clinically meaningful difference.  A difference of 2-3 points in the total score of the Quality of Life Index has been associated with significant improvement in overall quality of life, self-image, physical symptoms, and general health in studies assessing change in quality of life.  PHQ-9: Recent Review Flowsheet Data    Depression screen [facility] 2/9 05/25/2020 04/14/2020   Decreased Interest 0 1   Down, Depressed, Hopeless 0 1   PHQ - 2 Score 0 2   Altered sleeping 1 0   Tired, decreased energy 2 1   Change in appetite 0 1   Feeling bad or failure about yourself   0 1   Trouble concentrating 0 0   Moving slowly or fidgety/restless 0 0   Suicidal thoughts 0 0   PHQ-9 Score 3 5   Difficult doing work/chores Not difficult at all Somewhat difficult     Interpretation of Total Score  Total Score Depression Severity:  1-4 = Minimal depression, 5-9 = Mild depression, 10-14 = Moderate depression, 15-19 = Moderately severe depression, 20-27 = Severe depression   Psychosocial Evaluation and Intervention:  Psychosocial Evaluation - 04/08/20 1455      Psychosocial Evaluation & Interventions   Interventions Relaxation education;Stress management education;Encouraged to exercise with the program and follow exercise prescription    Comments Christian 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 He states he is a little nervous getting back into the night shift routine. His wife is helping him eat healthier and they both are looking forward to meeting with the dietician. His heart attack came as a surprise to him and he really wants to work hard to maintain a heart healthy lifestyle.    Expected Outcomes Short: attend cardiac rehab for education and exercise. Long; develop and maintain positive self care habits.    Continue Psychosocial Services  Follow up required by staff           Psychosocial Re-Evaluation:  Psychosocial Re-Evaluation    [place] Name 06/03/20 0902 07/06/20 0803 07/20/20 0800         Psychosocial Re-Evaluation   Current issues with Current Stress Concerns;Current Sleep Concerns Current Stress Concerns;Current Sleep Concerns Current Stress Concerns;Current Sleep Concerns     Comments Davy works 12 hour night shifts.  Thus he does not sleep well.  After a few shifts in a row, he sleeps very well.  He usually averages about 5-6 hours a night. His biggest stressor are his home life with step kids and his 36 year old daughter.  He is doing the best that he can. Tae continues to do the best he can with his sleep given his work  schedule.  He was able to sleep well last week while on vacation.  He enjoyed vacation and not really looking forward to getting back to work.  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  He is not feeling the best and hopes its just lingering effects.  Otherwise, things are doing okay at home. Knute is frustrated with his weight.  He also notes some SOB and he feels like a ticking time bomb.  He wishes he was better with his breathing.  He is doing okay with stress at work and he still likes working.  It does disrupt his sleep but he manages.     Expected Outcomes Short: Continue to sleep as best he can Long; Continue to focus on the positive.  Short; Continue to sleep when he can  Long: Stay positive Short: Continue to work on weight loss Long: Continue to stay positive.     Interventions Encouraged to attend Cardiac Rehabilitation for the exercise Encouraged to attend Cardiac Rehabilitation for the exercise Encouraged to attend Cardiac Rehabilitation for the exercise     Continue Psychosocial Services  Follow up required by staff Follow up required by staff Follow up required by staff            Psychosocial Discharge (Final Psychosocial Re-Evaluation):  Psychosocial Re-Evaluation - 07/20/20 0800      Psychosocial Re-Evaluation   Current issues with Current Stress Concerns;Current Sleep Concerns    Comments Aziz is frustrated with his weight.  He also notes some SOB and he feels like a ticking time bomb.  He wishes he was better with his breathing.  He is doing okay with stress at work and he still likes working.  It does disrupt his sleep but he manages.    Expected Outcomes Short: Continue to work on weight loss Long: Continue to stay positive.    Interventions Encouraged to attend Cardiac Rehabilitation for the exercise    Continue Psychosocial Services  Follow up required by staff           Vocational Rehabilitation: Provide  vocational rehab assistance to qualifying candidates.   Vocational Rehab Evaluation & Intervention:  Vocational Rehab - 04/08/20 1440      Initial Vocational Rehab Evaluation & Intervention   Assessment shows need for Vocational Rehabilitation No           Education: Education Goals: Education classes will be provided on a variety of topics geared toward better understanding of heart health and risk factor modification. Participant will state understanding/return demonstration of topics presented as noted by education test scores.  Learning Barriers/Preferences:  Learning Barriers/Preferences - 04/08/20 1440      Learning Barriers/Preferences   Learning Barriers None    Learning Preferences None           General Cardiac Education Topics:  AED/CPR: - Group verbal and written instruction with the use of models to demonstrate the basic use of the AED with the basic ABC's of resuscitation.   Anatomy and Cardiac Procedures: - Group verbal and visual presentation and models provide information about basic cardiac anatomy and function. Reviews the testing methods done to diagnose heart disease and the outcomes of the test results. Describes the treatment choices: Medical Management, Angioplasty, or Coronary Bypass Surgery for treating various heart conditions including Myocardial Infarction, Angina, Valve Disease, and Cardiac Arrhythmias.  Written material given at graduation.   Medication Safety: - Group verbal and visual instruction to review commonly prescribed medications for heart and lung disease. Reviews the medication, class of the drug, and side effects. Includes the steps to properly store meds and maintain the prescription regimen.  Written material given at graduation. Flowsheet Row Cardiac Rehab from 07/06/2020 in [facility] Cardiac and Pulmonary Rehab  Date 05/25/20  Educator SB  Instruction Review Code 1- Verbalizes Understanding      Intimacy: - Group verbal  instruction through game format to discuss how heart and lung disease can affect sexual intimacy. Written material given at graduation.. Flowsheet Row Cardiac Rehab from 07/06/2020 in [facility] Cardiac and Pulmonary Rehab  Date 04/27/20  Educator AS  Instruction Review Code 1- Verbalizes Understanding      Know Your Numbers and Heart Failure: - Group verbal and visual instruction to discuss disease risk factors for  cardiac and pulmonary disease and treatment options.  Reviews associated critical values for Overweight/Obesity, Hypertension, Cholesterol, and Diabetes.  Discusses basics of heart failure: signs/symptoms and treatments.  Introduces Heart Failure Zone chart for action plan for heart failure.  Written material given at graduation.   Infection Prevention: - Provides verbal and written material to individual with discussion of infection control including proper hand washing and proper equipment cleaning during exercise session. Flowsheet Row Cardiac Rehab from 07/06/2020 in [facility] Cardiac and Pulmonary Rehab  Date 04/14/20  Educator [place]  Instruction Review Code 1- Verbalizes Understanding      Falls Prevention: - Provides verbal and written material to individual with discussion of falls prevention and safety. Flowsheet Row Cardiac Rehab from 07/06/2020 in [facility] Cardiac and Pulmonary Rehab  Date 04/14/20  Educator [place]  Instruction Review Code 1- Verbalizes Understanding      Other: -Provides group and verbal instruction on various topics (see comments)   Knowledge Questionnaire Score:  Knowledge Questionnaire Score - 04/14/20 1633      Knowledge Questionnaire Score   Pre Score 20/26: A&P, Angina, Nutrition, Exercise           Core Components/Risk Factors/Patient Goals at Admission:  Personal Goals and Risk Factors at Admission - 04/14/20 1652      Core Components/Risk Factors/Patient Goals on Admission    Weight Management Yes;Weight Loss    Intervention Weight  Management: Develop a combined nutrition and exercise program designed to reach desired caloric intake, while maintaining appropriate intake of nutrient and fiber, sodium and fats, and appropriate energy expenditure required for the weight goal.;Weight Management: Provide education and appropriate resources to help participant work on and attain dietary goals.;Weight Management/Obesity: Establish reasonable short term and long term weight goals.    Admit Weight 253 lb (114.8 kg)    Goal Weight: Short Term 248 lb (112.5 kg)    Goal Weight: Long Term 243 lb (110.2 kg)    Expected Outcomes Short Term: Continue to assess and modify interventions until short term weight is achieved;Long Term: Adherence to nutrition and physical activity/exercise program aimed toward attainment of established weight goal;Weight Loss: Understanding of general recommendations for a balanced deficit meal plan, which promotes 1-2 lb weight loss per week and includes a negative energy balance of [phone] kcal/d;Understanding recommendations for meals to include 15-35% energy as protein, 25-35% energy from fat, 35-60% energy from carbohydrates, less than $RemoveB'200mg'[id number]$  of dietary cholesterol, 20-35 gm of total fiber daily;Understanding of distribution of calorie intake throughout the day with the consumption of 4-5 meals/snacks    Diabetes Yes    Intervention Provide education about signs/symptoms and action to take for hypo/hyperglycemia.;Provide education about proper nutrition, including hydration, and aerobic/resistive exercise prescription along with prescribed medications to achieve blood glucose in normal ranges: Fasting glucose 65-99 mg/dL    Expected Outcomes Short Term: Participant verbalizes understanding of the signs/symptoms and immediate care of hyper/hypoglycemia, proper foot care and importance of medication, aerobic/resistive exercise and nutrition plan for blood glucose control.;Long Term: Attainment of HbA1C < 7%.     Hypertension Yes    Intervention Provide education on lifestyle modifcations including regular physical activity/exercise, weight management, moderate sodium restriction and increased consumption of fresh fruit, vegetables, and low fat dairy, alcohol moderation, and smoking cessation.;Monitor prescription use compliance.    Expected Outcomes Short Term: Continued assessment and intervention until BP is < 140/44mm HG in hypertensive participants. < 130/79mm HG in hypertensive participants with diabetes, heart failure or chronic kidney disease.;Long Term: Maintenance of  blood pressure at goal levels.           Education:Diabetes - Individual verbal and written instruction to review signs/symptoms of diabetes, desired ranges of glucose level fasting, after meals and with exercise. Acknowledge that pre and post exercise glucose checks will be done for 3 sessions at entry of program. [place] from 07/06/2020 in [facility] Cardiac and Pulmonary Rehab  Date 04/14/20  Educator [place]  Instruction Review Code 1- Verbalizes Understanding      Core Components/Risk Factors/Patient Goals Review:   Goals and Risk Factor Review    Row Name 06/03/20 0906 07/06/20 0806 07/20/20 0758         Core Components/Risk Factors/Patient Goals Review   Personal Goals Review Weight Management/Obesity;Diabetes;Hypertension;Lipids Weight Management/Obesity;Diabetes;Hypertension;Lipids Weight Management/Obesity;Diabetes;Hypertension;Lipids     Review Tamarion is doing well in rehab. He is working on weight loss and his weight has been yoyoing. However, he is trending down again.  His pressures have been good and he checks them at home.  His sugars have been in the 170s but he was not eating well and is trying to get back to it. Freman did not gain too much weight on vacation (only about 1-2 lbs) which made his wife mad as she gained 5-8 lb.  He is working to get it back down now that they are back.  His blood  pressures have continued to do well.  However, his sugars continue to stay elevated around the 180s.  He knows this is tied into his diet and he is going to work on it. Ilija's weight is up today.  He is trying to work on his diet, but cheated some this week.  He is taking more salads to work to help.  His blood sugars are doing pretty good and usually staying around 180 still.  Blood pressures continue to do well and he is off his med for it.     Expected Outcomes Short: Work on weight loss Long; Conitnue to monitor risk factors. Short; Reduce sugar and improve diabetes management Long; Conitnue to monitor risk factors. Short: Get back to diet and work on weight loss Long: COntinue to lose weight.            Core Components/Risk Factors/Patient Goals at Discharge (Final Review):   Goals and Risk Factor Review - 07/20/20 0758      Core Components/Risk Factors/Patient Goals Review   Personal Goals Review Weight Management/Obesity;Diabetes;Hypertension;Lipids    Review Yaasir's weight is up today.  He is trying to work on his diet, but cheated some this week.  He is taking more salads to work to help.  His blood sugars are doing pretty good and usually staying around 180 still.  Blood pressures continue to do well and he is off his med for it.    Expected Outcomes Short: Get back to diet and work on weight loss Long: COntinue to lose weight.           ITP Comments:  ITP Comments    Row Name 04/08/20 1450 04/14/20 1645 04/18/20 1538 04/20/20 0734 05/09/20 1557   ITP Comments Initial telephone orientation completed. Diagnosis can be found in [facility] 6/21. EP orienation scheduled for Thursday, 8/5 at 2:30 Completed 6MWT and gym orientation. Initial ITP created and sent for review to Dr. Emily Filbert, Medical Director. First full day of exercise!  Patient was oriented to gym and equipment including functions, settings, policies, and procedures.  Patient's individual exercise prescription and treatment plan  were reviewed.  All starting workloads were established based on the results of the 6 minute walk test done at initial orientation visit.  The plan for exercise progression was also introduced and progression will be customized based on patient's performance and goals. 30 Day review completed. Medical Director ITP review done, changes made as directed, and signed approval by Medical Director. Pt currently (8/30) in ED for COVID symptoms/chest tightness.  Tested negative on 8/24.   Row Name 05/18/20 1644 06/15/20 0637 07/13/20 0726 07/15/20 0826 08/01/20 1300   ITP Comments 30 day review completed. ITP sent to Dr. Emily Filbert, Medical Director of Cardiac and Pulmonary Rehab. Continue with ITP unless changes are made by physician. 30 Day review completed. Medical Director ITP review done, changes made as directed, and signed approval by Medical Director. 30 Day review completed. Medical Director ITP review done, changes made as directed, and signed approval by Medical Director. Weight up 3 pounds- Mitsuru told staff that he ate more than his usual and had pizza. [place] has not attended since he came in so upset on 11/16.  The office notes show him wanting to attend a 7 week rehabilitation program.  We are hoping to hear from him this week.   [place] Name 08/10/20 1053 08/29/20 1628 09/07/20 0728       ITP Comments 30 Day review completed. Medical Director ITP review done, changes made as directed, and signed approval by Medical Director. 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 Medical Director ITP review done, changes made as directed, and signed approval by Medical Director.            Comments:

## 2020-09-08 ENCOUNTER — Encounter: Payer: Self-pay | Admitting: *Deleted

## 2020-09-08 NOTE — Progress Notes (Signed)
Discharge Progress Report  Patient Details  Name: Joshua Holder MRN: [id number] Date of Birth: 01/09/1966 Referring Provider:   Flowsheet Row Cardiac Rehab from 04/14/2020 in [facility]  Referring Provider Lorine Bears, MD       Number of Visits: 20  Reason for Discharge:  Early Exit:  Personal- Rehab facility   Smoking History:  Social History   Tobacco Use  Smoking Status Never Smoker  Smokeless Tobacco Never Used    Diagnosis:  No diagnosis found.  ADL UCSD:   Initial Exercise Prescription:  Initial Exercise Prescription - 04/14/20 1700      Date of Initial Exercise RX and Referring Provider   Date 04/14/20    Referring Provider Lorine Bears, MD      Treadmill   MPH 2.7    Grade 0.5    Minutes 15    METs 3.25      Recumbant Bike   Level 3    RPM 60    Minutes 15    METs 4.3      NuStep   Level 3    SPM 80    Minutes 15    METs 4.3      Arm Ergometer   Level 1    Watts --    RPM 30    Minutes 15    METs 4.3      REL-XR   Level 3    Speed 50    Minutes 15    METs 4.3      T5 Nustep   Level 2    SPM 80    Minutes 15    METs 4.3      Biostep-RELP   Level 3    SPM 50    Minutes 15    METs 4.3      Prescription Details   Frequency (times per week) 2    Duration Progress to 30 minutes of continuous aerobic without signs/symptoms of physical distress      Intensity   THRR 40-80% of Max Heartrate 117-151    Ratings of Perceived Exertion 11-13    Perceived Dyspnea 0-4      Progression   Progression Continue to progress workloads to maintain intensity without signs/symptoms of physical distress.      Resistance Training   Training Prescription Yes    Weight 4 lb    Reps 10-15           Discharge Exercise Prescription (Final Exercise Prescription Changes):  Exercise Prescription Changes - 08/01/20 1300      Response to Exercise   Blood Pressure (Admit) 130/78    Blood Pressure (Exercise)  142/80    Blood Pressure (Exit) 124/60    Heart Rate (Admit) 106 bpm    Heart Rate (Exercise) 159 bpm    Heart Rate (Exit) 128 bpm    Rating of Perceived Exertion (Exercise) 14    Symptoms none    Duration Continue with 30 min of aerobic exercise without signs/symptoms of physical distress.    Intensity THRR unchanged      Progression   Progression Continue to progress workloads to maintain intensity without signs/symptoms of physical distress.    Average METs 3.03      Resistance Training   Training Prescription Yes    Weight 5 lb    Reps 10-15      Interval Training   Interval Training No      Treadmill   MPH 2.7  Grade 3    Minutes 15    METs 4.19      NuStep   Level 3    Minutes 15    METs 2.9      Elliptical   Level 1    Speed 2.5    Minutes 15    METs 2      Home Exercise Plan   Plans to continue exercise at Home (comment)   walking and staff videos   Frequency Add 2 additional days to program exercise sessions.    Initial Home Exercises Provided 06/03/20           Functional Capacity:  6 Minute Walk    Row Name 04/14/20 1646         6 Minute Walk   Phase Initial     Distance 1553 feet     Walk Time 6 minutes     # of Rest Breaks 0     MPH 2.94     METS 4.38     RPE 11     Perceived Dyspnea  1     VO2 Peak 15.34     Symptoms Yes (comment)     Comments Left knee pain 2/10     Resting HR 84 bpm     Resting BP 122/86     Resting Oxygen Saturation  97 %     Exercise Oxygen Saturation  during 6 min walk 98 %     Max Ex. HR 133 bpm     Max Ex. BP 140/86     2 Minute Post BP 118/88            Psychological, QOL, Others - Outcomes: PHQ 2/9: Depression screen [facility] 2/9 05/25/2020 04/14/2020  Decreased Interest 0 1  Down, Depressed, Hopeless 0 1  PHQ - 2 Score 0 2  Altered sleeping 1 0  Tired, decreased energy 2 1  Change in appetite 0 1  Feeling bad or failure about yourself  0 1  Trouble concentrating 0 0  Moving slowly or  fidgety/restless 0 0  Suicidal thoughts 0 0  PHQ-9 Score 3 5  Difficult doing work/chores Not difficult at all Somewhat difficult    Quality of Life:  Quality of Life - 04/14/20 1639      Quality of Life   Select Quality of Life      Quality of Life Scores   Health/Function Pre 14.46 %    Socioeconomic Pre 20 %    Psych/Spiritual Pre 19.71 %    Family Pre 21.6 %    GLOBAL Pre 17.83 %          Nutrition & Weight - Outcomes:  Pre Biometrics - 04/14/20 1640      Pre Biometrics   Height 6' 0.5" (1.842 m)    Weight 253 lb (114.8 kg)   Verbal from patient   BMI (Calculated) 33.82    Single Leg Stand 30 seconds            Nutrition:  Nutrition Therapy & Goals - 05/30/20 1535      Nutrition Therapy   Diet Heart healthy, low Na, Gout MNT    Drug/Food Interactions Purine/Gout    Protein (specify units) 95g    Fiber 30 grams    Whole Grain Foods 3 servings    Saturated Fats 12 max. grams    Fruits and Vegetables 5 servings/day    Sodium 1.5 grams      Personal Nutrition Goals  Nutrition Goal ST: reduce soda, add new meals, review paperwork LT: leanr how to eat healthier    Comments works 3rd shift - 5 one week and 2 the next week. B: bacon and eggs or cereal or nothing. sleep during the day. D: limits beef (has gout), has chicken and fish. Has pinto beans, black beans, green beans and lima beans, sweet potatoes,yellow squash, onions, zucchini, salad (bell peppers, cucumbers, carrots, onions). WIll cook eggs in butter, but will normally use olive oil. On sundays 1-2x/month will make fried chicken in vegetable oil. Drinks: pt reports not drinking enough water. Drink 3-4 bottles of 18oz soda. Discussed heart healthy eating. Pt feels he struggles with meal planning - reviewed and provided paperwork.      Intervention Plan   Intervention Prescribe, educate and counsel regarding individualized specific dietary modifications aiming towards targeted core components such as weight,  hypertension, lipid management, diabetes, heart failure and other comorbidities.;Nutrition handout(s) given to patient.    Expected Outcomes Short Term Goal: Understand basic principles of dietary content, such as calories, fat, sodium, cholesterol and nutrients.;Short Term Goal: A plan has been developed with personal nutrition goals set during dietitian appointment.;Long Term Goal: Adherence to prescribed nutrition plan.           Nutrition Discharge:   Education Questionnaire Score:  Knowledge Questionnaire Score - 04/14/20 1633      Knowledge Questionnaire Score   Pre Score 20/26: A&P, Angina, Nutrition, Exercise           Goals reviewed with patient; copy given to patient.

## 2020-09-08 NOTE — Progress Notes (Signed)
Cardiac Individual Treatment Plan  Patient Details  Name: Joshua Holder MRN: [id number] Date of Birth: Aug 02, 1966 Referring Provider:   Flowsheet Row Cardiac Rehab from 04/14/2020 in [facility] Cardiac and Pulmonary Rehab  Referring Provider Kathlyn Sacramento, MD      Initial Encounter Date:  Flowsheet Row Cardiac Rehab from 04/14/2020 in [facility] Cardiac and Pulmonary Rehab  Date 04/14/20      Visit Diagnosis: No diagnosis found.  Patient's Home Medications on Admission:  Current Outpatient Medications:  .  aspirin EC 81 MG tablet, Take 1 tablet (81 mg) by mouth once daily. Swallow whole., Disp: , Rfl:  .  atorvastatin (LIPITOR) 80 MG tablet, Take 1 tablet (80 mg total) by mouth daily., Disp: 90 tablet, Rfl: 1 .  icosapent Ethyl (VASCEPA) 1 g capsule, Take 2 capsules (2 g total) by mouth 2 (two) times daily., Disp: 360 capsule, Rfl: 1 .  Magnesium Oxide 400 MG CAPS, Take 1 capsule (400 mg total) by mouth daily. (Patient taking differently: Take 400 mg by mouth at bedtime.), Disp: , Rfl: 0 .  metFORMIN (GLUCOPHAGE-XR) 500 MG 24 hr tablet, Take 1,000 mg by mouth daily with breakfast., Disp: , Rfl:  .  metoprolol tartrate (LOPRESSOR) 25 MG tablet, Take 1 tablet (25 mg total) by mouth 2 (two) times daily., Disp: 180 tablet, Rfl: 0 .  Multiple Vitamins-Minerals (CENTRUM SILVER 50+MEN PO), Take 1 tablet by mouth daily., Disp: , Rfl:  .  nitroGLYCERIN (NITROSTAT) 0.4 MG SL tablet, Place 1 tablet (0.4 mg total) under the tongue every 5 (five) minutes as needed for chest pain., Disp: 30 tablet, Rfl: 0 .  ticagrelor (BRILINTA) 90 MG TABS tablet, Take 1 tablet (90 mg total) by mouth 2 (two) times daily., Disp: 180 tablet, Rfl: 1  Past Medical History: Past Medical History:  Diagnosis Date  . Atrial fibrillation with RVR ([place]) 02/29/2020   a. <48 hours in setting of inferior STEMI  . Atrial flutter ([place])    a. 03/2020 - converted w/ IV dilt in ED; b. 03/2020 Zio: no recurrent Afib/flutter.  . Combined  systolic and diastolic heart failure ([place]) 02/29/2020  . Coronary artery disease 02/29/2020   a. 02/29/20 late presenting inferior STEMI thrombotic occlusion RCA with L-R collaterals. PTCA attempted but unsuccessful-->Med Rx.  . Diabetes 1.5, managed as type 2 ([place])   . Gout   . HLD (hyperlipidemia) 02/29/2020  . Hypertension   . Ischemic cardiomyopathy 02/29/2020   a. EF during cath 03/01/20 35-40%; b. Echo 03/01/20 EF 50-55%; c. Echo 03/17/20 EF 50%, gr2DD  . MI (myocardial infarction) ([place])     Tobacco Use: Social History   Tobacco Use  Smoking Status Never Smoker  Smokeless Tobacco Never Used    Labs: Recent Review Flowsheet Data    Labs for ITP Cardiac and Pulmonary Rehab Latest Ref Rng & Units 12/30/2018 03/01/2020 05/11/2020 06/17/2020   Cholestrol 0 - 200 mg/dL - 208(H) 238(H) 174   LDLCALC 0 - 99 mg/dL - 133(H) 101(H) 58   LDLDIRECT 0 - 99 mg/dL - - 126(H) -   HDL >40 mg/dL - 49 45 54   Trlycerides <150 mg/dL - 128 543(H) 312(H)   Hemoglobin A1c 4.8 - 5.6 % 7.7(H) 7.0(H) - -       Exercise Target Goals: Exercise Program Goal: Individual exercise prescription set using results from initial 6 min walk test and THRR while considering  patient's activity barriers and safety.   Exercise Prescription Goal: Initial exercise prescription builds to 30-45 minutes  a day of aerobic activity, 2-3 days per week.  Home exercise guidelines will be given to patient during program as part of exercise prescription that the participant will acknowledge.   Education: Aerobic Exercise: - Group verbal and visual presentation on the components of exercise prescription. Introduces F.I.T.T principle from ACSM for exercise prescriptions.  Reviews F.I.T.T. principles of aerobic exercise including progression. Written material given at graduation. Flowsheet Row Cardiac Rehab from 07/06/2020 in [facility] Cardiac and Pulmonary Rehab  Date 07/06/20  Educator [facility]  Instruction Review Code 1- Verbalizes  Understanding      Education: Resistance Exercise: - Group verbal and visual presentation on the components of exercise prescription. Introduces F.I.T.T principle from ACSM for exercise prescriptions  Reviews F.I.T.T. principles of resistance exercise including progression. Written material given at graduation.    Education: Exercise & Equipment Safety: - Individual verbal instruction and demonstration of equipment use and safety with use of the equipment. Flowsheet Row Cardiac Rehab from 07/06/2020 in [facility] Cardiac and Pulmonary Rehab  Date 04/14/20  Educator [place]  Instruction Review Code 1- Verbalizes Understanding      Education: Exercise Physiology & General Exercise Guidelines: - Group verbal and written instruction with models to review the exercise physiology of the cardiovascular system and associated critical values. Provides general exercise guidelines with specific guidelines to those with heart or lung disease.    Education: Flexibility, Balance, Mind/Body Relaxation: - Group verbal and visual presentation with interactive activity on the components of exercise prescription. Introduces F.I.T.T principle from ACSM for exercise prescriptions. Reviews F.I.T.T. principles of flexibility and balance exercise training including progression. Also discusses the mind body connection.  Reviews various relaxation techniques to help reduce and manage stress (i.e. Deep breathing, progressive muscle relaxation, and visualization). Balance handout provided to take home. Written material given at graduation.   Activity Barriers & Risk Stratification:  Activity Barriers & Cardiac Risk Stratification - 04/14/20 1715      Activity Barriers & Cardiac Risk Stratification   Activity Barriers Other (comment)    Comments left leg- rod from knee to ankle (May 2000), left knee pain    Cardiac Risk Stratification High           6 Minute Walk:  6 Minute Walk    Row Name 04/14/20 1646          6 Minute Walk   Phase Initial     Distance 1553 feet     Walk Time 6 minutes     # of Rest Breaks 0     MPH 2.94     METS 4.38     RPE 11     Perceived Dyspnea  1     VO2 Peak 15.34     Symptoms Yes (comment)     Comments Left knee pain 2/10     Resting HR 84 bpm     Resting BP 122/86     Resting Oxygen Saturation  97 %     Exercise Oxygen Saturation  during 6 min walk 98 %     Max Ex. HR 133 bpm     Max Ex. BP 140/86     2 Minute Post BP 118/88            Oxygen Initial Assessment:   Oxygen Re-Evaluation:   Oxygen Discharge (Final Oxygen Re-Evaluation):   Initial Exercise Prescription:  Initial Exercise Prescription - 04/14/20 1700      Date of Initial Exercise RX and Referring Provider   Date 04/14/20  Referring Provider Kathlyn Sacramento, MD      Treadmill   MPH 2.7    Grade 0.5    Minutes 15    METs 3.25      Recumbant Bike   Level 3    RPM 60    Minutes 15    METs 4.3      NuStep   Level 3    SPM 80    Minutes 15    METs 4.3      Arm Ergometer   Level 1    Watts --    RPM 30    Minutes 15    METs 4.3      REL-XR   Level 3    Speed 50    Minutes 15    METs 4.3      T5 Nustep   Level 2    SPM 80    Minutes 15    METs 4.3      Biostep-RELP   Level 3    SPM 50    Minutes 15    METs 4.3      Prescription Details   Frequency (times per week) 2    Duration Progress to 30 minutes of continuous aerobic without signs/symptoms of physical distress      Intensity   THRR 40-80% of Max Heartrate 117-151    Ratings of Perceived Exertion 11-13    Perceived Dyspnea 0-4      Progression   Progression Continue to progress workloads to maintain intensity without signs/symptoms of physical distress.      Resistance Training   Training Prescription Yes    Weight 4 lb    Reps 10-15           Perform Capillary Blood Glucose checks as needed.  Exercise Prescription Changes:  Exercise Prescription Changes    Row Name 04/14/20 1600  04/14/20 1700 04/27/20 1500 05/09/20 1500 05/25/20 1600     Response to Exercise   Blood Pressure (Admit) 122/86 122/86 122/70 122/86 126/80   Blood Pressure (Exercise) 140/86 140/86 150/84 124/82 142/80   Blood Pressure (Exit) 118/88 118/88 126/70 122/70 122/82   Heart Rate (Admit) 84 bpm 84 bpm 92 bpm 99 bpm 98 bpm   Heart Rate (Exercise) 133 bpm 133 bpm 134 bpm 114 bpm 111 bpm   Heart Rate (Exit) 94 bpm 94 bpm 113 bpm 88 bpm 89 bpm   Oxygen Saturation (Admit) 97 % 97 % -- -- --   Oxygen Saturation (Exercise) 98 % 98 % -- -- --   Oxygen Saturation (Exit) 97 % 97 % -- -- --   Rating of Perceived Exertion (Exercise) _0 Perceived Dyspnea (Exercise) 1 1 -- -- --   Symptoms Left knee pain 2/10 Left knee pain 2/10 -- none none   Comments walk test results walk test results first day -- --   Duration Progress to 30 minutes of  aerobic without signs/symptoms of physical distress Progress to 30 minutes of  aerobic without signs/symptoms of physical distress -- Continue with 30 min of aerobic exercise without signs/symptoms of physical distress. Continue with 30 min of aerobic exercise without signs/symptoms of physical distress.   Intensity -- -- -- THRR unchanged THRR unchanged     Progression   Progression -- -- Continue to progress workloads to maintain intensity without signs/symptoms of physical distress. Continue to progress workloads to maintain intensity without signs/symptoms of physical distress. Continue to progress workloads to maintain  intensity without signs/symptoms of physical distress.   Average METs -- -- 2.4 3.02 2.6     Resistance Training   Training Prescription -- -- Yes Yes Yes   Weight 4 lb -- 4 lb 4 lb 4 lb   Reps 10-15 -- 10-15 10-15 10-15     Interval Training   Interval Training -- -- -- No No     Treadmill   MPH -- -- 2 2.7 --   Grade -- -- 0.5 0.5 --   Minutes -- -- 15 15 --   METs -- -- 2.67 3.25 --     NuStep   Level -- -- -- 3 3   SPM --  -- -- -- 80   Minutes -- -- -- 15 15   METs -- -- -- 2.8 2.2     T5 Nustep   Level -- -- 2 2 --   SPM -- -- 80 -- --   Minutes -- -- 15 15 --   METs -- -- 2.1 -- --     Biostep-RELP   Level -- -- -- 3 3   SPM -- -- -- -- 50   Minutes -- -- -- 15 15   METs -- -- -- 3 3   Row Name 06/03/20 1100 06/07/20 0900 06/21/20 0900 07/05/20 0800 07/18/20 1300     Response to Exercise   Blood Pressure (Admit) -- 134/70 142/80 122/80 120/78   Blood Pressure (Exercise) -- 138/70 148/82 136/82 128/74   Blood Pressure (Exit) -- 130/60 110/70 120/80 122/60   Heart Rate (Admit) -- 106 bpm 104 bpm 101 bpm 96 bpm   Heart Rate (Exercise) -- 131 bpm 145 bpm 122 bpm 147 bpm   Heart Rate (Exit) -- 119 bpm 109 bpm 97 bpm 115 bpm   Rating of Perceived Exertion (Exercise) -- _0 Symptoms -- none none none none   Duration -- Continue with 30 min of aerobic exercise without signs/symptoms of physical distress. Continue with 30 min of aerobic exercise without signs/symptoms of physical distress. Continue with 30 min of aerobic exercise without signs/symptoms of physical distress. Continue with 30 min of aerobic exercise without signs/symptoms of physical distress.   Intensity -- THRR unchanged THRR unchanged THRR unchanged THRR unchanged     Progression   Progression -- Continue to progress workloads to maintain intensity without signs/symptoms of physical distress. Continue to progress workloads to maintain intensity without signs/symptoms of physical distress. Continue to progress workloads to maintain intensity without signs/symptoms of physical distress. Continue to progress workloads to maintain intensity without signs/symptoms of physical distress.   Average METs -- 2.9 3.7 3.63 2.7     Resistance Training   Training Prescription -- Yes Yes Yes Yes   Weight -- 4 lb 5 lb 5 lb 5 lb   Reps -- 10-15 10-15 10-15 10-15     Interval Training   Interval Training -- No No No No     Treadmill   MPH  -- 2._1 2.7   Grade -- 0._2 1.5   Minutes -- _3 METs -- 3.25 4.12 4.12 3.63     Recumbant Bike   Level -- -- -- 3 --   Minutes -- -- -- 15 --     NuStep   Level -- -- -- 3 --   Minutes -- -- -- 15 --   METs -- -- -- 4 --  REL-XR   Level -- -- -- -- 5   Speed -- -- -- -- 50   Minutes -- -- -- -- 15   METs -- -- -- -- 1.8     T5 Nustep   Level -- _0 --   SPM -- 80 80 -- --   Minutes -- _1 --   METs -- 2.5 3.4 3.4 --     Biostep-RELP   Level -- -- -- 3 --   Minutes -- -- -- 15 --   METs -- -- -- 3 --     Home Exercise Plan   Plans to continue exercise at Home (comment)  walking and staff videos Home (comment)  walking and staff videos -- Home (comment)  walking and staff videos Home (comment)  walking and staff videos   Frequency Add 2 additional days to program exercise sessions. Add 2 additional days to program exercise sessions. -- Add 2 additional days to program exercise sessions. Add 2 additional days to program exercise sessions.   Initial Home Exercises Provided 06/03/20 06/03/20 -- 06/03/20 06/03/20   Row Name 08/01/20 1300             Response to Exercise   Blood Pressure (Admit) 130/78       Blood Pressure (Exercise) 142/80       Blood Pressure (Exit) 124/60       Heart Rate (Admit) 106 bpm       Heart Rate (Exercise) 159 bpm       Heart Rate (Exit) 128 bpm       Rating of Perceived Exertion (Exercise) 14       Symptoms none       Duration Continue with 30 min of aerobic exercise without signs/symptoms of physical distress.       Intensity THRR unchanged               Progression   Progression Continue to progress workloads to maintain intensity without signs/symptoms of physical distress.       Average METs 3.03               Resistance Training   Training Prescription Yes       Weight 5 lb       Reps 10-15               Interval Training   Interval Training No               Treadmill   MPH 2.7       Grade 3        Minutes 15       METs 4.19               NuStep   Level 3       Minutes 15       METs 2.9               Elliptical   Level 1       Speed 2.5       Minutes 15       METs 2               Home Exercise Plan   Plans to continue exercise at Home (comment)  walking and staff videos       Frequency Add 2 additional days to program exercise sessions.       Initial Home Exercises Provided 06/03/20  Exercise Comments:  Exercise Comments    Row Name 07/15/20 5188           Exercise Comments Weight up 3 pounds- Aston told staff that he ate more than his usual and had pizza.              Exercise Goals and Review:  Exercise Goals    Row Name 04/14/20 1650             Exercise Goals   Increase Physical Activity Yes       Intervention Provide advice, education, support and counseling about physical activity/exercise needs.;Develop an individualized exercise prescription for aerobic and resistive training based on initial evaluation findings, risk stratification, comorbidities and participant's personal goals.       Expected Outcomes Short Term: Attend rehab on a regular basis to increase amount of physical activity.;Long Term: Add in home exercise to make exercise part of routine and to increase amount of physical activity.;Long Term: Exercising regularly at least 3-5 days a week.       Increase Strength and Stamina Yes       Intervention Provide advice, education, support and counseling about physical activity/exercise needs.;Develop an individualized exercise prescription for aerobic and resistive training based on initial evaluation findings, risk stratification, comorbidities and participant's personal goals.       Expected Outcomes Short Term: Increase workloads from initial exercise prescription for resistance, speed, and METs.;Short Term: Perform resistance training exercises routinely during rehab and add in resistance training at home;Long Term:  Improve cardiorespiratory fitness, muscular endurance and strength as measured by increased METs and functional capacity (6MWT)       Able to understand and use rate of perceived exertion (RPE) scale Yes       Intervention Provide education and explanation on how to use RPE scale       Expected Outcomes Short Term: Able to use RPE daily in rehab to express subjective intensity level;Long Term:  Able to use RPE to guide intensity level when exercising independently       Able to understand and use Dyspnea scale Yes       Intervention Provide education and explanation on how to use Dyspnea scale       Expected Outcomes Short Term: Able to use Dyspnea scale daily in rehab to express subjective sense of shortness of breath during exertion;Long Term: Able to use Dyspnea scale to guide intensity level when exercising independently       Knowledge and understanding of Target Heart Rate Range (THRR) Yes       Intervention Provide education and explanation of THRR including how the numbers were predicted and where they are located for reference       Expected Outcomes Short Term: Able to state/look up THRR;Short Term: Able to use daily as guideline for intensity in rehab;Long Term: Able to use THRR to govern intensity when exercising independently       Able to check pulse independently Yes       Intervention Provide education and demonstration on how to check pulse in carotid and radial arteries.;Review the importance of being able to check your own pulse for safety during independent exercise       Expected Outcomes Short Term: Able to explain why pulse checking is important during independent exercise;Long Term: Able to check pulse independently and accurately       Understanding of Exercise Prescription Yes       Intervention Provide education, explanation, and written materials  on patient's individual exercise prescription       Expected Outcomes Short Term: Able to explain program exercise  prescription;Long Term: Able to explain home exercise prescription to exercise independently              Exercise Goals Re-Evaluation :  Exercise Goals Re-Evaluation    Row Name 04/18/20 1539 05/09/20 1558 05/25/20 1652 06/03/20 0901 06/07/20 0931     Exercise Goal Re-Evaluation   Exercise Goals Review Increase Physical Activity;Able to understand and use rate of perceived exertion (RPE) scale;Knowledge and understanding of Target Heart Rate Range (THRR);Understanding of Exercise Prescription;Increase Strength and Stamina;Able to check pulse independently Increase Physical Activity;Increase Strength and Stamina;Understanding of Exercise Prescription Increase Physical Activity;Increase Strength and Stamina;Understanding of Exercise Prescription Increase Physical Activity;Increase Strength and Stamina;Understanding of Exercise Prescription Increase Physical Activity;Increase Strength and Stamina;Understanding of Exercise Prescription   Comments Reviewed RPE and dyspnea scales, THR and program prescription with pt today.  Pt voiced understanding and was given a copy of goals to take home. Only attended twice since last review.  Pt currently in ED.  Will need negative test to return.  We will continue to montior his progress. Today is Cortlin's first day back after being out for a while.  Staff will monitor progress. Ilai has already started to walk on his other two days a week, so we talked about adding in a third day at home.  Reviewed home exercise with pt today.  Pt plans to walking at home for exercise.  Also gave access to our staff YouTube page for exercise and education videos.  Reviewed THR, pulse, RPE, sign and symptoms, pulse oximetery and when to call 911 or MD.  Also discussed weather considerations and indoor options.  Pt voiced understanding. Killian attends consistentl and works in [employer] and RPE range.  Staff will monitor progress.   Expected Outcomes Short: Use RPE daily to regulate intensity.  Long: Follow program prescription in THR. Short: Improved attendance Long: Continue to follow program prescription. Short: attend consistently Long:  improve MET level Short: Start to add in a third day of exercise at home Long: Continue to improve stamina. Short: continue to exercise consistently Long:  increase MET level   Row Name 06/21/20 0912 07/05/20 0815 07/06/20 0801 07/18/20 1326 07/20/20 0755     Exercise Goal Re-Evaluation   Exercise Goals Review Increase Physical Activity;Increase Strength and Stamina;Understanding of Exercise Prescription Increase Physical Activity;Increase Strength and Stamina;Understanding of Exercise Prescription Increase Physical Activity;Increase Strength and Stamina;Understanding of Exercise Prescription -- Increase Physical Activity;Increase Strength and Stamina;Understanding of Exercise Prescription   Comments Wilkins has increased speed and grade on TM.  he is progressing well. Violet continues to do well in rehab.  He is now up to 4 METs on the NuStep.  We will conitnue to monitor his progress. Elvert has been doing well.  He enjoyed vacation last week and is now determined to get the weight back off.  He normally goes to the gym with his wife at least one extra day a week if not twice a week.  He is feeling stronger and has more stamina now then he did at the beginning. Naser works in [employer] and RPE range when he attends.  As noted, he also works out on days not at [employer].  Staff will monitor progress. Carzell is doing well in rehab.  His wife has been going to [employer], and he hopes to join her there on occasion.  His weight is up today  and he feels more tired.  Overall, he was doing better.   Expected Outcomes Short: continue to progress workloads Long:  improve stamina Short: Continue to have good attendance and increase workloads Long: Continue to improve stamina Short: Get back to exercise routine Long: Continue to improve stamina. Short: exercise consistently Long:  improve MET level Short: Get back to exercise at home Long; continue to improve stamina.   [place] Name 08/01/20 1258 08/29/20 1628           Exercise Goal Re-Evaluation   Exercise Goals Review Increase Physical Activity;Increase Strength and Stamina;Understanding of Exercise Prescription --      Comments Alann has not attended since he came in so upset on 11/16.  The office notes him wanting to attend a 7 week rehabilitation program.  We are hoping to hear from him this week.  He had been doing well in rehab and was up 2.7 mph with 3% grade.  We will continue to monitor his progress. Out since last review      Expected Outcomes Short: Return to regular attendance Long: Continue to exercise independently. --             Discharge Exercise Prescription (Final Exercise Prescription Changes):  Exercise Prescription Changes - 08/01/20 1300      Response to Exercise   Blood Pressure (Admit) 130/78    Blood Pressure (Exercise) 142/80    Blood Pressure (Exit) 124/60    Heart Rate (Admit) 106 bpm    Heart Rate (Exercise) 159 bpm    Heart Rate (Exit) 128 bpm    Rating of Perceived Exertion (Exercise) 14    Symptoms none    Duration Continue with 30 min of aerobic exercise without signs/symptoms of physical distress.    Intensity THRR unchanged      Progression   Progression Continue to progress workloads to maintain intensity without signs/symptoms of physical distress.    Average METs 3.03      Resistance Training   Training Prescription Yes    Weight 5 lb    Reps 10-15      Interval Training   Interval Training No      Treadmill   MPH 2.7    Grade 3    Minutes 15    METs 4.19      NuStep   Level 3    Minutes 15    METs 2.9      Elliptical   Level 1    Speed 2.5    Minutes 15    METs 2      Home Exercise Plan   Plans to continue exercise at Home (comment)   walking and staff videos   Frequency Add 2 additional days to program exercise sessions.    Initial Home  Exercises Provided 06/03/20           Nutrition:  Target Goals: Understanding of nutrition guidelines, daily intake of sodium <157m, cholesterol <2045m calories 30% from fat and 7% or less from saturated fats, daily to have 5 or more servings of fruits and vegetables.  Education: All About Nutrition: -Group instruction provided by verbal, written material, interactive activities, discussions, models, and posters to present general guidelines for heart healthy nutrition including fat, fiber, MyPlate, the role of sodium in heart healthy nutrition, utilization of the nutrition label, and utilization of this knowledge for meal planning. Follow up email sent as well. Written material given at graduation.   Biometrics:  Pre Biometrics - 04/14/20 1640  Pre Biometrics   Height 6' 0.5" (1.842 m)    Weight 253 lb (114.8 kg)   Verbal from patient   BMI (Calculated) 33.82    Single Leg Stand 30 seconds            Nutrition Therapy Plan and Nutrition Goals:  Nutrition Therapy & Goals - 05/30/20 1535      Nutrition Therapy   Diet Heart healthy, low Na, Gout MNT    Drug/Food Interactions Purine/Gout    Protein (specify units) 95g    Fiber 30 grams    Whole Grain Foods 3 servings    Saturated Fats 12 max. grams    Fruits and Vegetables 5 servings/day    Sodium 1.5 grams      Personal Nutrition Goals   Nutrition Goal ST: reduce soda, add new meals, review paperwork LT: leanr how to eat healthier    Comments works 3rd shift - 5 one week and 2 the next week. B: bacon and eggs or cereal or nothing. sleep during the day. D: limits beef (has gout), has chicken and fish. Has pinto beans, black beans, green beans and lima beans, sweet potatoes,yellow squash, onions, zucchini, salad (bell peppers, cucumbers, carrots, onions). WIll cook eggs in butter, but will normally use olive oil. On sundays 1-2x/month will make fried chicken in vegetable oil. Drinks: pt reports not drinking enough water.  Drink 3-4 bottles of 18oz soda. Discussed heart healthy eating. Pt feels he struggles with meal planning - reviewed and provided paperwork.      Intervention Plan   Intervention Prescribe, educate and counsel regarding individualized specific dietary modifications aiming towards targeted core components such as weight, hypertension, lipid management, diabetes, heart failure and other comorbidities.;Nutrition handout(s) given to patient.    Expected Outcomes Short Term Goal: Understand basic principles of dietary content, such as calories, fat, sodium, cholesterol and nutrients.;Short Term Goal: A plan has been developed with personal nutrition goals set during dietitian appointment.;Long Term Goal: Adherence to prescribed nutrition plan.           Nutrition Assessments:  MEDIFICTS Score Key:  ?70 Need to make dietary changes   40-70 Heart Healthy Diet  ? 40 Therapeutic Level Cholesterol Diet   Picture Your Plate Scores:  <84 Unhealthy dietary pattern with much room for improvement.  41-50 Dietary pattern unlikely to meet recommendations for good health and room for improvement.  51-60 More healthful dietary pattern, with some room for improvement.   >60 Healthy dietary pattern, although there may be some specific behaviors that could be improved.    Nutrition Goals Re-Evaluation:  Nutrition Goals Re-Evaluation    [place] Name 07/06/20 0805 07/20/20 0756           Goals   Nutrition Goal ST: reduce soda, add new meals, review paperwork LT: leanr how to eat healthier ST: reduce soda, add new meals, review paperwork LT: leanr how to eat healthier      Comment Kreg was on vacation last week and got away from his diet.  He was previously doing better, but knows he needs to focus in on his diet given his diabetes.  He is determined to try to watch his sugar intake a little more closely. Deontrey has had pizza and spaghetti this week and his weight has gone up.  They usually use zuchinni  noodles, but not this time.  He is going to get back to it.  He has cut out the soda and not snacking.  Expected Outcome Short: Reduce sugar.  Long: Continue to eat better Short: Get back to diet Long: Continue to eat better.             Nutrition Goals Discharge (Final Nutrition Goals Re-Evaluation):  Nutrition Goals Re-Evaluation - 07/20/20 0756      Goals   Nutrition Goal ST: reduce soda, add new meals, review paperwork LT: leanr how to eat healthier    Comment Philipe has had pizza and spaghetti this week and his weight has gone up.  They usually use zuchinni noodles, but not this time.  He is going to get back to it.  He has cut out the soda and not snacking.    Expected Outcome Short: Get back to diet Long: Continue to eat better.           Psychosocial: Target Goals: Acknowledge presence or absence of significant depression and/or stress, maximize coping skills, provide positive support system. Participant is able to verbalize types and ability to use techniques and skills needed for reducing stress and depression.   Education: Stress, Anxiety, and Depression - Group verbal and visual presentation to define topics covered.  Reviews how body is impacted by stress, anxiety, and depression.  Also discusses healthy ways to reduce stress and to treat/manage anxiety and depression.  Written material given at graduation.   Education: Sleep Hygiene -Provides group verbal and written instruction about how sleep can affect your health.  Define sleep hygiene, discuss sleep cycles and impact of sleep habits. Review good sleep hygiene tips.    Initial Review & Psychosocial Screening:  Initial Psych Review & Screening - 04/08/20 1440      Initial Review   Current issues with Current Stress Concerns;Current Sleep Concerns    Source of Stress Concerns Occupation      [place]? Yes   wife     Barriers   Psychosocial barriers to participate in program  There are no identifiable barriers or psychosocial needs.;The patient should benefit from training in stress management and relaxation.      Screening Interventions   Interventions Encouraged to exercise;To provide support and resources with identified psychosocial needs;Provide feedback about the scores to participant    Expected Outcomes Short Term goal: Utilizing psychosocial counselor, staff and physician to assist with identification of specific Stressors or current issues interfering with healing process. Setting desired goal for each stressor or current issue identified.;Long Term Goal: Stressors or current issues are controlled or eliminated.;Short Term goal: Identification and review with participant of any Quality of Life or Depression concerns found by scoring the questionnaire.;Long Term goal: The participant improves quality of Life and PHQ9 Scores as seen by post scores and/or verbalization of changes           Quality of Life Scores:   Quality of Life - 04/14/20 1639      Quality of Life   Select Quality of Life      Quality of Life Scores   Health/Function Pre 14.46 %    Socioeconomic Pre 20 %    Psych/Spiritual Pre 19.71 %    Family Pre 21.6 %    GLOBAL Pre 17.83 %          Scores of 19 and below usually indicate a poorer quality of life in these areas.  A difference of  2-3 points is a clinically meaningful difference.  A difference of 2-3 points in the total score of the Quality of Life Index  has been associated with significant improvement in overall quality of life, self-image, physical symptoms, and general health in studies assessing change in quality of life.  PHQ-9: Recent Review Flowsheet Data    Depression screen [facility] 2/9 05/25/2020 04/14/2020   Decreased Interest 0 1   Down, Depressed, Hopeless 0 1   PHQ - 2 Score 0 2   Altered sleeping 1 0   Tired, decreased energy 2 1   Change in appetite 0 1   Feeling bad or failure about yourself  0 1   Trouble  concentrating 0 0   Moving slowly or fidgety/restless 0 0   Suicidal thoughts 0 0   PHQ-9 Score 3 5   Difficult doing work/chores Not difficult at all Somewhat difficult     Interpretation of Total Score  Total Score Depression Severity:  1-4 = Minimal depression, 5-9 = Mild depression, 10-14 = Moderate depression, 15-19 = Moderately severe depression, 20-27 = Severe depression   Psychosocial Evaluation and Intervention:  Psychosocial Evaluation - 04/08/20 1455      Psychosocial Evaluation & Interventions   Interventions Relaxation education;Stress management education;Encouraged to exercise with the program and follow exercise prescription    Comments Eliott 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 He states he is a little nervous getting back into the night shift routine. His wife is helping him eat healthier and they both are looking forward to meeting with the dietician. His heart attack came as a surprise to him and he really wants to work hard to maintain a heart healthy lifestyle.    Expected Outcomes Short: attend cardiac rehab for education and exercise. Long; develop and maintain positive self care habits.    Continue Psychosocial Services  Follow up required by staff           Psychosocial Re-Evaluation:  Psychosocial Re-Evaluation    [place] Name 06/03/20 0902 07/06/20 0803 07/20/20 0800         Psychosocial Re-Evaluation   Current issues with Current Stress Concerns;Current Sleep Concerns Current Stress Concerns;Current Sleep Concerns Current Stress Concerns;Current Sleep Concerns     Comments Duayne works 12 hour night shifts.  Thus he does not sleep well.  After a few shifts in a row, he sleeps very well.  He usually averages about 5-6 hours a night. His biggest stressor are his home life with step kids and his 3 year old daughter.  He is doing the best that he can. Jesaiah continues to do the best he can with his sleep given his work schedule.  He  was able to sleep well last week while on vacation.  He enjoyed vacation and not really looking forward to getting back to work.  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  He is not feeling the best and hopes its just lingering effects.  Otherwise, things are doing okay at home. Xavi is frustrated with his weight.  He also notes some SOB and he feels like a ticking time bomb.  He wishes he was better with his breathing.  He is doing okay with stress at work and he still likes working.  It does disrupt his sleep but he manages.     Expected Outcomes Short: Continue to sleep as best he can Long; Continue to focus on the positive. Short; Continue to sleep when he can  Long: Stay positive Short: Continue to work on weight loss Long: Continue  to stay positive.     Interventions Encouraged to attend Cardiac Rehabilitation for the exercise Encouraged to attend Cardiac Rehabilitation for the exercise Encouraged to attend Cardiac Rehabilitation for the exercise     Continue Psychosocial Services  Follow up required by staff Follow up required by staff Follow up required by staff            Psychosocial Discharge (Final Psychosocial Re-Evaluation):  Psychosocial Re-Evaluation - 07/20/20 0800      Psychosocial Re-Evaluation   Current issues with Current Stress Concerns;Current Sleep Concerns    Comments Kristi is frustrated with his weight.  He also notes some SOB and he feels like a ticking time bomb.  He wishes he was better with his breathing.  He is doing okay with stress at work and he still likes working.  It does disrupt his sleep but he manages.    Expected Outcomes Short: Continue to work on weight loss Long: Continue to stay positive.    Interventions Encouraged to attend Cardiac Rehabilitation for the exercise    Continue Psychosocial Services  Follow up required by staff           Vocational Rehabilitation: Provide vocational rehab  assistance to qualifying candidates.   Vocational Rehab Evaluation & Intervention:  Vocational Rehab - 04/08/20 1440      Initial Vocational Rehab Evaluation & Intervention   Assessment shows need for Vocational Rehabilitation No           Education: Education Goals: Education classes will be provided on a variety of topics geared toward better understanding of heart health and risk factor modification. Participant will state understanding/return demonstration of topics presented as noted by education test scores.  Learning Barriers/Preferences:  Learning Barriers/Preferences - 04/08/20 1440      Learning Barriers/Preferences   Learning Barriers None    Learning Preferences None           General Cardiac Education Topics:  AED/CPR: - Group verbal and written instruction with the use of models to demonstrate the basic use of the AED with the basic ABC's of resuscitation.   Anatomy and Cardiac Procedures: - Group verbal and visual presentation and models provide information about basic cardiac anatomy and function. Reviews the testing methods done to diagnose heart disease and the outcomes of the test results. Describes the treatment choices: Medical Management, Angioplasty, or Coronary Bypass Surgery for treating various heart conditions including Myocardial Infarction, Angina, Valve Disease, and Cardiac Arrhythmias.  Written material given at graduation.   Medication Safety: - Group verbal and visual instruction to review commonly prescribed medications for heart and lung disease. Reviews the medication, class of the drug, and side effects. Includes the steps to properly store meds and maintain the prescription regimen.  Written material given at graduation. Flowsheet Row Cardiac Rehab from 07/06/2020 in [facility] Cardiac and Pulmonary Rehab  Date 05/25/20  Educator SB  Instruction Review Code 1- Verbalizes Understanding      Intimacy: - Group verbal instruction through game  format to discuss how heart and lung disease can affect sexual intimacy. Written material given at graduation.. Flowsheet Row Cardiac Rehab from 07/06/2020 in [facility] Cardiac and Pulmonary Rehab  Date 04/27/20  Educator AS  Instruction Review Code 1- Verbalizes Understanding      Know Your Numbers and Heart Failure: - Group verbal and visual instruction to discuss disease risk factors for cardiac and pulmonary disease and treatment options.  Reviews associated critical values for Overweight/Obesity, Hypertension, Cholesterol, and Diabetes.  Discusses  basics of heart failure: signs/symptoms and treatments.  Introduces Heart Failure Zone chart for action plan for heart failure.  Written material given at graduation.   Infection Prevention: - Provides verbal and written material to individual with discussion of infection control including proper hand washing and proper equipment cleaning during exercise session. Flowsheet Row Cardiac Rehab from 07/06/2020 in [facility] Cardiac and Pulmonary Rehab  Date 04/14/20  Educator [place]  Instruction Review Code 1- Verbalizes Understanding      Falls Prevention: - Provides verbal and written material to individual with discussion of falls prevention and safety. Flowsheet Row Cardiac Rehab from 07/06/2020 in [facility] Cardiac and Pulmonary Rehab  Date 04/14/20  Educator [place]  Instruction Review Code 1- Verbalizes Understanding      Other: -Provides group and verbal instruction on various topics (see comments)   Knowledge Questionnaire Score:  Knowledge Questionnaire Score - 04/14/20 1633      Knowledge Questionnaire Score   Pre Score 20/26: A&P, Angina, Nutrition, Exercise           Core Components/Risk Factors/Patient Goals at Admission:  Personal Goals and Risk Factors at Admission - 04/14/20 1652      Core Components/Risk Factors/Patient Goals on Admission    Weight Management Yes;Weight Loss    Intervention Weight Management: Develop a combined  nutrition and exercise program designed to reach desired caloric intake, while maintaining appropriate intake of nutrient and fiber, sodium and fats, and appropriate energy expenditure required for the weight goal.;Weight Management: Provide education and appropriate resources to help participant work on and attain dietary goals.;Weight Management/Obesity: Establish reasonable short term and long term weight goals.    Admit Weight 253 lb (114.8 kg)    Goal Weight: Short Term 248 lb (112.5 kg)    Goal Weight: Long Term 243 lb (110.2 kg)    Expected Outcomes Short Term: Continue to assess and modify interventions until short term weight is achieved;Long Term: Adherence to nutrition and physical activity/exercise program aimed toward attainment of established weight goal;Weight Loss: Understanding of general recommendations for a balanced deficit meal plan, which promotes 1-2 lb weight loss per week and includes a negative energy balance of [phone] kcal/d;Understanding recommendations for meals to include 15-35% energy as protein, 25-35% energy from fat, 35-60% energy from carbohydrates, less than 273m of dietary cholesterol, 20-35 gm of total fiber daily;Understanding of distribution of calorie intake throughout the day with the consumption of 4-5 meals/snacks    Diabetes Yes    Intervention Provide education about signs/symptoms and action to take for hypo/hyperglycemia.;Provide education about proper nutrition, including hydration, and aerobic/resistive exercise prescription along with prescribed medications to achieve blood glucose in normal ranges: Fasting glucose 65-99 mg/dL    Expected Outcomes Short Term: Participant verbalizes understanding of the signs/symptoms and immediate care of hyper/hypoglycemia, proper foot care and importance of medication, aerobic/resistive exercise and nutrition plan for blood glucose control.;Long Term: Attainment of HbA1C < 7%.    Hypertension Yes    Intervention  Provide education on lifestyle modifcations including regular physical activity/exercise, weight management, moderate sodium restriction and increased consumption of fresh fruit, vegetables, and low fat dairy, alcohol moderation, and smoking cessation.;Monitor prescription use compliance.    Expected Outcomes Short Term: Continued assessment and intervention until BP is < 140/99mHG in hypertensive participants. < 130/8058mG in hypertensive participants with diabetes, heart failure or chronic kidney disease.;Long Term: Maintenance of blood pressure at goal levels.           Education:Diabetes - Individual verbal and  written instruction to review signs/symptoms of diabetes, desired ranges of glucose level fasting, after meals and with exercise. Acknowledge that pre and post exercise glucose checks will be done for 3 sessions at entry of program. [place] from 07/06/2020 in [facility] Cardiac and Pulmonary Rehab  Date 04/14/20  Educator [place]  Instruction Review Code 1- Verbalizes Understanding      Core Components/Risk Factors/Patient Goals Review:   Goals and Risk Factor Review    Row Name 06/03/20 0906 07/06/20 0806 07/20/20 0758         Core Components/Risk Factors/Patient Goals Review   Personal Goals Review Weight Management/Obesity;Diabetes;Hypertension;Lipids Weight Management/Obesity;Diabetes;Hypertension;Lipids Weight Management/Obesity;Diabetes;Hypertension;Lipids     Review Slate is doing well in rehab. He is working on weight loss and his weight has been yoyoing. However, he is trending down again.  His pressures have been good and he checks them at home.  His sugars have been in the 170s but he was not eating well and is trying to get back to it. Sahil did not gain too much weight on vacation (only about 1-2 lbs) which made his wife mad as she gained 5-8 lb.  He is working to get it back down now that they are back.  His blood pressures have continued to do well.   However, his sugars continue to stay elevated around the 180s.  He knows this is tied into his diet and he is going to work on it. Arvin's weight is up today.  He is trying to work on his diet, but cheated some this week.  He is taking more salads to work to help.  His blood sugars are doing pretty good and usually staying around 180 still.  Blood pressures continue to do well and he is off his med for it.     Expected Outcomes Short: Work on weight loss Long; Conitnue to monitor risk factors. Short; Reduce sugar and improve diabetes management Long; Conitnue to monitor risk factors. Short: Get back to diet and work on weight loss Long: COntinue to lose weight.            Core Components/Risk Factors/Patient Goals at Discharge (Final Review):   Goals and Risk Factor Review - 07/20/20 0758      Core Components/Risk Factors/Patient Goals Review   Personal Goals Review Weight Management/Obesity;Diabetes;Hypertension;Lipids    Review Amando's weight is up today.  He is trying to work on his diet, but cheated some this week.  He is taking more salads to work to help.  His blood sugars are doing pretty good and usually staying around 180 still.  Blood pressures continue to do well and he is off his med for it.    Expected Outcomes Short: Get back to diet and work on weight loss Long: COntinue to lose weight.           ITP Comments:  ITP Comments    Row Name 04/08/20 1450 04/14/20 1645 04/18/20 1538 04/20/20 0734 05/09/20 1557   ITP Comments Initial telephone orientation completed. Diagnosis can be found in [facility] 6/21. EP orienation scheduled for Thursday, 8/5 at 2:30 Completed 6MWT and gym orientation. Initial ITP created and sent for review to Dr. Emily Filbert, Medical Director. First full day of exercise!  Patient was oriented to gym and equipment including functions, settings, policies, and procedures.  Patient's individual exercise prescription and treatment plan were reviewed.  All starting  workloads were established based on the results of the 6 minute walk test done  at initial orientation visit.  The plan for exercise progression was also introduced and progression will be customized based on patient's performance and goals. 30 Day review completed. Medical Director ITP review done, changes made as directed, and signed approval by Medical Director. Pt currently (8/30) in ED for COVID symptoms/chest tightness.  Tested negative on 8/24.   Row Name 05/18/20 1644 06/15/20 0637 07/13/20 0726 07/15/20 0826 08/01/20 1300   ITP Comments 30 day review completed. ITP sent to Dr. Emily Filbert, Medical Director of Cardiac and Pulmonary Rehab. Continue with ITP unless changes are made by physician. 30 Day review completed. Medical Director ITP review done, changes made as directed, and signed approval by Medical Director. 30 Day review completed. Medical Director ITP review done, changes made as directed, and signed approval by Medical Director. Weight up 3 pounds- Niklas told staff that he ate more than his usual and had pizza. Khalen has not attended since he came in so upset on 11/16.  The office notes show him wanting to attend a 7 week rehabilitation program.  We are hoping to hear from him this week.   [place] Name 08/10/20 1053 08/29/20 1628 09/07/20 0728 09/08/20 1150     ITP Comments 30 Day review completed. Medical Director ITP review done, changes made as directed, and signed approval by Medical Director. 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 Medical Director ITP review done, changes made as directed, and signed approval by Medical Director. Aedin is being discharged. He is currently in a rehab facility           Comments: Discharge ITP

## 2020-09-16 ENCOUNTER — Encounter (HOSPITAL_COMMUNITY): Payer: Self-pay | Admitting: Emergency Medicine

## 2020-09-16 ENCOUNTER — Emergency Department (HOSPITAL_COMMUNITY)
Admission: EM | Admit: 2020-09-16 | Discharge: 2020-09-16 | Disposition: A | Discharge status: Home / Self Care | Payer: Commercial Managed Care - PPO | Attending: Emergency Medicine | Admitting: Emergency Medicine

## 2020-09-16 ENCOUNTER — Other Ambulatory Visit: Payer: Self-pay

## 2020-09-16 ENCOUNTER — Emergency Department (HOSPITAL_COMMUNITY): Payer: Commercial Managed Care - PPO

## 2020-09-16 DIAGNOSIS — Z79899 Other long term (current) drug therapy: Secondary | ICD-10-CM | POA: Insufficient documentation

## 2020-09-16 DIAGNOSIS — M1732 Unilateral post-traumatic osteoarthritis, left knee: Secondary | ICD-10-CM | POA: Insufficient documentation

## 2020-09-16 DIAGNOSIS — M25562 Pain in left knee: Secondary | ICD-10-CM | POA: Diagnosis present

## 2020-09-16 DIAGNOSIS — I5040 Unspecified combined systolic (congestive) and diastolic (congestive) heart failure: Secondary | ICD-10-CM | POA: Insufficient documentation

## 2020-09-16 DIAGNOSIS — M25462 Effusion, left knee: Secondary | ICD-10-CM

## 2020-09-16 DIAGNOSIS — I2510 Atherosclerotic heart disease of native coronary artery without angina pectoris: Secondary | ICD-10-CM | POA: Diagnosis not present

## 2020-09-16 DIAGNOSIS — I110 Hypertensive heart disease with heart failure: Secondary | ICD-10-CM | POA: Insufficient documentation

## 2020-09-16 DIAGNOSIS — E119 Type 2 diabetes mellitus without complications: Secondary | ICD-10-CM | POA: Insufficient documentation

## 2020-09-16 DIAGNOSIS — R52 Pain, unspecified: Secondary | ICD-10-CM

## 2020-09-16 DIAGNOSIS — Z7982 Long term (current) use of aspirin: Secondary | ICD-10-CM | POA: Diagnosis not present

## 2020-09-16 DIAGNOSIS — Z7984 Long term (current) use of oral hypoglycemic drugs: Secondary | ICD-10-CM | POA: Diagnosis not present

## 2020-09-16 MED ORDER — NAPROXEN 500 MG PO TABS
500.0000 mg | ORAL_TABLET | Freq: Once | ORAL | Status: AC
  Administered 2020-09-16: 500 mg via ORAL
  Filled 2020-09-16: qty 1

## 2020-09-16 MED ORDER — DICLOFENAC SODIUM 1 % EX GEL: 2.0000 g | Freq: Four times a day (QID) | CUTANEOUS | 0 refills | Status: AC

## 2020-09-16 NOTE — ED Provider Notes (Signed)
[facility]-EMERGENCY DEPT Provider Note   CSN: [id number] Arrival date & time: 09/16/20  0800     History Chief Complaint  Patient presents with  . Knee Pain    Joshua Holder is a 55 y.o. male.  55 year old male presents with complaint of left knee pain and swelling. Patient reports significant knee injury in 2000 has hardware in the knee. Patient states that he has been walking more than usual and inpatient alcohol rehab program, including injuries, no exposures, no redness and no fevers. Patient states pain and swelling of her left knee is not improving with ibuprofen alone, worse with bearing weight. No other complaints or concerns.        Past Medical History:  Diagnosis Date  . Atrial fibrillation with RVR (HCC) 02/29/2020   a. <48 hours in setting of inferior STEMI  . Atrial flutter (HCC)    a. 03/2020 - converted w/ IV dilt in ED; b. 03/2020 Zio: no recurrent Afib/flutter.  . 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 Coronary artery disease 02/29/2020   a. 02/29/20 late presenting inferior STEMI thrombotic occlusion RCA with L-R collaterals. PTCA attempted but unsuccessful-->Med Rx.  . 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 Gout   . HLD (hyperlipidemia) 02/29/2020  . Hypertension   . Ischemic cardiomyopathy 02/29/2020   a. EF during cath 03/01/20 35-40%; b. Echo 03/01/20 EF 50-55%; c. Echo 03/17/20 EF 50%, gr2DD  . MI (myocardial infarction) [facility])     Patient Active Problem List   Diagnosis Date Noted  . MI (myocardial infarction) (HCC)   . Hypertension   . 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 History of ST elevation myocardial infarction (STEMI) 02/29/20 03/17/2020  . AKI (acute kidney injury) (HCC) 03/17/2020  . Atrial flutter (HCC) 03/17/2020  . Demand ischemia (HCC) 03/17/2020  . Atrial fibrillation with RVR (HCC) 03/02/2020  . Ischemic cardiomyopathy 03/02/2020  . Acute ST elevation myocardial infarction  (STEMI) of inferior wall (HCC) 03/01/2020  . NSTEMI (non-ST elevated myocardial infarction) (HCC) 02/29/2020  . HLD (hyperlipidemia) 02/29/2020  . Coronary artery disease 02/29/2020  . 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 Olecranon bursitis of left elbow 01/13/2019  . DM (diabetes mellitus) (HCC) 01/13/2019  . HTN (hypertension) 01/13/2019  . Gout 01/13/2019  . Cellulitis 12/29/2018    Past Surgical History:  Procedure Laterality Date  . CORONARY/GRAFT ACUTE MI REVASCULARIZATION N/A 03/01/2020   Procedure: Coronary/Graft Acute MI Revascularization;  Surgeon: Iran Ouch, MD;  Location: [facility] INVASIVE CV LAB;  Service: Cardiovascular;  Laterality: N/A;  . INCISION AND DRAINAGE Left 12/30/2018   Procedure: INCISION AND DRAINAGE LEFT ELBOW;  Surgeon: Juanell Fairly, MD;  Location: [facility] ORS;  Service: Orthopedics;  Laterality: Left;  . LEFT HEART CATH AND CORONARY ANGIOGRAPHY N/A 03/01/2020   Procedure: LEFT HEART CATH AND CORONARY ANGIOGRAPHY;  Surgeon: Iran Ouch, MD;  Location: [facility] INVASIVE CV LAB;  Service: Cardiovascular;  Laterality: N/A;       Family History  Problem Relation Age of Onset  . Heart attack Father     Social History   Tobacco Use  . Smoking status: Never Smoker  . Smokeless tobacco: Never Used  Substance Use Topics  . Alcohol use: Yes    Comment: social  . Drug use: Not Currently    Home Medications Prior to Admission medications   Medication Sig Start Date End Date Taking? Authorizing Provider  diclofenac  Sodium (VOLTAREN) 1 % GEL Apply 2 g topically 4 (four) times daily. 09/16/20  Yes Jeannie Fend, PA-C  aspirin EC 81 MG tablet Take 1 tablet (81 mg) by mouth once daily. Swallow whole.    [provider]  atorvastatin (LIPITOR) 80 MG tablet Take 1 tablet (80 mg total) by mouth daily. 03/23/20 09/19/20  Alver Sorrow, NP  icosapent Ethyl (VASCEPA) 1 g capsule Take 2 capsules (2 g total) by mouth 2  (two) times daily. 06/21/20   Creig Hines, NP  Magnesium Oxide 400 MG CAPS Take 1 capsule (400 mg total) by mouth daily. Patient taking differently: Take 400 mg by mouth at bedtime. 03/24/20   Alver Sorrow, NP  metFORMIN (GLUCOPHAGE-XR) 500 MG 24 hr tablet Take 1,000 mg by mouth daily with breakfast.    [provider]  metoprolol tartrate (LOPRESSOR) 25 MG tablet Take 1 tablet (25 mg total) by mouth 2 (two) times daily. 06/08/20 12/05/20  Creig Hines, NP  Multiple Vitamins-Minerals (CENTRUM SILVER 50+MEN PO) Take 1 tablet by mouth daily.    [provider]  nitroGLYCERIN (NITROSTAT) 0.4 MG SL tablet Place 1 tablet (0.4 mg total) under the tongue every 5 (five) minutes as needed for chest pain. 03/18/20   Lurene Shadow, MD  ticagrelor (BRILINTA) 90 MG TABS tablet Take 1 tablet (90 mg total) by mouth 2 (two) times daily. 03/23/20 09/19/20  Alver Sorrow, NP    Allergies    Lisinopril  Review of Systems   Review of Systems  Constitutional: Negative for fever.  Musculoskeletal: Positive for arthralgias, gait problem and joint swelling.  Skin: Negative for color change, rash and wound.  Allergic/Immunologic: Negative for immunocompromised state.  Neurological: Negative for weakness and numbness.    Physical Exam Updated Vital Signs BP (!) 112/94   Pulse 69   Temp 97.7 F (36.5 C) (Oral)   Resp 16   SpO2 99%   Physical Exam Vitals and nursing note reviewed.  Constitutional:      General: He is not in acute distress.    Appearance: He is well-developed and well-nourished. He is not diaphoretic.  HENT:     Head: Normocephalic and atraumatic.  Pulmonary:     Effort: Pulmonary effort is normal.  Musculoskeletal:        General: Swelling and tenderness present. No deformity.     Left knee: Effusion present. No erythema, ecchymosis, bony tenderness or crepitus. Decreased range of motion. Tenderness present over the medial joint line. No  patellar tendon tenderness. Normal pulse.  Skin:    General: Skin is warm and dry.     Findings: No erythema or rash.  Neurological:     Mental Status: He is alert and oriented to person, place, and time.     Sensory: No sensory deficit.     Motor: No weakness.  Psychiatric:        Mood and Affect: Mood and affect normal.        Behavior: Behavior normal.     ED Results / Procedures / Treatments   Labs (all labs ordered are listed, but only abnormal results are displayed) Labs Reviewed - No data to display  EKG None  Radiology DG Knee Left Port  Result Date: 09/16/2020 CLINICAL DATA:  Left knee pain for 1 week, no known injury, initial encounter EXAM: PORTABLE LEFT KNEE - 4 VIEW COMPARISON:  None. FINDINGS: Postsurgical changes are noted in the proximal tibia. No hardware failure is seen.  Patellofemoral degenerative changes are noted with small joint effusion. Mild medial joint space narrowing is noted as well. Healed proximal fibular fracture is noted. IMPRESSION: Degenerative change with small joint effusion. No acute bony abnormality is seen. Electronically Signed   By: Alcide Clever M.D.   On: 09/16/2020 08:58    Procedures Procedures (including critical care time)  Medications Ordered in ED Medications  naproxen (NAPROSYN) tablet 500 mg (500 mg Oral Given 09/16/20 1610)    ED Course  I have reviewed the triage vital signs and the nursing notes.  Pertinent labs & imaging results that were available during my care of the patient were reviewed by me and considered in my medical decision making (see chart for details).  Clinical Course as of 09/16/20 1000  Fri Sep 16, 2020  2470 55 year old male with acute on chronic left knee pain. On exam has swelling to the left knee with a small effusion, no erythema. Pain with straight leg extension. Tenderness to anterior knee and medial joint line. X-ray shows effusion and degenerative changes, no acute hardware changes. Plan is to  place knee sleeve, continue using crutches to weight-bear as tolerated, given prescription for topical diclofenac, can also take oral NSAID and Tylenol as needed. Referred to orthopedics for follow-up. [LM]    Clinical Course User Index [LM] Alden Hipp   MDM Rules/Calculators/A&P                          Final Clinical Impression(s) / ED Diagnoses Final diagnoses:  Pain  Effusion of left knee  Post-traumatic osteoarthritis of left knee    Rx / DC Orders ED Discharge Orders         Ordered    diclofenac Sodium (VOLTAREN) 1 % GEL  4 times daily        09/16/20 0934           Jeannie Fend, PA-C 09/16/20 1000    Lorre Nick, MD 09/20/20 [phone]

## 2020-09-16 NOTE — Discharge Instructions (Signed)
Wear knee sleeve.  Rest, elevate the knee and apply ice for 20 minutes at a time at least 3 times daily. Apply topical diclofenac as prescribed.  He can also continue with oral ibuprofen or Aleve, and Tylenol as needed. Follow-up with orthopedics, call to schedule appointment.  Many orthopedic clinics also have walk-in or urgent care hours to be seen.

## 2020-09-16 NOTE — ED Triage Notes (Signed)
Per pt, complaining of left knee pain for over a week-states he has rods/pins in left leg-swelling and limited weight bearing

## 2020-09-16 NOTE — ED Notes (Signed)
Patient transported to X-ray 

## 2020-10-03 ENCOUNTER — Other Ambulatory Visit: Payer: Self-pay | Admitting: Cardiovascular Disease

## 2020-10-03 MED ORDER — TICAGRELOR 90 MG PO TABS: 90.0000 mg | ORAL_TABLET | Freq: Two times a day (BID) | ORAL | 1 refills | Status: DC

## 2020-10-03 MED ORDER — ATORVASTATIN CALCIUM 80 MG PO TABS: 80.0000 mg | ORAL_TABLET | Freq: Every day | ORAL | 1 refills | Status: DC

## 2020-10-03 MED ORDER — METOPROLOL TARTRATE 25 MG PO TABS: 25.0000 mg | ORAL_TABLET | Freq: Two times a day (BID) | ORAL | 0 refills | Status: DC

## 2020-10-03 NOTE — Telephone Encounter (Signed)
Rx requests for Metoprolol and Atorvastatin sent to pahrmacy.  Please verify that pt should still be taking Brilinta 90 mg BID. There is no documentation showing discontinuation, but it is not currently on current med list. Will call pt once verified to find out why not on med list anymore.  Thanks!

## 2020-10-03 NOTE — Telephone Encounter (Signed)
*  STAT* If patient is at the pharmacy, call can be transferred to refill team.   1. Which medications need to be refilled? (please list name of each medication and dose if known) Atorvastatin 80 MG 1 tablet daily Brilinta 90 MG 1 tablet 2 times daily (not on list?) Metoprolol tartrate 25 MG 1 tablet 2 times daily  2. Which pharmacy/location (including street and city if local pharmacy) is medication to be sent to? [facility] on [address] and [address] in [place]  3. Do they need a 30 day or 90 day supply? 90 day

## 2020-10-03 NOTE — Telephone Encounter (Signed)
Yes please refill Brilinta.

## 2020-10-03 NOTE — Telephone Encounter (Signed)
Rx request sent to pharmacy.  

## 2020-10-10 ENCOUNTER — Encounter: Payer: Self-pay | Admitting: *Deleted

## 2020-10-10 ENCOUNTER — Telehealth: Payer: Self-pay | Admitting: Nurse Practitioner

## 2020-10-10 NOTE — Telephone Encounter (Signed)
Letter written, faxed, confirmation received, and mailed to patient at requested address of [address], [place] [address]. He was appreciative for call back with follow up information and had no further questions at this time.

## 2020-10-10 NOTE — Telephone Encounter (Signed)
Patient calling in stating that on  08/06/20 he checked in to an alcholic rehab program by Gilford Raid recommendation. Patient is  now needing a letter to return to work as he has completed the program  Patient would like to pick up the letter when it is ready  Please advise

## 2020-10-10 NOTE — Telephone Encounter (Signed)
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 He is also planning on doing some outpatient treatment as well. He just needs note to return to work. Inquired if they gave him anything from the treatment facility and he stated no. He states that Mr. Brion Aliment recommended and that Eula Listen wrote letter for him. Advised I would send to both providers to see if we can get this ready for him. He was very appreciative and will wait for my return call.

## 2020-10-10 NOTE — Telephone Encounter (Signed)
If the inpatient rehab program is unable to provide him with a center following his completion of the program from my perspective, it is okay to draft a return to work letter and I can sign.

## 2020-10-21 ENCOUNTER — Telehealth: Payer: Self-pay | Admitting: Nurse Practitioner

## 2020-10-21 NOTE — Telephone Encounter (Signed)
Rqst fwd to Ward Givens, NP and Dr. Kirke Corin.

## 2020-10-21 NOTE — Telephone Encounter (Signed)
Patients employer is requesting a letter stating that patient is safe to take nitroglycerin while at work when experiencing SOB and slight chest pain. Patient took medication as a precaution but now employer needs to letter stating this is safe to do. Patient would like letter to be faxed to [phone] Attn Nurse Alcario Drought

## 2020-10-21 NOTE — Telephone Encounter (Signed)
That is fine. We can give him a letter.

## 2020-10-21 NOTE — Telephone Encounter (Signed)
Requested letter faxed to the fax number provided by the patient. Fax confirmation received.  Spoke with the patient and made him aware. Patient voiced appreciation for the assistance.

## 2020-10-21 NOTE — Telephone Encounter (Signed)
Patient calling to check on status  Told patient we are awaiting provider approval

## 2020-10-25 ENCOUNTER — Telehealth: Payer: Self-pay | Admitting: Cardiovascular Disease

## 2020-10-25 NOTE — Telephone Encounter (Signed)
Patient was last seen in Nov 2021 by Eula Listen, PA and he is due for follow up. Form placed on Ryans desk. Patient may need to be seen prior to the forms completion.

## 2020-10-25 NOTE — Telephone Encounter (Signed)
Attempted to schedule.  Patient away from home and will call back in an hour.

## 2020-10-25 NOTE — Telephone Encounter (Signed)
Received return to work form.  Placed in nurse box for completion. Per request return via fax to  [phone].

## 2020-11-01 ENCOUNTER — Other Ambulatory Visit: Payer: Self-pay

## 2020-11-01 ENCOUNTER — Ambulatory Visit (INDEPENDENT_AMBULATORY_CARE_PROVIDER_SITE_OTHER): Payer: Commercial Managed Care - PPO | Admitting: Nurse Practitioner

## 2020-11-01 ENCOUNTER — Encounter: Payer: Self-pay | Admitting: Nurse Practitioner

## 2020-11-01 VITALS — BP 108/78 | HR 55 | Ht 71.0 in | Wt 235.0 lb

## 2020-11-01 DIAGNOSIS — E785 Hyperlipidemia, unspecified: Secondary | ICD-10-CM

## 2020-11-01 DIAGNOSIS — Z789 Other specified health status: Secondary | ICD-10-CM

## 2020-11-01 DIAGNOSIS — I480 Paroxysmal atrial fibrillation: Secondary | ICD-10-CM

## 2020-11-01 DIAGNOSIS — I2510 Atherosclerotic heart disease of native coronary artery without angina pectoris: Secondary | ICD-10-CM | POA: Diagnosis not present

## 2020-11-01 DIAGNOSIS — I255 Ischemic cardiomyopathy: Secondary | ICD-10-CM | POA: Diagnosis not present

## 2020-11-01 DIAGNOSIS — I10 Essential (primary) hypertension: Secondary | ICD-10-CM

## 2020-11-01 DIAGNOSIS — Z7289 Other problems related to lifestyle: Secondary | ICD-10-CM

## 2020-11-01 NOTE — Progress Notes (Signed)
Office Visit    Patient Name: Joshua Holder Date of Encounter: 11/01/2020  Primary Care Provider:  System, Provider Not In Primary Cardiologist:  Lorine Bears, MD  Chief Complaint    55 y/o ? w/ a h/o CAD s/p late presenting inf STEMI 02/2020 w/ thrombotic occlusion of the RCA w/ L  R collaterals, HFimpEF, ICM, PAF/flutter, etoh abuse, DM2, HTN, HL, and gout, who presents for CAD f/u.  Past Medical History    Past Medical History:  Diagnosis Date  . Atrial fibrillation with RVR (HCC) 02/29/2020   a. <48 hours in setting of inferior STEMI  . Atrial flutter (HCC)    a. 03/2020 - converted w/ IV dilt in ED; b. 03/2020 [facility]: no recurrent Afib/flutter. 1 run of SVT (9 beats, 125 bpm). Rare PACs/PVCs. Triggered events assoc w/ sinus tachycardia.  . 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 Coronary artery disease 02/29/2020   a. 02/29/20 late presenting inferior STEMI thrombotic occlusion RCA with L-R collaterals. PTCA attempted but unsuccessful-->Med Rx.  . 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 Gout   . HLD (hyperlipidemia) 02/29/2020  . Hypertension   . Ischemic cardiomyopathy 02/29/2020   a. EF during cath 03/01/20 35-40%; b. Echo 03/01/20 EF 50-55%; c. Echo 03/17/20 EF 50%, gr2DD  . MI (myocardial infarction) [facility])    Past Surgical History:  Procedure Laterality Date  . CORONARY/GRAFT ACUTE MI REVASCULARIZATION N/A 03/01/2020   Procedure: Coronary/Graft Acute MI Revascularization;  Surgeon: Iran Ouch, MD;  Location: [facility] INVASIVE CV LAB;  Service: Cardiovascular;  Laterality: N/A;  . INCISION AND DRAINAGE Left 12/30/2018   Procedure: INCISION AND DRAINAGE LEFT ELBOW;  Surgeon: Juanell Fairly, MD;  Location: [facility] ORS;  Service: Orthopedics;  Laterality: Left;  . LEFT HEART CATH AND CORONARY ANGIOGRAPHY N/A 03/01/2020   Procedure: LEFT HEART CATH AND CORONARY ANGIOGRAPHY;  Surgeon: Iran Ouch, MD;  Location: [facility] INVASIVE CV LAB;  Service:  Cardiovascular;  Laterality: N/A;    Allergies  Allergies  Allergen Reactions  . Lisinopril Swelling and Cough    History of Present Illness    55 year old male with a history of CAD status post late presenting inferior STEMI in June 2021 with thrombotic occlusion of the RCA and left to right collaterals, ischemic cardiomyopathy, heart failure with improved ejection fraction, paroxysmal atrial fibrillation/flutter, alcohol abuse, type 2 diabetes mellitus, hypertension, hyperlipidemia, and gout.  As noted above, he was admitted to [facility] on February 29, 2020 with chest pain and dyspnea.  ECG showed inferior ST segment elevation with associated Q waves.  He underwent diagnostic catheterization revealing severe one-vessel CAD and a thrombotic occlusion of the proximal RCA with left-to-right collaterals from the LAD.  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  EF was 35 to 40%.  Balloon angioplasty of the RCA was performed but was unsuccessful medical therapy was recommended.  Post MI course was complicated by brief episode of atrial fibrillation, which converted with IV amiodarone.  He also developed chest pain consistent with pericarditis and was treated with colchicine at discharge.  Unfortunately, he required readmission in July 2021 with atrial flutter that resolved with IV diltiazem.  Repeat echocardiogram July 8 showed an EF of 50% with grade 2 diastolic dysfunction and normal RV size and function.  Mild to moderate mitral regurgitation was noted.  Given ongoing dual antiplatelet therapy, oral anticoagulation was not initiated.  14-day event  monitoring performed as an outpatient did not show any recurrence of atrial fibrillation or flutter.  He was last seen in cardiology clinic in November 2021, at which time he reported stable, somewhat atypical chest discomfort, not similar to prior angina.  He has since completed an alcohol detox program in  [place], and has been etoh free for ~ 130 days.  He has lost ~ 20 lbs since entering the program and notes that he is now exercising regularly w/o limitations.  Unfortunately, he and his wife are in the midst of a separation.  The recently sold their house in [place] and he is now living in their other home in [place].  In the setting of his marital issues, he has noted increased anxiety and depression.  He has continued to have intermittent sharp c/p w/o assoc Ss, lasting up to 30 mins, though overall, these occur less frequently than what was previously described, and are less severe.  Chest pain is happening almost exclusively following an argument with his wife, and typically respond w/ deep breathing or other relaxation techniques.  He was walking into work the other day after having a conversation with his wife and he began to experience chest discomfort with mild dyspnea.  He sat down took a nitroglycerin and it resolved almost immediately.  His employer recommended that he follow-up with cardiology and so patient made appointment today.  He has not had any recurrent symptoms.  He denies palpitations, PND, orthopnea, dizziness, syncope, edema, or early satiety.  Home Medications    Prior to Admission medications   Medication Sig Start Date End Date Taking? Authorizing Provider  aspirin EC 81 MG tablet Take 1 tablet (81 mg) by mouth once daily. Swallow whole.    [provider]  atorvastatin (LIPITOR) 80 MG tablet Take 1 tablet (80 mg total) by mouth daily. 10/03/20 04/01/21  Iran Ouch, MD  diclofenac Sodium (VOLTAREN) 1 % GEL Apply 2 g topically 4 (four) times daily. 09/16/20   Jeannie Fend, PA-C  icosapent Ethyl (VASCEPA) 1 g capsule Take 2 capsules (2 g total) by mouth 2 (two) times daily. 06/21/20   Creig Hines, NP  Magnesium Oxide 400 MG CAPS Take 1 capsule (400 mg total) by mouth daily. Patient taking differently: Take 400 mg by mouth at bedtime. 03/24/20    Alver Sorrow, NP  metFORMIN (GLUCOPHAGE-XR) 500 MG 24 hr tablet Take 1,000 mg by mouth daily with breakfast.    [provider]  metoprolol tartrate (LOPRESSOR) 25 MG tablet Take 1 tablet (25 mg total) by mouth 2 (two) times daily. 10/03/20 04/01/21  Iran Ouch, MD  Multiple Vitamins-Minerals (CENTRUM SILVER 50+MEN PO) Take 1 tablet by mouth daily.    [provider]  nitroGLYCERIN (NITROSTAT) 0.4 MG SL tablet Place 1 tablet (0.4 mg total) under the tongue every 5 (five) minutes as needed for chest pain. 03/18/20   Lurene Shadow, MD  ticagrelor (BRILINTA) 90 MG TABS tablet Take 1 tablet (90 mg total) by mouth 2 (two) times daily. 10/03/20   Iran Ouch, MD   Review of Systems    He continues to have intermittent sharp chest discomfort similar in fashion to what he has been experiencing ever since his myocardial infarction.  This seems to be exacerbated by stress and anxiety related to his separation from his wife.  He had one episode associated dyspnea resolved with nitroglycerin.  He has been exercising regularly without difficulty.  He denies palpitations, PND,  orthopnea, dizziness, syncope, edema, or early satiety..  All other systems reviewed and are otherwise negative except as noted above.  Physical Exam    VS:  BP 108/78   Pulse (!) 55   Ht 5\' 11"  (1.803 m)   Wt 235 lb (106.6 kg)   BMI 32.78 kg/m  , BMI Body mass index is 32.78 kg/m. GEN: Well nourished, well developed, in no acute distress. HEENT: normal. Neck: Supple, no JVD, carotid bruits, or masses. Cardiac: RRR, no murmurs, rubs, or gallops. No clubbing, cyanosis, edema.  Radials/PT 2+ and equal bilaterally.  Respiratory:  Respirations regular and unlabored, clear to auscultation bilaterally. GI: Soft, nontender, nondistended, BS + x 4. MS: no deformity or atrophy. Skin: warm and dry, no rash. Neuro:  Strength and sensation are intact. Psych: Normal affect.  Accessory Clinical Findings     ECG personally reviewed by me today -sinus bradycardia with sinus arrhythmia, 55, prior inferior infarct- no acute changes.  Lab Results  Component Value Date   WBC 6.7 08/18/2020   HGB 12.4 (L) 08/18/2020   HCT 36.8 (L) 08/18/2020   MCV 84.4 08/18/2020   PLT 187 08/18/2020   Lab Results  Component Value Date   CREATININE 1.08 08/18/2020   BUN 17 08/18/2020   NA 138 08/18/2020   K 3.1 (L) 08/18/2020   CL 106 08/18/2020   CO2 17 (L) 08/18/2020   Lab Results  Component Value Date   ALT 17 08/18/2020   AST 17 08/18/2020   ALKPHOS 48 08/18/2020   BILITOT 4.2 (H) 08/18/2020   Lab Results  Component Value Date   CHOL 174 06/17/2020   HDL 54 06/17/2020   LDLCALC 58 06/17/2020   LDLDIRECT 126 (H) 05/11/2020   TRIG 312 (H) 06/17/2020   CHOLHDL 3.2 06/17/2020    Lab Results  Component Value Date   HGBA1C 7.0 (H) 03/01/2020    Assessment & Plan    1.  Coronary artery disease/sharp chest pain: Status post inferior STEMI in June 2021 with finding of occluded right coronary artery and left to right collaterals.  Attempted PCI was unsuccessful and he has been medically managed since.  Since his event, he has had intermittent sharp chest pain, lasting up to 30 minutes.  He has not had any recurrent angina, similar to prior MI, which she describes as heaviness and tightness.  He recently completed an alcohol detoxification program has been exercising regularly without symptoms or limitations.  He did have one episode of chest pain in the setting of emotional upset after speaking with his wife, from whom he is currently separated, which resolved with nitroglycerin.  He feels that anxiety is likely driving his occasional symptoms at this point and overall, symptoms are less frequent and less severe than what they had been in the Fall 2021.  We agreed to continue medical therapy including aspirin, statin, beta-blocker, Brilinta, and Vascepa therapy.  We did discuss that if he has any  escalation of symptoms, we could consider addition of long-acting nitrate, though pressure is somewhat soft at 108/78 today.  He will continue to exercise and focus on losing weight (down approximately 20 pounds since November 2021).  2.  Ischemic cardiomyopathy/heart failure with improved ejection fraction: EF was 35 to 40% at the time of his MI but improved to 50% by echo in July.  He has been exercising regularly without symptoms or limitations and is euvolemic on examination today.  He remains on beta-blocker therapy.  3.  Essential hypertension:  Stable on beta-blocker therapy.  4.  Hyperlipidemia/hypertriglyceridemia: LDL of 58 in October with triglycerides of 312, which was down from 543 in September.  He remains on high potency statin and Vascepa therapy.  He has ceased alcohol and hopefully triglycerides will improve further.  5.  Paroxysmal atrial fibrillation/flutter: Brief episode of paroxysmal atrial fibrillation at the time of his MI with readmission in July 2021 secondary to atrial flutter.  He has not been on oral anticoagulation in the setting of need for dual antiplatelet therapy and short duration of atrial arrhythmias previously.  He has not been having any recurrent palpitations or tachycardia.  He remains on beta-blocker therapy.  CHA2DS2-VASc equals 4.  If he were to experience recurrent atrial arrhythmias, we have a low threshold to place on oral anticoagulation and potentially refer to electrophysiology.  6.  Alcohol abuse: He recently finished the detox program and has been alcohol free for approximately 130 days.  I congratulated him on this.  He has been experiencing some depression and anxiety will follow up with his primary care provider as well as his counselors.  7.  Disposition: Follow-up in clinic in approximately 4 months or sooner if necessary.   Nicolasa Ducking, NP 11/01/2020, 4:13 PM

## 2020-11-01 NOTE — Telephone Encounter (Signed)
Patient brought in more [employer] forms stating they did not receive the proper records. Courtney from [employer]/medical records was contacted and sent the forms. Forms also scanned in as well. Patient made aware and given the phone number for [employer] to follow up if he would like

## 2020-11-01 NOTE — Patient Instructions (Signed)
Medication Instructions:  No changes  *If you need a refill on your cardiac medications before your next appointment, please call your pharmacy*   Lab Work: None  If you have labs (blood work) drawn today and your tests are completely normal, you will receive your results only by: Marland Kitchen MyChart Message (if you have MyChart) OR . A paper copy in the mail If you have any lab test that is abnormal or we need to change your treatment, we will call you to review the results.   Testing/Procedures: None   Follow-Up: At [facility], you and your health needs are our priority.  As part of our continuing mission to provide you with exceptional heart care, we have created designated Provider Care Teams.  These Care Teams include your primary Cardiologist (physician) and Advanced Practice Providers (APPs -  Physician Assistants and Nurse Practitioners) who all work together to provide you with the care you need, when you need it.  We recommend signing up for the patient portal called "MyChart".  Sign up information is provided on this After Visit Summary.  MyChart is used to connect with patients for Virtual Visits (Telemedicine).  Patients are able to view lab/test results, encounter notes, upcoming appointments, etc.  Non-urgent messages can be sent to your provider as well.   To learn more about what you can do with MyChart, go to [email or web address].    Your next appointment:   4 month(s)  The format for your next appointment:   In Person  Provider:   Lorine Bears, MD

## 2020-11-02 ENCOUNTER — Ambulatory Visit: Payer: Commercial Managed Care - PPO | Admitting: Nurse Practitioner

## 2020-11-23 NOTE — Telephone Encounter (Signed)
Patient calling in to state that his employers nurse at [employer], never received the forms that were faxed back in February to clear him for work. Please advise patient on status of form as it is not scanned into chart Patient has seen multiple providers during this process

## 2020-11-23 NOTE — Telephone Encounter (Signed)
Spoke with patient and reviewed that his forms are under review and once completed we can fax them if needed. He verbalized understanding with no further questions at this time.

## 2020-11-23 NOTE — Telephone Encounter (Signed)
Forms provided to Ward Givens NP for review.

## 2020-11-23 NOTE — Telephone Encounter (Signed)
Patient seen by Ward Givens, NP. The form has been sitting on Chris's desk. Form given to his nurse Desma Maxim, RN to address.

## 2020-11-24 NOTE — Telephone Encounter (Signed)
Left voicemail message on patients number to call back if there are any additional numbers we should send this form with return number and name.

## 2020-11-24 NOTE — Telephone Encounter (Signed)
Spoke with Laureen Abrahams RN at [employer] to let her know that form has been completed and just needed fax number to send that. She provided me with Fax # [phone]. Will also reach out to patient to see if there was any other fax number as well. Advised I would get this faxed to her at some point today. She was appreciative for the call with no further questions at this time.

## 2020-11-24 NOTE — Telephone Encounter (Signed)
Left voicemail message that forms have been completed and we just need fax number so that we can send these to them. Left return number and name for her to call back.

## 2020-11-24 NOTE — Telephone Encounter (Signed)
Spoke with patient and reviewed that I have fax number for [facility] RN and would send to that number but wanted to make sure if he needed it sent somewhere else. He denies sending it to anywhere else so will fax then mail original copy to patient. I will make copy and place in my file box under "Faxed" in case something else should need to be done. Patient confirmed mailing address and will put that in outgoing mail. He verbalized understanding of our conversation, agreement with plan, and had no further questions at this time.

## 2021-02-22 IMAGING — DX DG CHEST 1V PORT
1 series · 1 of 1 positions shown · non-contrast
Comparison: 02/29/2020

CLINICAL DATA: Shortness of breath, sudden onset

EXAM:
PORTABLE CHEST 1 VIEW

[chest ap]
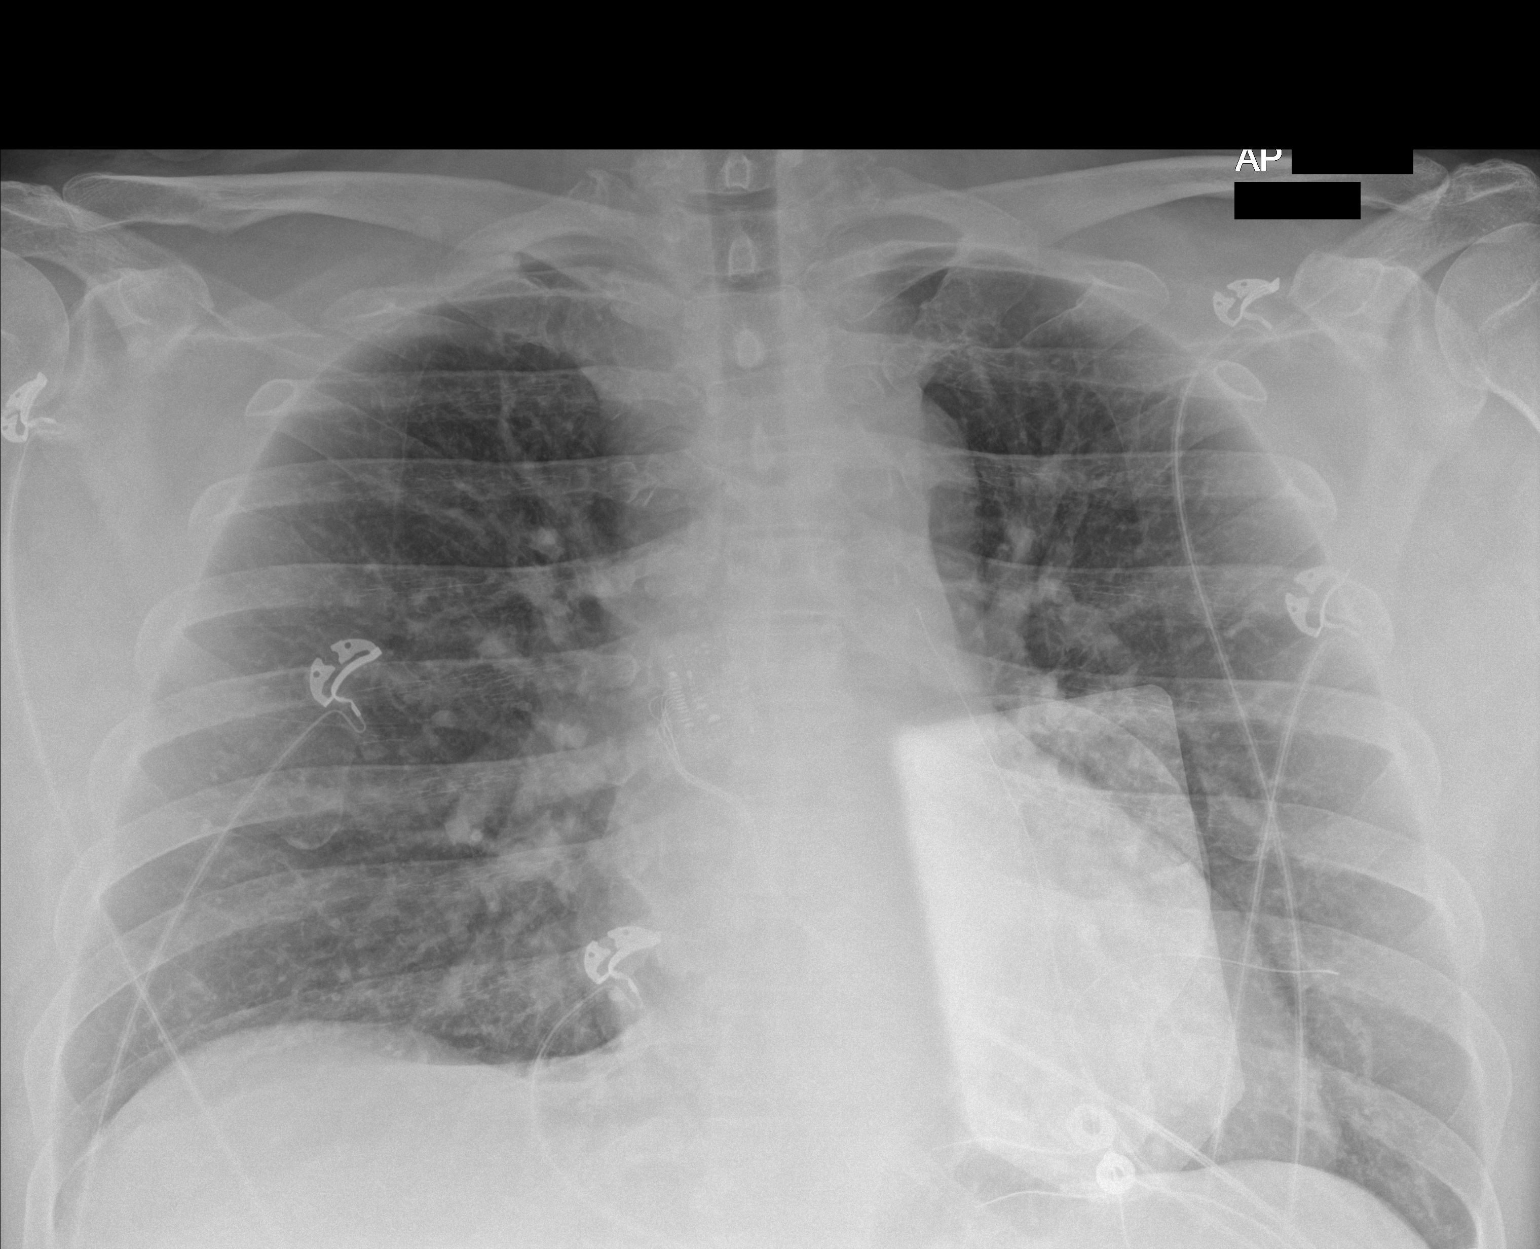

[1 of 1 positions shown; findings below may reference images not displayed]

FINDINGS: Artifact from EKG leads and defibrillator pads. Mild prominence of
lung markings without Kerley lines, likely accentuated by lower
volumes. No consolidation or pneumothorax. No pleural effusion.
Normal heart size
IMPRESSION: Negative portable chest.

## 2021-03-01 ENCOUNTER — Other Ambulatory Visit: Payer: Self-pay | Admitting: Cardiovascular Disease

## 2021-03-07 ENCOUNTER — Encounter: Payer: Self-pay | Admitting: Cardiovascular Disease

## 2021-03-07 ENCOUNTER — Other Ambulatory Visit: Payer: Self-pay

## 2021-03-07 ENCOUNTER — Ambulatory Visit (INDEPENDENT_AMBULATORY_CARE_PROVIDER_SITE_OTHER): Payer: Commercial Managed Care - PPO | Admitting: Cardiovascular Disease

## 2021-03-07 DIAGNOSIS — F1010 Alcohol abuse, uncomplicated: Secondary | ICD-10-CM

## 2021-03-07 DIAGNOSIS — E785 Hyperlipidemia, unspecified: Secondary | ICD-10-CM | POA: Diagnosis not present

## 2021-03-07 DIAGNOSIS — I480 Paroxysmal atrial fibrillation: Secondary | ICD-10-CM

## 2021-03-07 DIAGNOSIS — I4892 Unspecified atrial flutter: Secondary | ICD-10-CM

## 2021-03-07 DIAGNOSIS — I2510 Atherosclerotic heart disease of native coronary artery without angina pectoris: Secondary | ICD-10-CM | POA: Diagnosis not present

## 2021-03-07 DIAGNOSIS — E781 Pure hyperglyceridemia: Secondary | ICD-10-CM

## 2021-03-07 DIAGNOSIS — I255 Ischemic cardiomyopathy: Secondary | ICD-10-CM | POA: Diagnosis not present

## 2021-03-07 MED ORDER — CLOPIDOGREL BISULFATE 75 MG PO TABS: 75.0000 mg | ORAL_TABLET | Freq: Every day | ORAL | 1 refills | Status: AC

## 2021-03-07 MED ORDER — ICOSAPENT ETHYL 1 G PO CAPS
2.0000 g | ORAL_CAPSULE | Freq: Two times a day (BID) | ORAL | 1 refills | Status: DC
Start: 2021-03-07 — End: 2021-07-28

## 2021-03-07 NOTE — Patient Instructions (Signed)
Medication Instructions:  Your physician has recommended you make the following change in your medication:  1) STOP Brilinta  2) START Plavix (clopidogrel) 75 mg daily. An Rx has been sent to your pharmacy  Vascepa has been refilled today.  *If you need a refill on your cardiac medications before your next appointment, please call your pharmacy*   Lab Work: Your physician recommends that you return for a FASTING lipid profile and LFT: 6 months.  Please come fasting to your next appt so that your labs can be drawn that day  If you have labs (blood work) drawn today and your tests are completely normal, you will receive your results only by: MyChart Message (if you have MyChart) OR A paper copy in the mail If you have any lab test that is abnormal or we need to change your treatment, we will call you to review the results.   Testing/Procedures: None ordered   Follow-Up: At [facility], you and your health needs are our priority.  As part of our continuing mission to provide you with exceptional heart care, we have created designated Provider Care Teams.  These Care Teams include your primary Cardiologist (physician) and Advanced Practice Providers (APPs -  Physician Assistants and Nurse Practitioners) who all work together to provide you with the care you need, when you need it.  We recommend signing up for the patient portal called "MyChart".  Sign up information is provided on this After Visit Summary.  MyChart is used to connect with patients for Virtual Visits (Telemedicine).  Patients are able to view lab/test results, encounter notes, upcoming appointments, etc.  Non-urgent messages can be sent to your provider as well.   To learn more about what you can do with MyChart, go to [email or web address].    Your next appointment:   Your physician wants you to follow-up in: 6 months You will receive a reminder letter in the mail two months in advance. If you don't receive a  letter, please call our office to schedule the follow-up appointment.  (Appt to be scheduled in the morning to allow fasting labs to be done)   The format for your next appointment:   In Person  Provider:   You may see Lorine Bears, MD or one of the following Advanced Practice Providers on your designated Care Team:   Nicolasa Ducking, NP Eula Listen, PA-C Marisue Ivan, PA-C Cadence [place], [place] Gillian Shields, NP   Other Instructions

## 2021-03-07 NOTE — Progress Notes (Signed)
Cardiology Office Note   Date:  03/07/2021   ID:  Joshua Holder, DOB March 11, 1966, MRN [id number]  PCP:  System, Provider Not In  Cardiologist:  Lorine Bears, MD   Chief Complaint  Patient presents with   Other    4 month f/u no complaints today. Meds reviewed verbally with pt.      History of Present Illness: Joshua Holder is a 55 y.o. male who presents for a follow-up visit regarding coronary artery disease, chronic systolic heart failure and paroxysmal atrial fibrillation.  He has chronic medical conditions that include type 2 diabetes, essential hypertension, hyperlipidemia, gout and previous history of alcohol abuse. He was hospitalized in June 2021 with late presenting inferior ST elevation myocardial infarction.  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  Given large thrombus burden and the presence of left-to-right collaterals, I did not place a stent and the patient was treated medically.  EF was 35 to 40%.  Post MI course was complicated by brief atrial fibrillation.  He also had symptoms suggestive of pericarditis that improved with colchicine. He was rehospitalized in July 2021 with atrial flutter that resolved with IV diltiazem.  Echocardiogram showed an EF of 50% with grade 2 diastolic dysfunction and mild to moderate mitral regurgitation.  The patient had marital problems related to his excessive alcohol use.  He separated from his wife as a result and was under significant stress.  He attended alcohol detox program and is currently trying to reconcile with his wife. He reports no chest pain.  He has stable exertional dyspnea.  Past Medical History:  Diagnosis Date   Atrial fibrillation with RVR (HCC) 02/29/2020   a. <48 hours in setting of  inferior STEMI   Atrial flutter (HCC)    a. 03/2020 - converted w/ IV dilt in ED; b. 03/2020 [facility]: no recurrent Afib/flutter. 1 run of SVT (9 beats, 125 bpm). Rare PACs/PVCs. Triggered events assoc w/ sinus tachycardia.   Combined systolic and diastolic heart failure (HCC) 02/29/2020   Coronary artery disease 02/29/2020   a. 02/29/20 late presenting inferior STEMI thrombotic occlusion RCA with L-R collaterals. PTCA attempted but unsuccessful-->Med Rx.   Diabetes 1.5, managed as type 2 (HCC)    Gout    HLD (hyperlipidemia) 02/29/2020   Hypertension    Ischemic cardiomyopathy 02/29/2020   a. EF during cath 03/01/20 35-40%; b. Echo 03/01/20 EF 50-55%; c. Echo 03/17/20 EF 50%, gr2DD   MI (myocardial infarction) [facility])     Past Surgical History:  Procedure Laterality Date   CORONARY/GRAFT ACUTE MI REVASCULARIZATION N/A 03/01/2020   Procedure: Coronary/Graft Acute MI Revascularization;  Surgeon: Iran Ouch, MD;  Location: [facility] INVASIVE CV LAB;  Service: Cardiovascular;  Laterality: N/A;   INCISION AND DRAINAGE Left 12/30/2018   Procedure: INCISION AND DRAINAGE LEFT ELBOW;  Surgeon: Juanell Fairly, MD;  Location: [facility] ORS;  Service: Orthopedics;  Laterality: Left;   LEFT HEART CATH AND CORONARY ANGIOGRAPHY N/A 03/01/2020   Procedure: LEFT HEART CATH AND CORONARY ANGIOGRAPHY;  Surgeon: Iran Ouch, MD;  Location: [facility] INVASIVE CV LAB;  Service: Cardiovascular;  Laterality: N/A;     Current Outpatient Medications  Medication Sig Dispense Refill   aspirin EC 81 MG tablet Take 1 tablet (81 mg) by mouth once daily. Swallow whole.     atorvastatin (  LIPITOR) 80 MG tablet Take 1 tablet (80 mg total) by mouth daily. 90 tablet 1   diclofenac Sodium (VOLTAREN) 1 % GEL Apply 2 g topically 4 (four) times daily. 150 g 0   icosapent Ethyl (VASCEPA) 1 g capsule Take 2 capsules (2 g total) by mouth 2 (two) times daily. 360 capsule 1   Magnesium Oxide 400 MG CAPS Take 1 capsule (400 mg total) by mouth  daily.  0   metoprolol tartrate (LOPRESSOR) 25 MG tablet TAKE 1 TABLET(25 MG) BY MOUTH TWICE DAILY 180 tablet 0   Multiple Vitamins-Minerals (CENTRUM SILVER 50+MEN PO) Take 1 tablet by mouth daily.     nitroGLYCERIN (NITROSTAT) 0.4 MG SL tablet Place 1 tablet (0.4 mg total) under the tongue every 5 (five) minutes as needed for chest pain. 30 tablet 0   ticagrelor (BRILINTA) 90 MG TABS tablet Take 1 tablet (90 mg total) by mouth 2 (two) times daily. 90 tablet 1   No current facility-administered medications for this visit.    Allergies:   Lisinopril    Social History:  The patient  reports that he has never smoked. He has never used smokeless tobacco. He reports current alcohol use. He reports previous drug use.   Family History:  The patient's family history includes Heart attack in his father.    ROS:  Please see the history of present illness.   Otherwise, review of systems are positive for none.   All other systems are reviewed and negative.    PHYSICAL EXAM: VS:  BP 120/90 (BP Location: Left Arm, Patient Position: Sitting, Cuff Size: Normal)   Pulse 67   Ht 5\' 11"  (1.803 m)   Wt 259 lb (117.5 kg)   SpO2 98%   BMI 36.12 kg/m  , BMI Body mass index is 36.12 kg/m. GEN: Well nourished, well developed, in no acute distress  HEENT: normal  Neck: no JVD, carotid bruits, or masses Cardiac: RRR; no murmurs, rubs, or gallops,no edema  Respiratory:  clear to auscultation bilaterally, normal work of breathing GI: soft, nontender, nondistended, + BS MS: no deformity or atrophy  Skin: warm and dry, no rash Neuro:  Strength and sensation are intact Psych: euthymic mood, full affect   EKG:  EKG is not ordered today.    Recent Labs: 03/17/2020: TSH 0.786 03/23/2020: Magnesium 1.6 08/18/2020: ALT 17; BUN 17; Creatinine, Ser 1.08; Hemoglobin 12.4; Platelets 187; Potassium 3.1; Sodium 138    Lipid Panel    Component Value Date/Time   CHOL 174 06/17/2020 0911   CHOL 238 (H)  05/11/2020 1535   TRIG 312 (H) 06/17/2020 0911   HDL 54 06/17/2020 0911   HDL 45 05/11/2020 1535   CHOLHDL 3.2 06/17/2020 0911   VLDL 62 (H) 06/17/2020 0911   LDLCALC 58 06/17/2020 0911   LDLCALC 101 (H) 05/11/2020 1535   LDLDIRECT 126 (H) 05/11/2020 1535      Wt Readings from Last 3 Encounters:  03/07/21 259 lb (117.5 kg)  11/01/20 235 lb (106.6 kg)  08/18/20 260 lb (117.9 kg)       No flowsheet data found.    ASSESSMENT AND PLAN:  1.  Coronary artery disease involving native coronary arteries without angina: He is doing reasonably well at the present time.  Given large thrombus burden noted during cardiac catheterization and aneurysmal right coronary artery, I am planning to keep him on long-term dual antiplatelet therapy.  I elected to switch him from Brilinta to clopidogrel today.  2.  Chronic  systolic heart failure due to ischemic cardiomyopathy: Most recent ejection fraction was 50%.  He appears to be euvolemic.  Continue treatment with metoprolol.  3.  Essential hypertension: Blood pressures controlled  4.  Mixed hyperlipidemia: He is known to have significantly elevated triglyceride but most likely in the setting of excessive alcohol use.  I recommend continuing high-dose atorvastatin and Vascepa and we will plan on a fasting lipid and liver profile with next office visit.  5.  Paroxysmal atrial fibrillation: This was post myocardial infarction.  No anticoagulation for now unless he develops recurrent episodes.  For now, we will keep him on dual antiplatelet therapy given his coronary artery disease.    Disposition:   FU with me in 6 months  Signed,  Lorine Bears, MD  03/07/2021 4:29 PM    [facility]

## 2021-03-16 ENCOUNTER — Telehealth: Payer: Self-pay | Admitting: Cardiovascular Disease

## 2021-03-16 NOTE — Telephone Encounter (Signed)
Message routed to Dr. Arida to advise. 

## 2021-03-16 NOTE — Telephone Encounter (Signed)
Pt c/o medication issue:  1. Name of Medication: nitro  2. How are you currently taking this medication (dosage and times per day)? Rx is prn but has not taken   3. Are you having a reaction (difficulty breathing--STAT)? no  4. What is your medication issue? Patient unable to get Cialis from urology until nitro discontinued   Patient requesting nitro be discontinued   Patient see Dr. Clide Dales At [facility] [phone]

## 2021-03-17 NOTE — Telephone Encounter (Signed)
Attempted to call the telephone number provided by the patient for his urologist Dr. Clide Dales. The phone rings out with no answer and no office message.  Called the patient. Patient made aware that Dr. Kirke Corin was ok with d/c Nitro and that it has been removed from his medications list. Adv the patient that I attempted to contact his urologist unsuccessfully. Adv him that he can update Dr. Clide Dales of the change. Wake should be able to access the documentation in care everywhere. Patient is to contact our office if further assistance is needed.  Patient verbalized understanding and voiced appreciation for the call.

## 2021-03-17 NOTE — Telephone Encounter (Signed)
Discontinue nitroglycerin.

## 2021-03-20 ENCOUNTER — Ambulatory Visit: Payer: Commercial Managed Care - PPO | Admitting: Family Medicine

## 2021-04-13 NOTE — Telephone Encounter (Signed)
Patient calling back to check status of note to urology.  Notified of below and obtained ov and fax number.  Confirmed fax with office and faxed note.

## 2021-05-02 ENCOUNTER — Telehealth: Payer: Self-pay | Admitting: Cardiovascular Disease

## 2021-05-02 NOTE — Telephone Encounter (Signed)
   Patient Name: Joshua Holder  DOB: 11-Feb-1966 MRN: [id number]  Primary Cardiologist: Lorine Bears, MD  Chart reviewed as part of pre-operative protocol coverage.   I spoke with patient today who reports he is having 1 tooth extracted today then meeting with a dental surgeon on 8/29 to discuss having multiple additional teeth extracted. The patient affirms he has been doing well without any new cardiac symptoms. Therefore, based on ACC/AHA guidelines, the patient would be at acceptable risk for the planned procedure without further cardiovascular testing.   FOR TODAY - GIVEN THIS IS A SIMPLE EXTRACTION OF 1 TOOTH - Simple dental extractions are considered low risk procedures per guidelines and generally do not require any specific cardiac clearance. It is also generally accepted that for simple extractions and dental cleanings, there is no need to interrupt blood thinner therapy therefore would continue ASA/Plavix. The patient affirms he has not been holding these medicines.  FOR MULTIPLE EXTRACTIONS/MORE COMPLEX DENTAL PROCEDURE IN THE NEAR FUTURE: Discussed blood thinner rx with Dr. Kirke Corin who recommends continuing aspirin but feels the patient may hold Plavix for 5 days prior to procedure if necessary.  The patient was advised that if he develops new symptoms prior to surgery to contact our office to arrange for a follow-up visit, and he verbalized understanding.  SBE prophylaxis is not required for the patient from a cardiac standpoint.  I will route this recommendation to the requesting party via Epic fax function and remove from pre-op pool.  Please call with questions.  Laurann Montana, PA-C 05/02/2021, 9:27 AM

## 2021-05-02 NOTE — Telephone Encounter (Signed)
   [facility] Pre-operative Risk Assessment    Patient Name: Jarriel Papillion  DOB: [birth year] MRN: [id number]  HEARTCARE STAFF:  - IMPORTANT!!!!!! Under Visit Info/Reason for Call, type in Other and utilize the format Clearance MM/DD/YY or Clearance TBD. Do not use dashes or single digits. - Please review there is not already an duplicate clearance open for this procedure. - If request is for dental extraction, please clarify the # of teeth to be extracted. - If the patient is currently at the dentist's office, call Pre-Op Callback Staff (MA/nurse) to input urgent request.  - If the patient is not currently in the dentist office, please route to the Pre-Op pool.  Request for surgical clearance:  What type of surgery is being performed? EXTRACTIONS 7 TEETH  When is this surgery scheduled? PREFERABLY TODAY  What type of clearance is required (medical clearance vs. Pharmacy clearance to hold med vs. Both)? BOTH  Are there any medications that need to be held prior to surgery and how long? ASPIRIN, PLAVIX  Practice name and name of physician performing surgery? [facility] , DR  Hayes Ludwig  What is the office phone number? [phone]   7.   What is the office fax number? [phone]  8.   Anesthesia type (None, local, MAC, general) ? LOCAL   Eli Phillips 05/02/2021, 8:49 AM  _________________________________________________________________   (provider comments below)

## 2021-05-02 NOTE — Telephone Encounter (Signed)
   Patient Name: Joshua Holder  DOB: March 28, 1966 MRN: [id number]  Primary Cardiologist: Lorine Bears, MD  Chart reviewed as part of pre-operative protocol coverage - deemed URGENT as dental office requested for procedure of 7 extractions to be done today, but also requesting clearance to hold ASA / Plavix so suspect this procedure will need to be delayed to allow adequate time of holding of medicine. Patient has hx of CAD s/p late presenting inferior MI 02/2020, ICM (Ef improved to 50% 03/2020), mild-moderate MR, HTN, HLD, post-MI PAF (not on OAC). At time of MI, Cardiac catheterization was performed which showed thrombotic occlusion of the proximal right coronary artery with massive thrombus burden. 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 Given large thrombus burden and the presence of left-to-right collaterals, stent was not placed. At OV 02/2021, Brilinta was changed to Plavix with plan for long term DAPT.  I will reach out to Dr. Kirke Corin for input on whether the patient can begin holding DAPT for multiple dental extractions then I will call patient.  Based on records, Sbe ppx not needed for this patient.   Laurann Montana, PA-C 05/02/2021, 8:58 AM

## 2021-07-26 IMAGING — DX DG CHEST 1V PORT
1 series · 1 of 1 positions shown · non-contrast
Comparison: 05/09/2020.

CLINICAL DATA: Shortness of breath.

EXAM:
PORTABLE CHEST 1 VIEW

[chest ap]
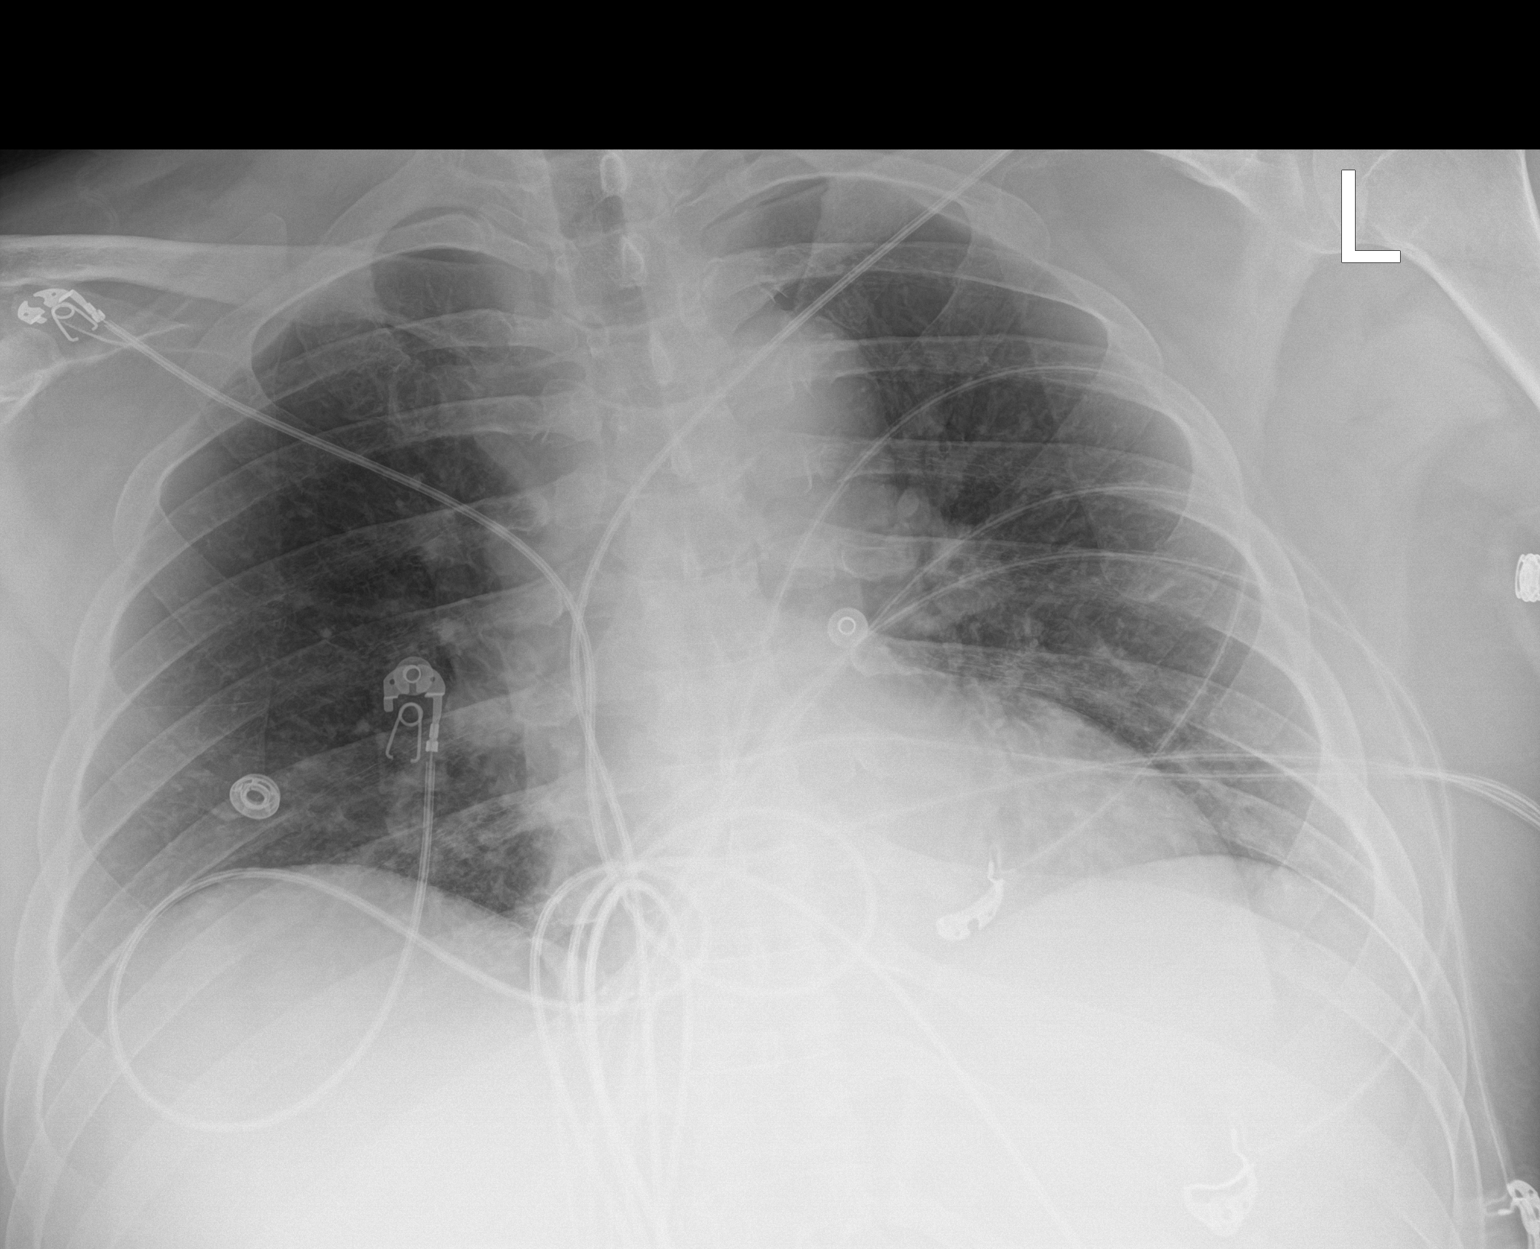

[1 of 1 positions shown; findings below may reference images not displayed]

FINDINGS: Mediastinum hilar structures normal. Borderline cardiomegaly and
pulmonary venous congestion. Low lung volumes with bibasilar
atelectasis. Mild bibasilar interstitial prominence cannot be
excluded. Small bilateral pleural effusions cannot be excluded. No
pneumothorax.
IMPRESSION: 1.  Borderline cardiomegaly and pulmonary venous congestion.

2. Low lung volumes with bibasilar atelectasis. Mild bibasilar
interstitial prominence cannot be excluded. Small bilateral pleural
effusions cannot be excluded.

## 2021-07-26 IMAGING — CT CT ABD-PELV W/ CM
2 of 5 series · 16 of 46 positions shown, 18 images · IV contrast (omnipaque)
Comparison: CT scan 12/29/2015

CLINICAL DATA: Abdominal pain and diarrhea.

EXAM:
CT ABDOMEN AND PELVIS WITH CONTRAST
TECHNIQUE: Multidetector CT imaging of the abdomen and pelvis was performed
using the standard protocol following bolus administration of
intravenous contrast.
CONTRAST:  100mL OMNIPAQUE IOHEXOL 300 MG/ML  SOLN

[Series 3: abdomen 5.0 · axial · 0.98mm/px · z∈[+628,+1048]mm · 13 of 97 slices shown, 15 images]
[im 7/97  soft-tissue]
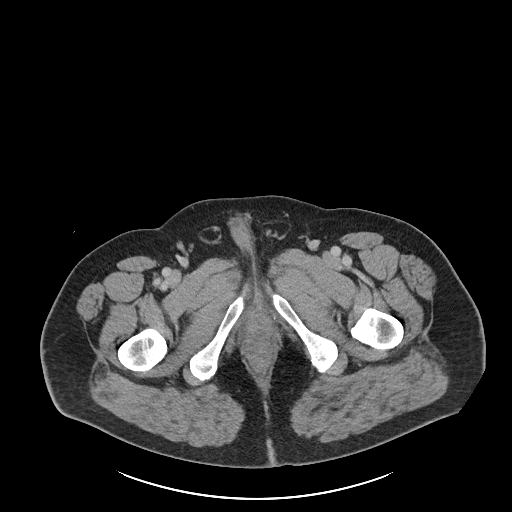
[im 7/97  bone]
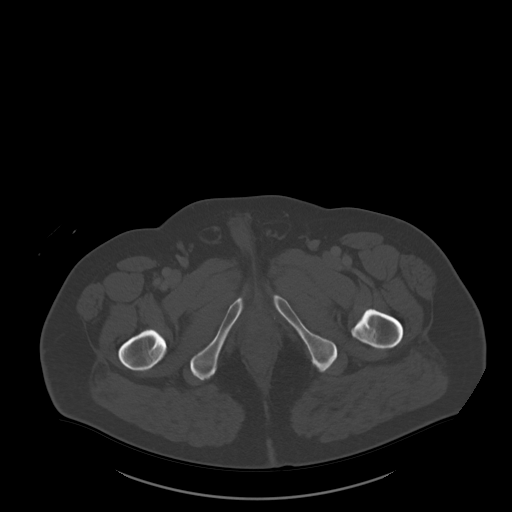
[im 13/97  soft-tissue]
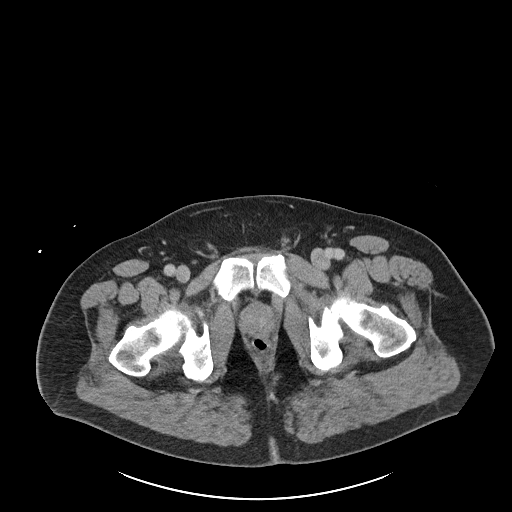
[im 19/97  soft-tissue]
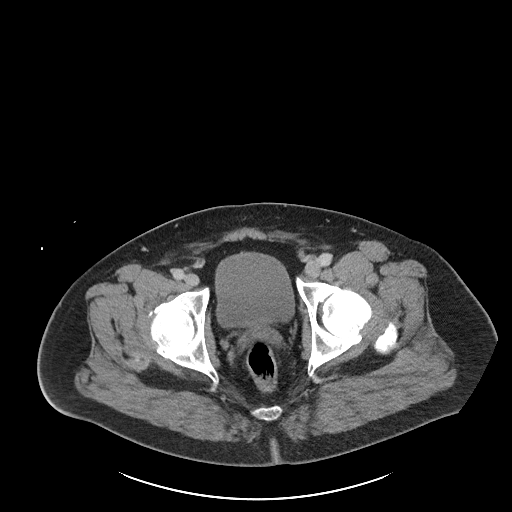
[im 31/97  soft-tissue]
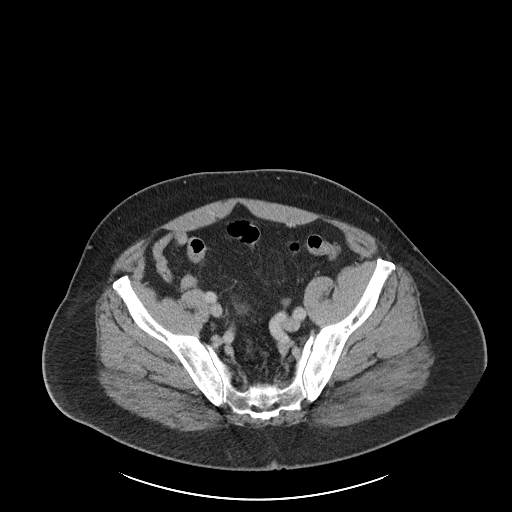
[im 37/97  soft-tissue]
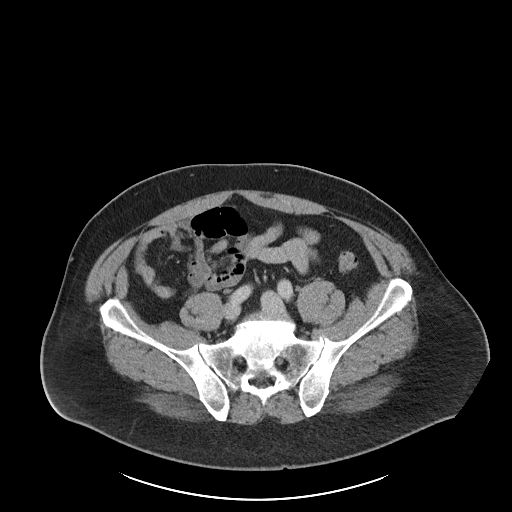
[im 43/97  soft-tissue]
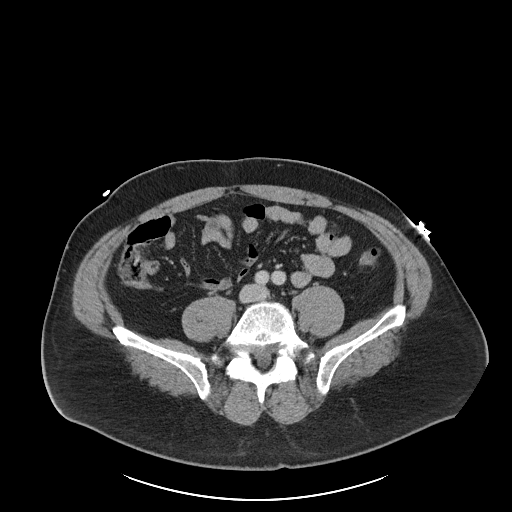
[im 49/97  soft-tissue]
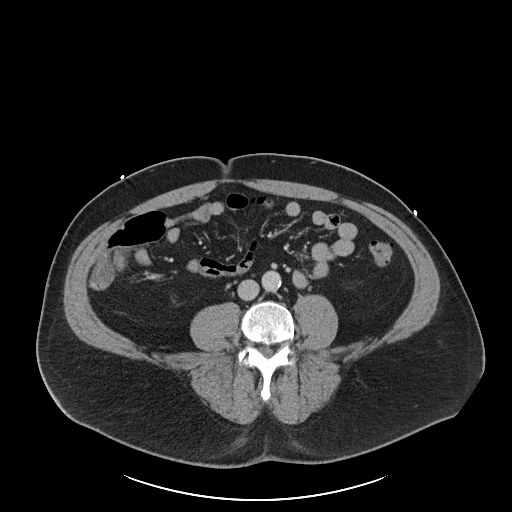
[im 55/97  soft-tissue]
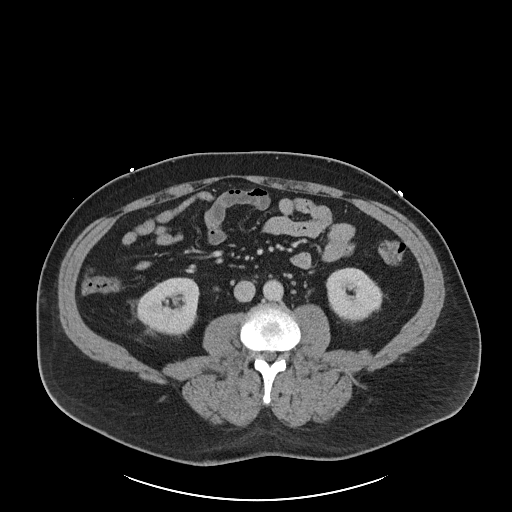
[im 61/97  soft-tissue]
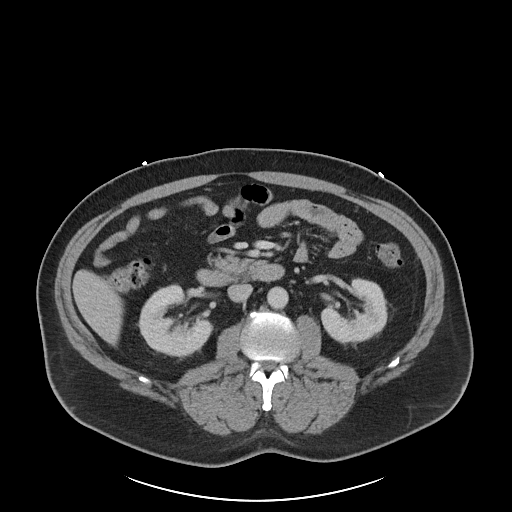
[im 61/97  bone]
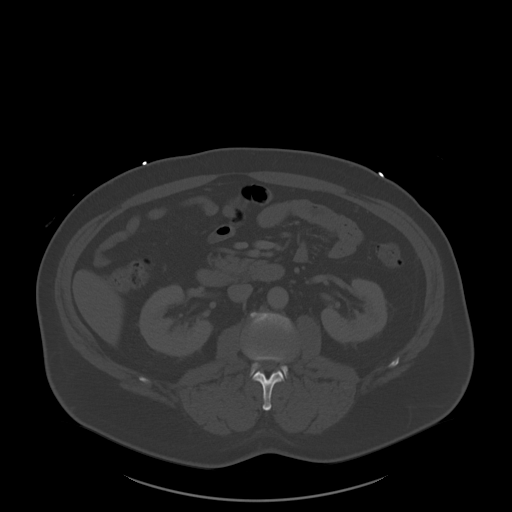
[im 67/97  soft-tissue]
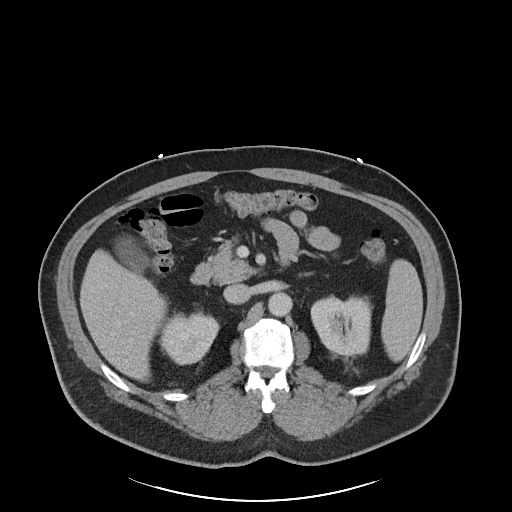
[im 79/97  soft-tissue]
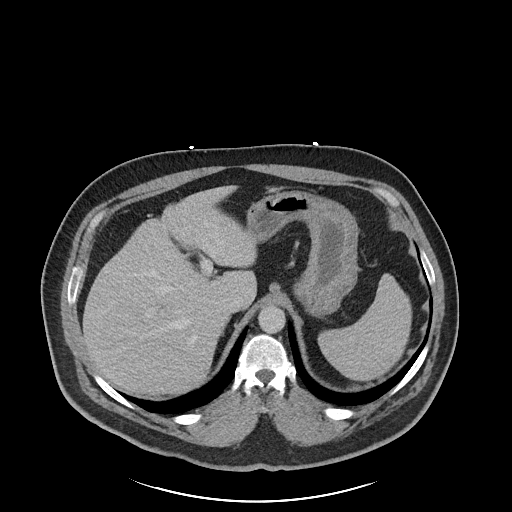
[im 85/97  soft-tissue]
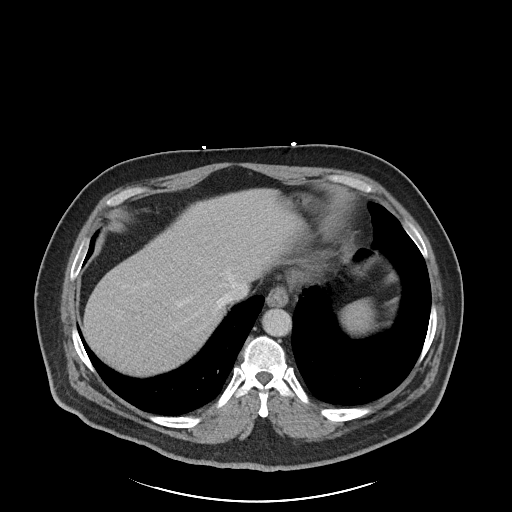
[im 91/97  soft-tissue]
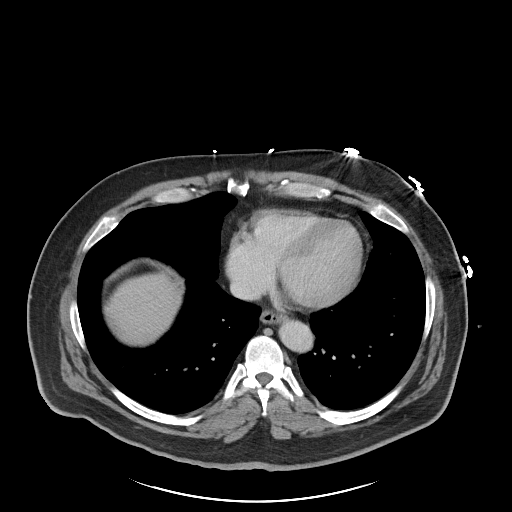

[Series 6: abdomen 3.0 mpr cor · coronal · 0.80mm/px · 3 of 108 slices shown]
[im 36/108  soft-tissue]
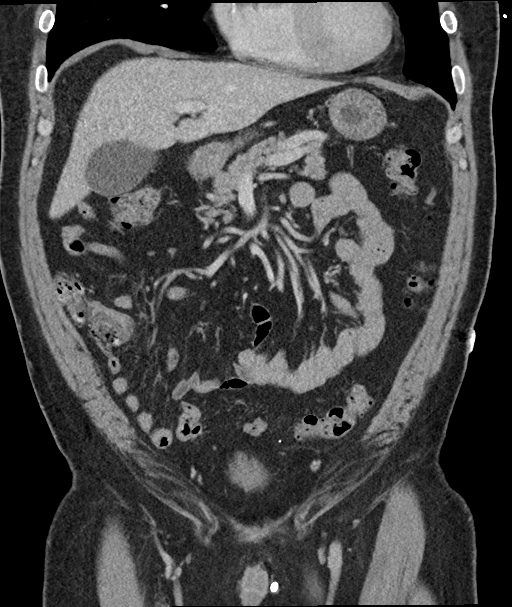
[im 48/108  soft-tissue]
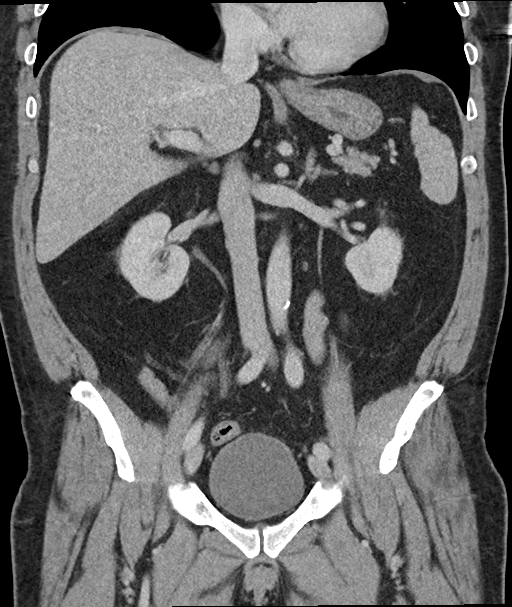
[im 60/108  soft-tissue]
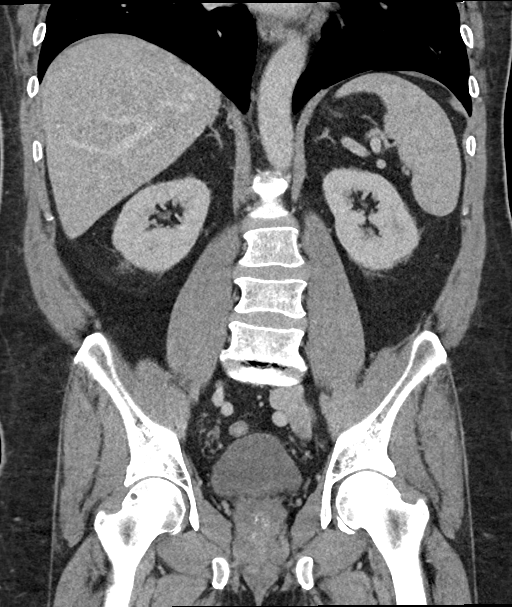

[16 of 46 positions shown; findings below may reference images not displayed]

FINDINGS: Lower chest: Patchy areas of subpleural atelectasis but no
infiltrates or effusions. The heart is normal in size. Age advanced
coronary artery calcifications are noted.

Hepatobiliary: No focal hepatic lesions or intrahepatic biliary
dilatation. The gallbladder is normal. No common bile duct
dilatation.

Pancreas: No mass, inflammation or ductal dilatation.

Spleen: Normal size. No focal lesions.

Adrenals/Urinary Tract: The adrenal glands are normal.

Both kidneys are normal. No renal lesions, renal calculi or
hydronephrosis. No ureteral or bladder calculi.

Stomach/Bowel: The stomach, duodenum, small bowel and colon are
unremarkable. No acute inflammatory changes, mass lesions or
obstructive findings. The terminal ileum is normal. The appendix is
normal.

Vascular/Lymphatic: The aorta is normal in caliber. No dissection.
The branch vessels are patent. The major venous structures are
patent. No mesenteric or retroperitoneal mass or adenopathy. Small
scattered lymph nodes are noted.

Reproductive: The prostate gland and seminal vesicles are
unremarkable.

Other: No pelvic mass or adenopathy. No free pelvic fluid
collections. No inguinal mass or adenopathy. Small periumbilical
abdominal wall hernia containing fat.

Musculoskeletal: No significant bony findings. Bilateral pars
defects are noted at L5 with advanced degenerative disc disease at
L5-S1.
IMPRESSION: 1. No acute abdominal/pelvic findings, mass lesions or adenopathy.
2. Age advanced coronary artery calcifications.

## 2021-07-28 ENCOUNTER — Telehealth: Payer: Self-pay | Admitting: Cardiovascular Disease

## 2021-07-28 MED ORDER — ICOSAPENT ETHYL 1 G PO CAPS: 2.0000 g | ORAL_CAPSULE | Freq: Two times a day (BID) | ORAL | 0 refills | Status: AC

## 2021-07-28 NOTE — Telephone Encounter (Signed)
Requested Prescriptions   Signed Prescriptions Disp Refills   icosapent Ethyl (VASCEPA) 1 g capsule 360 capsule 0    Sig: Take 2 capsules (2 g total) by mouth 2 (two) times daily.    Authorizing Provider: Lorine Bears A    Ordering User: Kendrick Fries

## 2021-07-28 NOTE — Telephone Encounter (Signed)
*  STAT* If patient is at the pharmacy, call can be transferred to refill team.   1. Which medications need to be refilled? (please list name of each medication and dose if known)   vescepa  2. Which pharmacy/location (including street and city if local pharmacy) is medication to be sent to? [facility] [place]  3. Do they need a 30 day or 90 day supply? 90

## 2021-09-11 ENCOUNTER — Other Ambulatory Visit: Payer: Self-pay | Admitting: Cardiovascular Disease

## 2021-09-12 NOTE — Telephone Encounter (Signed)
Mailbox is Full cant LVM

## 2021-09-12 NOTE — Telephone Encounter (Signed)
Please contact pt for future appointment. ?Pt due for 6 month f/u. ?Pt needing refills. ?

## 2021-09-13 ENCOUNTER — Other Ambulatory Visit: Payer: Self-pay

## 2021-09-13 MED ORDER — ATORVASTATIN CALCIUM 80 MG PO TABS: 80.0000 mg | ORAL_TABLET | Freq: Every day | ORAL | 0 refills | Status: AC

## 2021-10-03 NOTE — Telephone Encounter (Signed)
Attempted to schedule no ans no vm  

## 2022-01-03 NOTE — Telephone Encounter (Signed)
Attempted to schedule no ans no vm  

## 2022-01-18 NOTE — Telephone Encounter (Signed)
Lvm to schedule
